# Patient Record
Sex: Female | Born: 1975 | Race: Asian | Hispanic: No | Marital: Married | Smoking: Never smoker
Health system: Southern US, Community
[De-identification: ages and names within clinical notes are randomized; demographics above are authoritative.]

## PROBLEM LIST (undated history)

## (undated) DIAGNOSIS — I10 Essential (primary) hypertension: Secondary | ICD-10-CM

## (undated) HISTORY — DX: Essential (primary) hypertension: I10

---

## 2016-06-12 ENCOUNTER — Inpatient Hospital Stay (HOSPITAL_COMMUNITY): Payer: Self-pay

## 2016-06-12 ENCOUNTER — Encounter (HOSPITAL_COMMUNITY): Payer: Self-pay | Admitting: Emergency Medicine

## 2016-06-12 ENCOUNTER — Emergency Department (HOSPITAL_COMMUNITY): Payer: Self-pay

## 2016-06-12 ENCOUNTER — Inpatient Hospital Stay (HOSPITAL_COMMUNITY)
Admission: EM | Admit: 2016-06-12 | Discharge: 2016-06-17 | DRG: 291 | Disposition: A | Discharge status: Home / Self Care | Payer: Self-pay | Attending: Internal Medicine | Admitting: Internal Medicine

## 2016-06-12 DIAGNOSIS — R32 Unspecified urinary incontinence: Secondary | ICD-10-CM | POA: Diagnosis present

## 2016-06-12 DIAGNOSIS — Z9114 Patient's other noncompliance with medication regimen: Secondary | ICD-10-CM

## 2016-06-12 DIAGNOSIS — I509 Heart failure, unspecified: Secondary | ICD-10-CM

## 2016-06-12 DIAGNOSIS — I429 Cardiomyopathy, unspecified: Secondary | ICD-10-CM | POA: Diagnosis present

## 2016-06-12 DIAGNOSIS — I161 Hypertensive emergency: Secondary | ICD-10-CM | POA: Diagnosis present

## 2016-06-12 DIAGNOSIS — I10 Essential (primary) hypertension: Secondary | ICD-10-CM | POA: Diagnosis present

## 2016-06-12 DIAGNOSIS — I493 Ventricular premature depolarization: Secondary | ICD-10-CM

## 2016-06-12 DIAGNOSIS — I130 Hypertensive heart and chronic kidney disease with heart failure and stage 1 through stage 4 chronic kidney disease, or unspecified chronic kidney disease: Principal | ICD-10-CM | POA: Diagnosis present

## 2016-06-12 DIAGNOSIS — J810 Acute pulmonary edema: Secondary | ICD-10-CM | POA: Diagnosis present

## 2016-06-12 DIAGNOSIS — I5021 Acute systolic (congestive) heart failure: Secondary | ICD-10-CM | POA: Diagnosis present

## 2016-06-12 DIAGNOSIS — T502X5A Adverse effect of carbonic-anhydrase inhibitors, benzothiadiazides and other diuretics, initial encounter: Secondary | ICD-10-CM | POA: Diagnosis not present

## 2016-06-12 DIAGNOSIS — R778 Other specified abnormalities of plasma proteins: Secondary | ICD-10-CM | POA: Diagnosis present

## 2016-06-12 DIAGNOSIS — N179 Acute kidney failure, unspecified: Secondary | ICD-10-CM | POA: Diagnosis present

## 2016-06-12 DIAGNOSIS — I427 Cardiomyopathy due to drug and external agent: Secondary | ICD-10-CM

## 2016-06-12 DIAGNOSIS — N189 Chronic kidney disease, unspecified: Secondary | ICD-10-CM | POA: Diagnosis present

## 2016-06-12 DIAGNOSIS — Z6841 Body Mass Index (BMI) 40.0 and over, adult: Secondary | ICD-10-CM

## 2016-06-12 DIAGNOSIS — N19 Unspecified kidney failure: Secondary | ICD-10-CM

## 2016-06-12 DIAGNOSIS — J9601 Acute respiratory failure with hypoxia: Secondary | ICD-10-CM | POA: Diagnosis present

## 2016-06-12 LAB — CBC WITH DIFFERENTIAL/PLATELET
BASOS PCT: 0 %
BASOS PCT: 0 %
Basophils Absolute: 0 10*3/uL (ref 0.0–0.1)
Basophils Absolute: 0 10*3/uL (ref 0.0–0.1)
EOS ABS: 0.5 10*3/uL (ref 0.0–0.7)
EOS ABS: 0 10*3/uL (ref 0.0–0.7)
EOS PCT: 0 %
Eosinophils Relative: 2 %
HCT: 52.7 % — ABNORMAL HIGH (ref 36.0–46.0)
HCT: 45.3 % (ref 36.0–46.0)
HEMOGLOBIN: 17.2 g/dL — AB (ref 12.0–15.0)
HEMOGLOBIN: 15.1 g/dL — AB (ref 12.0–15.0)
LYMPHS ABS: 10.7 10*3/uL — AB (ref 0.7–4.0)
LYMPHS ABS: 1.6 10*3/uL (ref 0.7–4.0)
LYMPHS PCT: 48 %
Lymphocytes Relative: 10 %
MCH: 30.3 pg (ref 26.0–34.0)
MCH: 30.3 pg (ref 26.0–34.0)
MCHC: 32.6 g/dL (ref 30.0–36.0)
MCHC: 33.3 g/dL (ref 30.0–36.0)
MCV: 92.9 fL (ref 78.0–100.0)
MCV: 91 fL (ref 78.0–100.0)
Monocytes Absolute: 1.4 10*3/uL — ABNORMAL HIGH (ref 0.1–1.0)
Monocytes Absolute: 0.8 10*3/uL (ref 0.1–1.0)
Monocytes Relative: 6 %
Monocytes Relative: 5 %
NEUTROS ABS: 9.9 10*3/uL — AB (ref 1.7–7.7)
NEUTROS PCT: 84 %
Neutro Abs: 13.1 10*3/uL — ABNORMAL HIGH (ref 1.7–7.7)
Neutrophils Relative %: 44 %
PLATELETS: 296 10*3/uL (ref 150–400)
Platelets: 405 10*3/uL — ABNORMAL HIGH (ref 150–400)
RBC: 5.67 MIL/uL — ABNORMAL HIGH (ref 3.87–5.11)
RBC: 4.98 MIL/uL (ref 3.87–5.11)
RDW: 14.1 % (ref 11.5–15.5)
RDW: 14.1 % (ref 11.5–15.5)
WBC: 22.5 10*3/uL — ABNORMAL HIGH (ref 4.0–10.5)
WBC: 15.6 10*3/uL — AB (ref 4.0–10.5)

## 2016-06-12 LAB — COMPREHENSIVE METABOLIC PANEL
ALBUMIN: 3.5 g/dL (ref 3.5–5.0)
ALK PHOS: 89 U/L (ref 38–126)
ALK PHOS: 68 U/L (ref 38–126)
ALT: 18 U/L (ref 14–54)
ALT: 15 U/L (ref 14–54)
AST: 25 U/L (ref 15–41)
AST: 20 U/L (ref 15–41)
Albumin: 4.3 g/dL (ref 3.5–5.0)
Anion gap: 11 (ref 5–15)
Anion gap: 9 (ref 5–15)
BUN: 18 mg/dL (ref 6–20)
BUN: 19 mg/dL (ref 6–20)
CALCIUM: 8.8 mg/dL — AB (ref 8.9–10.3)
CALCIUM: 8.5 mg/dL — AB (ref 8.9–10.3)
CHLORIDE: 104 mmol/L (ref 101–111)
CO2: 24 mmol/L (ref 22–32)
CO2: 25 mmol/L (ref 22–32)
CREATININE: 1.4 mg/dL — AB (ref 0.44–1.00)
CREATININE: 1.31 mg/dL — AB (ref 0.44–1.00)
Chloride: 102 mmol/L (ref 101–111)
GFR calc non Af Amer: 49 mL/min — ABNORMAL LOW (ref >60)
GFR, EST AFRICAN AMERICAN: 53 mL/min — AB (ref >60)
GFR, EST AFRICAN AMERICAN: 57 mL/min — AB (ref >60)
GFR, EST NON AFRICAN AMERICAN: 46 mL/min — AB (ref >60)
GLUCOSE: 123 mg/dL — AB (ref 65–99)
Glucose, Bld: 215 mg/dL — ABNORMAL HIGH (ref 65–99)
Potassium: 3.5 mmol/L (ref 3.5–5.1)
Potassium: 3.6 mmol/L (ref 3.5–5.1)
SODIUM: 138 mmol/L (ref 135–145)
Sodium: 137 mmol/L (ref 135–145)
Total Bilirubin: 0.6 mg/dL (ref 0.3–1.2)
Total Bilirubin: 0.6 mg/dL (ref 0.3–1.2)
Total Protein: 9.1 g/dL — ABNORMAL HIGH (ref 6.5–8.1)
Total Protein: 7.2 g/dL (ref 6.5–8.1)

## 2016-06-12 LAB — ECHOCARDIOGRAM COMPLETE
HEIGHTINCHES: 67 in
WEIGHTICAEL: 3576.74 [oz_av]

## 2016-06-12 LAB — BASIC METABOLIC PANEL
Anion gap: 10 (ref 5–15)
BUN: 19 mg/dL (ref 6–20)
CHLORIDE: 101 mmol/L (ref 101–111)
CO2: 27 mmol/L (ref 22–32)
CREATININE: 1.19 mg/dL — AB (ref 0.44–1.00)
Calcium: 8.3 mg/dL — ABNORMAL LOW (ref 8.9–10.3)
GFR calc Af Amer: >60 mL/min (ref >60)
GFR calc non Af Amer: 55 mL/min — ABNORMAL LOW (ref >60)
GLUCOSE: 118 mg/dL — AB (ref 65–99)
POTASSIUM: 3.1 mmol/L — AB (ref 3.5–5.1)
SODIUM: 138 mmol/L (ref 135–145)

## 2016-06-12 LAB — I-STAT CHEM 8, ED
BUN: 20 mg/dL (ref 6–20)
CALCIUM ION: 1.1 mmol/L — AB (ref 1.13–1.30)
CREATININE: 1.3 mg/dL — AB (ref 0.44–1.00)
Chloride: 100 mmol/L — ABNORMAL LOW (ref 101–111)
Glucose, Bld: 219 mg/dL — ABNORMAL HIGH (ref 65–99)
HCT: 58 % — ABNORMAL HIGH (ref 36.0–46.0)
HEMOGLOBIN: 19.7 g/dL — AB (ref 12.0–15.0)
Potassium: 3.6 mmol/L (ref 3.5–5.1)
SODIUM: 143 mmol/L (ref 135–145)
TCO2: 25 mmol/L (ref 0–100)

## 2016-06-12 LAB — BLOOD GAS, ARTERIAL
Acid-base deficit: 2 mmol/L (ref 0.0–2.0)
Bicarbonate: 22.8 mEq/L (ref 20.0–24.0)
Delivery systems: POSITIVE
Drawn by: 11249
FIO2: 40
LHR: 14 {breaths}/min
MODE: POSITIVE
O2 SAT: 93.6 %
PATIENT TEMPERATURE: 37.8
PCO2 ART: 43 mmHg (ref 35.0–45.0)
PEEP/CPAP: 5 cmH2O
PH ART: 7.349 — AB (ref 7.350–7.450)
Pressure control: 20 cmH2O
TCO2: 19.9 mmol/L (ref 0–100)
pO2, Arterial: 80.4 mmHg (ref 80.0–100.0)

## 2016-06-12 LAB — LACTIC ACID, PLASMA: LACTIC ACID, VENOUS: 1.9 mmol/L (ref 0.5–1.9)

## 2016-06-12 LAB — I-STAT TROPONIN, ED: TROPONIN I, POC: 0.07 ng/mL (ref 0.00–0.08)

## 2016-06-12 LAB — TROPONIN I
TROPONIN I: 0.28 ng/mL — AB (ref <0.03)
Troponin I: 0.37 ng/mL (ref <0.03)
Troponin I: 0.33 ng/mL (ref <0.03)

## 2016-06-12 LAB — BRAIN NATRIURETIC PEPTIDE
B NATRIURETIC PEPTIDE 5: 1340 pg/mL — AB (ref 0.0–100.0)
B Natriuretic Peptide: 1413.4 pg/mL — ABNORMAL HIGH (ref 0.0–100.0)

## 2016-06-12 LAB — MAGNESIUM: Magnesium: 1.5 mg/dL — ABNORMAL LOW (ref 1.7–2.4)

## 2016-06-12 LAB — I-STAT BETA HCG BLOOD, ED (MC, WL, AP ONLY): I-stat hCG, quantitative: <5 m[IU]/mL (ref <5)

## 2016-06-12 LAB — PROCALCITONIN: PROCALCITONIN: 0.52 ng/mL

## 2016-06-12 LAB — MRSA PCR SCREENING: MRSA BY PCR: NEGATIVE

## 2016-06-12 LAB — PATHOLOGIST SMEAR REVIEW

## 2016-06-12 LAB — PHOSPHORUS: PHOSPHORUS: 3.1 mg/dL (ref 2.5–4.6)

## 2016-06-12 MED ORDER — AMLODIPINE BESYLATE 5 MG PO TABS
5.0000 mg | ORAL_TABLET | Freq: Every day | ORAL | Status: DC
  Administered 2016-06-12: 5 mg via ORAL
  Filled 2016-06-12: qty 1

## 2016-06-12 MED ORDER — FUROSEMIDE 10 MG/ML IJ SOLN: 40.0000 mg | Freq: Once | INTRAMUSCULAR | Status: DC

## 2016-06-12 MED ORDER — NICARDIPINE HCL IN NACL 20-0.86 MG/200ML-% IV SOLN
3.0000 mg/h | INTRAVENOUS | Status: DC
  Administered 2016-06-12: 1 mg/h via INTRAVENOUS
  Filled 2016-06-12 (×2): qty 200

## 2016-06-12 MED ORDER — HYDRALAZINE HCL 25 MG PO TABS
25.0000 mg | ORAL_TABLET | Freq: Four times a day (QID) | ORAL | Status: DC
  Administered 2016-06-12 – 2016-06-13 (×3): 25 mg via ORAL
  Filled 2016-06-12 (×3): qty 1

## 2016-06-12 MED ORDER — NITROGLYCERIN IN D5W 200-5 MCG/ML-% IV SOLN
0.0000 ug/min | INTRAVENOUS | Status: DC
  Administered 2016-06-12: 10 ug/min via INTRAVENOUS

## 2016-06-12 MED ORDER — FUROSEMIDE 10 MG/ML IJ SOLN
INTRAMUSCULAR | Status: AC
  Filled 2016-06-12: qty 4

## 2016-06-12 MED ORDER — FUROSEMIDE 10 MG/ML IJ SOLN
80.0000 mg | Freq: Four times a day (QID) | INTRAMUSCULAR | Status: DC
  Administered 2016-06-12: 80 mg via INTRAVENOUS
  Filled 2016-06-12: qty 8

## 2016-06-12 MED ORDER — SODIUM CHLORIDE 0.9 % IV SOLN
250.0000 mL | INTRAVENOUS | Status: DC | PRN
  Administered 2016-06-12: 250 mL via INTRAVENOUS

## 2016-06-12 MED ORDER — NICARDIPINE HCL IN NACL 20-0.86 MG/200ML-% IV SOLN
3.0000 mg/h | INTRAVENOUS | Status: AC
  Administered 2016-06-12: 3 mg/h via INTRAVENOUS
  Filled 2016-06-12: qty 200

## 2016-06-12 MED ORDER — AMLODIPINE BESYLATE 5 MG PO TABS: 5.0000 mg | ORAL_TABLET | Freq: Every day | ORAL | Status: DC

## 2016-06-12 MED ORDER — FUROSEMIDE 10 MG/ML IJ SOLN
80.0000 mg | Freq: Once | INTRAMUSCULAR | Status: AC
  Administered 2016-06-12: 80 mg via INTRAVENOUS

## 2016-06-12 MED ORDER — AMLODIPINE BESYLATE 10 MG PO TABS
10.0000 mg | ORAL_TABLET | Freq: Every day | ORAL | Status: DC
  Administered 2016-06-13 – 2016-06-14 (×2): 10 mg via ORAL
  Filled 2016-06-12 (×2): qty 1

## 2016-06-12 MED ORDER — FUROSEMIDE 10 MG/ML IJ SOLN
80.0000 mg | Freq: Two times a day (BID) | INTRAMUSCULAR | Status: DC
  Administered 2016-06-13: 80 mg via INTRAVENOUS
  Filled 2016-06-12: qty 8

## 2016-06-12 MED ORDER — NITROGLYCERIN 2 % TD OINT
1.0000 [in_us] | TOPICAL_OINTMENT | Freq: Once | TRANSDERMAL | Status: AC
  Administered 2016-06-12: 1 [in_us] via TOPICAL
  Filled 2016-06-12: qty 1

## 2016-06-12 MED ORDER — HYDRALAZINE HCL 25 MG PO TABS: 25.0000 mg | ORAL_TABLET | Freq: Four times a day (QID) | ORAL | Status: DC

## 2016-06-12 MED ORDER — NITROGLYCERIN 0.4 MG SL SUBL
0.4000 mg | SUBLINGUAL_TABLET | SUBLINGUAL | Status: DC | PRN
  Administered 2016-06-12 (×2): 0.4 mg via SUBLINGUAL
  Filled 2016-06-12 (×2): qty 1

## 2016-06-12 MED ORDER — ENOXAPARIN SODIUM 40 MG/0.4ML ~~LOC~~ SOLN
40.0000 mg | SUBCUTANEOUS | Status: DC
  Administered 2016-06-12 – 2016-06-13 (×2): 40 mg via SUBCUTANEOUS
  Filled 2016-06-12 (×2): qty 0.4

## 2016-06-12 MED ORDER — AMLODIPINE BESYLATE 5 MG PO TABS
5.0000 mg | ORAL_TABLET | Freq: Once | ORAL | Status: AC
  Administered 2016-06-12: 5 mg via ORAL
  Filled 2016-06-12: qty 1

## 2016-06-12 MED ORDER — HYDRALAZINE HCL 10 MG PO TABS
10.0000 mg | ORAL_TABLET | Freq: Four times a day (QID) | ORAL | Status: DC
  Administered 2016-06-12: 10 mg via ORAL
  Filled 2016-06-12: qty 1

## 2016-06-12 MED ORDER — HYDRALAZINE HCL 20 MG/ML IJ SOLN
5.0000 mg | Freq: Four times a day (QID) | INTRAMUSCULAR | Status: DC | PRN
  Administered 2016-06-12: 5 mg via INTRAVENOUS
  Filled 2016-06-12: qty 1

## 2016-06-12 MED ORDER — FUROSEMIDE 10 MG/ML IJ SOLN
40.0000 mg | Freq: Once | INTRAMUSCULAR | Status: AC
  Administered 2016-06-12: 40 mg via INTRAVENOUS

## 2016-06-12 MED ORDER — FUROSEMIDE 10 MG/ML IJ SOLN
INTRAMUSCULAR | Status: AC
  Administered 2016-06-12: 80 mg via INTRAVENOUS
  Filled 2016-06-12: qty 8

## 2016-06-12 MED ORDER — NITROGLYCERIN IN D5W 200-5 MCG/ML-% IV SOLN
INTRAVENOUS | Status: AC
  Filled 2016-06-12: qty 250

## 2016-06-12 NOTE — Progress Notes (Signed)
  Echocardiogram 2D Echocardiogram has been performed.  Mandy Serrano 06/12/2016, 4:18 PM

## 2016-06-12 NOTE — Progress Notes (Signed)
S:  Net negative 1.2L since admit.  BP control improved on cardene gtt.  Pt reports feeling like she needs to cough up secretions (has not yet).  Denies fevers, chills, n/v, chest pain, sinus drainage   O:  Blood pressure (!) 154/81, pulse 88, temperature 98.1 F (36.7 C), temperature source Oral, resp. rate 18, height 5\' 7"  (1.702 m), weight 223 lb 8.7 oz (101.4 kg), SpO2 98 %.   General: well developed female in NAD Neuro: AAOx4, speech clear, MAE  CV: s1s2 rrr, no m/r/g  PULM: non-labored, lungs bilaterally clear  GI: obese / soft, bsx4 active, tolerating PO's  Extremities:  Warm/dry, trace LE edema     Recent Labs Lab 06/12/16 0100 06/12/16 0110 06/12/16 0711  HGB 17.2* 19.7* 15.1*  HCT 52.7* 58.0* 45.3  WBC 22.5*  --  15.6*  PLT 405*  --  296    Recent Labs Lab 06/12/16 0100 06/12/16 0110 06/12/16 0711  NA 137 143 138  K 3.5 3.6 3.6  CL 102 100* 104  CO2 24  --  25  GLUCOSE 215* 219* 123*  BUN 18 20 19   CREATININE 1.40* 1.30* 1.31*  CALCIUM 8.8*  --  8.5*  MG  --   --  1.5*  PHOS  --   --  3.1   BNP 1413  PCT 0.37  Lactic Acid 1.9     Recent Labs Lab 06/12/16 0711  TROPONINI 0.37*    A:  Acute Hypoxic Respiratory Failure  Pulmonary Edema  R/O Pulmonary Infection  Hypertensive Emergency  Non-Compliance with HTN Regimen  Cardiomyopathy  AKI - in setting of hypertensive emergency   P:  Improving BP control on cardene gtt, 1.5 mg /hr currently  SBP goal 120-150 Increase amlodipine to 10mg  QD  Increase hydralazine to 25 mg Q6 Will need medications on 4$ list if possible as she reports she is uninsured  Lasix 80 Q12 x2 doses Repeat BMP at 1400 > eval K and sr cr.  May need to cancel 2300 dose lasix pending review  Trend troponin  Await UDS  Pulmonary hygiene - IS, mobilize Follow up CXR in am to r/o developing infiltrate  Transfer to [facility] as of 8/22.  PCCM will sign off. Please call back if new needs arise.   Canary Brim, NP-C [place]  [facility] Pgr: [phone] or if no answer [phone] 06/12/2016, 11:51 AM

## 2016-06-12 NOTE — ED Notes (Signed)
Started 20 min timer 5:30 am

## 2016-06-12 NOTE — H&P (Addendum)
PULMONARY / CRITICAL CARE MEDICINE   Name: Mandy Serrano MRN: [id number] DOB: 1976-06-13    ADMISSION DATE:  06/12/2016 CONSULTATION DATE:  06/12/16  REFERRING MD:  Francis Gaines MD, EDP  CHIEF COMPLAINT:  Respiratory failure, hypertensive emergency.  HISTORY OF PRESENT ILLNESS:   [name] is a 40 year old with past medical history of hypertension. She is admitted today to the ED with acute onset of dyspnea for 1 day. In the ED she had bilateral crackles with pulmonary edema on chest x-ray. Systolic blood pressures in the 250s. She was given Lasix 80 mg +40 mg but made only 400cc urine but also had an episode of incontinence. She was given nitro paste, SL nitro and then started on nitroglycerin and nicardipine drip and placed on BiPAP. PCCM called for admission.  She has history of hypertension and is on Amlodipine, captopril, Lasix and hydralazine at home. She is not sure of the doses and has not seen a doctor since her immigration from [place] one year ago. She ran out of some of her blood pressure medications (amlodipine, captopril) one month ago.  PAST MEDICAL HISTORY :  She  has no past medical history on file.  PAST SURGICAL HISTORY: She  has no past surgical history on file.  Not on File  No current facility-administered medications on file prior to encounter.    No current outpatient prescriptions on file prior to encounter.    FAMILY HISTORY:  Her has no family status information on file.    SOCIAL HISTORY: She    REVIEW OF SYSTEMS:   Dyspnea. Denies any cough, sputum production, wheezing, hemoptysis. Denies any fevers, chills, malaise, loss of fatigue, loss of appetite. Denies any nausea, vomiting, diarrhea, constipation. Denies any chest pain, palpitation. All other review of systems are negative  SUBJECTIVE:   VITAL SIGNS: BP 129/92   Pulse 103   Temp 99.5 F (37.5 C)   Resp 19   Ht 5\' 2"  (1.575 m)   Wt 230 lb (104.3 kg)   LMP  (LMP Unknown)   SpO2  94%   BMI 42.07 kg/m   HEMODYNAMICS:    VENTILATOR SETTINGS: Vent Mode: BIPAP;PCV FiO2 (%):  [40 %] 40 % Set Rate:  [14 bmp-15 bmp] 14 bmp PEEP:  [5 cmH20] 5 cmH20  INTAKE / OUTPUT: No intake/output data recorded.  PHYSICAL EXAMINATION: General:  No distress Neuro:  No focal deficits HEENT:  PERRLA, EOMI, no thyromegaly, JVD Cardiovascular:  RRR, No MRG Lungs:  B/L wet rales, No wheezing Abdomen:  Soft, + BS, non tender, non distended Musculoskeletal:  Normal tone and bulk, no edema Skin:  Intact  LABS:  BMET  Recent Labs Lab 06/12/16 0100 06/12/16 0110  NA 137 143  K 3.5 3.6  CL 102 100*  CO2 24  --   BUN 18 20  CREATININE 1.40* 1.30*  GLUCOSE 215* 219*    Electrolytes  Recent Labs Lab 06/12/16 0100  CALCIUM 8.8*    CBC  Recent Labs Lab 06/12/16 0100 06/12/16 0110  WBC 22.5*  --   HGB 17.2* 19.7*  HCT 52.7* 58.0*  PLT 405*  --     Coag's No results for input(s): APTT, INR in the last 168 hours.  Sepsis Markers No results for input(s): LATICACIDVEN, PROCALCITON, O2SATVEN in the last 168 hours.  ABG  Recent Labs Lab 06/12/16 0234  PHART 7.349*  PCO2ART 43.0  PO2ART 80.4    Liver Enzymes  Recent Labs Lab 06/12/16 0100  AST 25  ALT 18  ALKPHOS 89  BILITOT 0.6  ALBUMIN 4.3    Cardiac Enzymes No results for input(s): TROPONINI, PROBNP in the last 168 hours.  Glucose No results for input(s): GLUCAP in the last 168 hours.  Imaging Dg Chest Portable 1 View  Result Date: 06/12/2016 CLINICAL DATA:  Acute shortness of breath.  Hypoxia and tachycardia. EXAM: PORTABLE CHEST 1 VIEW COMPARISON:  None. FINDINGS: Mild enlargement of cardiac silhouette. Mediastinal contours are normal. There diffuse bilateral pulmonary opacities in the a slightly perihilar predominant distribution. No definite pleural effusion. Probable left basilar atelectasis. No pneumothorax. No acute osseous abnormality is seen. IMPRESSION: Diffuse bilateral  pulmonary opacities, concerning for pulmonary edema versus multifocal pneumonia. Given associated enlargement of cardiac silhouette, pulmonary edema is favored. Electronically Signed   By: Rubye OaksMelanie  Ehinger M.D.   On: 06/12/2016 01:28    STUDIES:   CULTURES:  ANTIBIOTICS:  SIGNIFICANT EVENTS:  LINES/TUBES:  DISCUSSION: 40 year old with history of hypertension admitted with hypertensive emergency, AKI, pulmonary edema, high BNP, respiratory failure due to noncompliance with medications at home.  She has elevated WBC count but no symptoms of an infection, pneumonia. We will continue to treat her blood pressure and start her outpatient medications with aggressive diuresis. Observe off antibiotics unless the pro calcitonin is elevated.  ASSESSMENT / PLAN:  PULMONARY A: Acute hypoxic resp failure Pulmonary edema R/O Pneumonia P:   Take off Bipap and observe Use NIV as needed Follow CXR  CARDIOVASCULAR A:  Hypertensive emergency Non compliance with meds Cardiomyopathy on CXR P:  Check echo Trend trop,  BNP, Lactic acid. Continue nitro and cardene drip. Wean off the nitro first. Start hydralazine and amlodipine PO Aggressive lasix for diuresis.  Check urine drug screen  RENAL A:   AKI P:   Monitor urine output and Cr  GASTROINTESTINAL A:   Stable P:   Keep NPO while she is on Bipap  HEMATOLOGIC A:   Leukocytosis, likely stress P:  Monitor  INFECTIOUS A:   R/O infection P:   Observe off abx Check procalcitonin, lactic acid, cultures  ENDOCRINE A:   No issues   P:    NEUROLOGIC A:   Stable P:    FAMILY  - Updates: Pt updated at bedside - Inter-disciplinary family meet or Palliative Care meeting due by:  06/12/16  Critical care time- 35 mins.  Chilton GreathousePraveen Zeppelin Commisso MD [place] [facility] Pager [phone] If no answer or after 3pm call: [phone] 06/12/2016, 5:14 AM

## 2016-06-12 NOTE — ED Provider Notes (Signed)
[facility]-EMERGENCY DEPT Provider Note   CSN: [id number] Arrival date & time: 06/12/16  0033  By signing my name below, I, Jasmyn B. Alexander, attest that this documentation has been prepared under the direction and in the presence of Jakerria Kingbird, MD. Electronically Signed: Gillis Ends. Lyn Hollingshead, ED Scribe. 06/12/16. 1:37 AM.  History   Chief Complaint Chief Complaint  Patient presents with  . Respiratory Distress   LEVEL V CAVEAT - ACUITY OF CONDITION  HPI HPI Comments: Marilyne Tienda is a 40 y.o. female who presents to the Emergency Department complaining of sudden onset, severe respiratory distress. Per pt's family member, current episode has been happening for "some time now." Pt is unable to give history due to acuity of condition and limited [place]. She is from [place]. Hx of cardiac issues. LNMP was last week.   The history is provided by a relative. The history is limited by the condition of the patient. No language interpreter was used.  Shortness of Breath  The problem occurs continuously.The current episode started less than 1 hour ago. The problem has been rapidly worsening. Pertinent negatives include no fever and no syncope. She has tried nothing for the symptoms. The treatment provided no relief. Associated medical issues do not include recent surgery.    History reviewed. No pertinent past medical history.  There are no active problems to display for this patient.  History reviewed. No pertinent surgical history.  OB History    No data available      Home Medications    Prior to Admission medications   Not on File    Family History No family history on file.  Social History Social History  Substance Use Topics  . Smoking status: Not on file  . Smokeless tobacco: Not on file  . Alcohol use Not on file    Allergies   Review of patient's allergies indicates not on file.   Review of Systems Review of Systems  Unable to perform ROS: Acuity of  condition  Constitutional: Negative for fever.  Respiratory: Positive for shortness of breath.   Cardiovascular: Negative for syncope.   Physical Exam Updated Vital Signs BP 128/97   Pulse (!) 55   Temp 100 F (37.8 C)   Resp (!) 28   Ht 5\' 2"  (1.575 m)   Wt 230 lb (104.3 kg)   LMP  (LMP Unknown)   SpO2 94%   BMI 42.07 kg/m   Physical Exam  Constitutional: She is oriented to person, place, and time. She appears well-developed and well-nourished. She appears distressed.  HENT:  Head: Normocephalic and atraumatic.  Nose: Nose normal.  Mouth/Throat: Oropharynx is clear and moist. No oropharyngeal exudate.  Moist mucous membranes   Eyes: Conjunctivae are normal. Pupils are equal, round, and reactive to light.  Neck: Normal range of motion. Neck supple.  Trachea midline No bruit  Cardiovascular: Regular rhythm, normal heart sounds and intact distal pulses.  Tachycardia present.   Pulmonary/Chest: Accessory muscle usage present. No stridor. Tachypnea noted. She is in respiratory distress. She has rales. She exhibits no tenderness.  Rales and rhonchi noted in all fields  Abdominal: Soft. Bowel sounds are normal. She exhibits no distension.  Musculoskeletal: Normal range of motion. She exhibits no edema.  Neurological: She is alert and oriented to person, place, and time. She has normal reflexes.  Skin: Skin is warm and dry. Capillary refill takes less than 2 seconds.  Psychiatric: Her behavior is normal.  Nursing note and vitals reviewed.  ED Treatments / Results  Labs (all labs ordered are listed, but only abnormal results are displayed) Labs Reviewed  CBC WITH DIFFERENTIAL/PLATELET - Abnormal; Notable for the following:       Result Value   WBC 22.5 (*)    RBC 5.67 (*)    Hemoglobin 17.2 (*)    HCT 52.7 (*)    Platelets 405 (*)    Neutro Abs 9.9 (*)    Lymphs Abs 10.7 (*)    Monocytes Absolute 1.4 (*)    All other components within normal limits  COMPREHENSIVE  METABOLIC PANEL - Abnormal; Notable for the following:    Glucose, Bld 215 (*)    Creatinine, Ser 1.40 (*)    Calcium 8.8 (*)    Total Protein 9.1 (*)    GFR calc non Af Amer 46 (*)    GFR calc Af Amer 53 (*)    All other components within normal limits  BRAIN NATRIURETIC PEPTIDE - Abnormal; Notable for the following:    B Natriuretic Peptide 1,340.0 (*)    All other components within normal limits  BLOOD GAS, ARTERIAL - Abnormal; Notable for the following:    pH, Arterial 7.349 (*)    All other components within normal limits  I-STAT CHEM 8, ED - Abnormal; Notable for the following:    Chloride 100 (*)    Creatinine, Ser 1.30 (*)    Glucose, Bld 219 (*)    Calcium, Ion 1.10 (*)    Hemoglobin 19.7 (*)    HCT 58.0 (*)    All other components within normal limits  I-STAT BETA HCG BLOOD, ED ([facility], [facility], [facility] ONLY)  I-STAT TROPOININ, ED    EKG  EKG Interpretation  Date/Time:  Monday June 12 2016 01:00:41 EDT Ventricular Rate:  150 PR Interval:    QRS Duration: 100 QT Interval:  275 QTC Calculation: 435 R Axis:   -56 Text Interpretation:  Sinus tachycardia LAE, consider biatrial enlargement Left ventricular hypertrophy Confirmed by [facility]  MD, Jamell Laymon ([id number]) on 06/12/2016 1:21:16 AM       Radiology Dg Chest Portable 1 View  Result Date: 06/12/2016 CLINICAL DATA:  Acute shortness of breath.  Hypoxia and tachycardia. EXAM: PORTABLE CHEST 1 VIEW COMPARISON:  None. FINDINGS: Mild enlargement of cardiac silhouette. Mediastinal contours are normal. There diffuse bilateral pulmonary opacities in the a slightly perihilar predominant distribution. No definite pleural effusion. Probable left basilar atelectasis. No pneumothorax. No acute osseous abnormality is seen. IMPRESSION: Diffuse bilateral pulmonary opacities, concerning for pulmonary edema versus multifocal pneumonia. Given associated enlargement of cardiac silhouette, pulmonary edema is favored. Electronically Signed   By:  Rubye Oaks M.D.   On: 06/12/2016 01:28    Procedures Procedures (including critical care time)  Medications Ordered in ED Medications  nitroGLYCERIN (NITROSTAT) SL tablet 0.4 mg (not administered)  nitroGLYCERIN 50 mg in dextrose 5 % 250 mL (0.2 mg/mL) infusion (10 mcg/min Intravenous Rate/Dose Change 06/12/16 0231)  nitroGLYCERIN (NITROGLYN) 2 % ointment 1 inch (1 inch Topical Given 06/12/16 0058)  furosemide (LASIX) injection 80 mg (80 mg Intravenous Given 06/12/16 0054)  furosemide (LASIX) injection 40 mg (40 mg Intravenous Given 06/12/16 0119)  nicardipine (CARDENE) 20mg  in 0.86% saline IV infusion (0.1 mg/ml) (3 mg/hr Intravenous New Bag/Given 06/12/16 0157)   Initial Impression / Assessment and Plan / ED Course  I have reviewed the triage vital signs and the nursing notes.  Pertinent labs & imaging results that were available during my care of the patient  were reviewed by me and considered in my medical decision making (see chart for details).  Clinical Course    Vitals:   06/12/16 0402 06/12/16 0415  BP: 118/96 129/92  Pulse: 99 99  Resp:    Temp: 99.7 F (37.6 C) 99.7 F (37.6 C)    EKG Interpretation  Date/Time:  Monday June 12 2016 01:00:41 EDT Ventricular Rate:  150 PR Interval:    QRS Duration: 100 QT Interval:  275 QTC Calculation: 435 R Axis:   -56 Text Interpretation:  Sinus tachycardia LAE, consider biatrial enlargement Left ventricular hypertrophy Confirmed by [facility]  MD, Morene Antu ([id number]) on 06/12/2016 1:21:16 AM      Results for orders placed or performed during the hospital encounter of 06/12/16  CBC with Differential/Platelet  Result Value Ref Range   WBC 22.5 (H) 4.0 - 10.5 K/uL   RBC 5.67 (H) 3.87 - 5.11 MIL/uL   Hemoglobin 17.2 (H) 12.0 - 15.0 g/dL   HCT 60.4 (H) 54.0 - 98.1 %   MCV 92.9 78.0 - 100.0 fL   MCH 30.3 26.0 - 34.0 pg   MCHC 32.6 30.0 - 36.0 g/dL   RDW 19.1 47.8 - 29.5 %   Platelets 405 (H) 150 - 400 K/uL    Neutrophils Relative % 44 %   Lymphocytes Relative 48 %   Monocytes Relative 6 %   Eosinophils Relative 2 %   Basophils Relative 0 %   Neutro Abs 9.9 (H) 1.7 - 7.7 K/uL   Lymphs Abs 10.7 (H) 0.7 - 4.0 K/uL   Monocytes Absolute 1.4 (H) 0.1 - 1.0 K/uL   Eosinophils Absolute 0.5 0.0 - 0.7 K/uL   Basophils Absolute 0.0 0.0 - 0.1 K/uL   WBC Morphology ABSOLUTE LYMPHOCYTOSIS   Comprehensive metabolic panel  Result Value Ref Range   Sodium 137 135 - 145 mmol/L   Potassium 3.5 3.5 - 5.1 mmol/L   Chloride 102 101 - 111 mmol/L   CO2 24 22 - 32 mmol/L   Glucose, Bld 215 (H) 65 - 99 mg/dL   BUN 18 6 - 20 mg/dL   Creatinine, Ser 6.21 (H) 0.44 - 1.00 mg/dL   Calcium 8.8 (L) 8.9 - 10.3 mg/dL   Total Protein 9.1 (H) 6.5 - 8.1 g/dL   Albumin 4.3 3.5 - 5.0 g/dL   AST 25 15 - 41 U/L   ALT 18 14 - 54 U/L   Alkaline Phosphatase 89 38 - 126 U/L   Total Bilirubin 0.6 0.3 - 1.2 mg/dL   GFR calc non Af Amer 46 (L) >60 mL/min   GFR calc Af Amer 53 (L) >60 mL/min   Anion gap 11 5 - 15  Brain natriuretic peptide  Result Value Ref Range   B Natriuretic Peptide 1,340.0 (H) 0.0 - 100.0 pg/mL  Blood gas, arterial  Result Value Ref Range   FIO2 40.00    Delivery systems BILEVEL POSITIVE AIRWAY PRESSURE    Mode BILEVEL POSITIVE AIRWAY PRESSURE    LHR 14 resp/min   Peep/cpap 5.0 cm H20   Pressure control 20 cm H20   pH, Arterial 7.349 (L) 7.350 - 7.450   pCO2 arterial 43.0 35.0 - 45.0 mmHg   pO2, Arterial 80.4 80.0 - 100.0 mmHg   Bicarbonate 22.8 20.0 - 24.0 mEq/L   TCO2 19.9 0 - 100 mmol/L   Acid-base deficit 2.0 0.0 - 2.0 mmol/L   O2 Saturation 93.6 %   Patient temperature 37.8    Collection site RIGHT RADIAL  Drawn by [phone]    Sample type ARTERIAL DRAW    Allens test (pass/fail) PASS PASS  I-stat chem 8, ed  Result Value Ref Range   Sodium 143 135 - 145 mmol/L   Potassium 3.6 3.5 - 5.1 mmol/L   Chloride 100 (L) 101 - 111 mmol/L   BUN 20 6 - 20 mg/dL   Creatinine, Ser 1.911.30 (H) 0.44 - 1.00  mg/dL   Glucose, Bld 478219 (H) 65 - 99 mg/dL   Calcium, Ion 2.951.10 (L) 1.13 - 1.30 mmol/L   TCO2 25 0 - 100 mmol/L   Hemoglobin 19.7 (H) 12.0 - 15.0 g/dL   HCT 62.158.0 (H) 30.836.0 - 65.746.0 %  I-Stat Beta hCG blood, ED ([facility], [facility], [facility] only)  Result Value Ref Range   I-stat hCG, quantitative <5.0 <5 mIU/mL   Comment 3          I-stat troponin, ED  Result Value Ref Range   Troponin i, poc 0.07 0.00 - 0.08 ng/mL   Comment 3           Dg Chest Portable 1 View  Result Date: 06/12/2016 CLINICAL DATA:  Acute shortness of breath.  Hypoxia and tachycardia. EXAM: PORTABLE CHEST 1 VIEW COMPARISON:  None. FINDINGS: Mild enlargement of cardiac silhouette. Mediastinal contours are normal. There diffuse bilateral pulmonary opacities in the a slightly perihilar predominant distribution. No definite pleural effusion. Probable left basilar atelectasis. No pneumothorax. No acute osseous abnormality is seen. IMPRESSION: Diffuse bilateral pulmonary opacities, concerning for pulmonary edema versus multifocal pneumonia. Given associated enlargement of cardiac silhouette, pulmonary edema is favored. Electronically Signed   By: Rubye OaksMelanie  Ehinger M.D.   On: 06/12/2016 01:28   MDM Reviewed: nursing note and vitals Interpretation: labs, ECG and x-ray (elevated BNP elevated white count.  Pulmonary edema by me on CXR) Total time providing critical care: > 105 minutes. This excludes time spent performing separately reportable procedures and services. Consults: critical care   Medications  nitroGLYCERIN (NITROSTAT) SL tablet 0.4 mg (not administered)  nitroGLYCERIN 50 mg in dextrose 5 % 250 mL (0.2 mg/mL) infusion (10 mcg/min Intravenous Rate/Dose Change 06/12/16 0231)  nitroGLYCERIN (NITROGLYN) 2 % ointment 1 inch (1 inch Topical Given 06/12/16 0058)  furosemide (LASIX) injection 80 mg (80 mg Intravenous Given 06/12/16 0054)  furosemide (LASIX) injection 40 mg (40 mg Intravenous Given 06/12/16 0119)  nicardipine (CARDENE) 20mg  in 0.86%  saline 200ml IV infusion (0.1 mg/ml) (1.5 mg/hr Intravenous Rate/Dose Change 06/12/16 0252)   BIPAP initiated by me CRITICAL CARE Performed by: Jasmine AwePALUMBO-RASCH,Marnesha Gagen K Total critical care time:120 minutes Critical care time was exclusive of separately billable procedures and treating other patients. Critical care was necessary to treat or prevent imminent or life-threatening deterioration. Critical care was time spent personally by me on the following activities: development of treatment plan with patient and/or surrogate as well as nursing, discussions with consultants, evaluation of patient's response to treatment, examination of patient, obtaining history from patient or surrogate, ordering and performing treatments and interventions, ordering and review of laboratory studies, ordering and review of radiographic studies, pulse oximetry and re-evaluation of patient's condition. Final Clinical Impressions(s) / ED Diagnoses   Final diagnoses:  None    New Prescriptions New Prescriptions   No medications on file   I personally performed the services described in this documentation, which was scribed in my presence. The recorded information has been reviewed and is accurate.       Cy BlamerApril Taris Galindo, MD 06/12/16 0425

## 2016-06-13 ENCOUNTER — Inpatient Hospital Stay (HOSPITAL_COMMUNITY): Payer: Self-pay

## 2016-06-13 ENCOUNTER — Encounter (HOSPITAL_COMMUNITY): Payer: Self-pay

## 2016-06-13 DIAGNOSIS — I420 Dilated cardiomyopathy: Secondary | ICD-10-CM

## 2016-06-13 DIAGNOSIS — I10 Essential (primary) hypertension: Secondary | ICD-10-CM

## 2016-06-13 DIAGNOSIS — N179 Acute kidney failure, unspecified: Secondary | ICD-10-CM

## 2016-06-13 DIAGNOSIS — I5020 Unspecified systolic (congestive) heart failure: Secondary | ICD-10-CM

## 2016-06-13 DIAGNOSIS — E876 Hypokalemia: Secondary | ICD-10-CM

## 2016-06-13 DIAGNOSIS — R7989 Other specified abnormal findings of blood chemistry: Secondary | ICD-10-CM

## 2016-06-13 DIAGNOSIS — N183 Chronic kidney disease, stage 3 (moderate): Secondary | ICD-10-CM

## 2016-06-13 DIAGNOSIS — I110 Hypertensive heart disease with heart failure: Secondary | ICD-10-CM

## 2016-06-13 DIAGNOSIS — I5021 Acute systolic (congestive) heart failure: Secondary | ICD-10-CM

## 2016-06-13 LAB — PROTIME-INR
INR: 1.1
Prothrombin Time: 14.3 seconds (ref 11.4–15.2)

## 2016-06-13 LAB — CBC
HCT: 43.4 % (ref 36.0–46.0)
Hemoglobin: 14.1 g/dL (ref 12.0–15.0)
MCH: 29.7 pg (ref 26.0–34.0)
MCHC: 32.5 g/dL (ref 30.0–36.0)
MCV: 91.4 fL (ref 78.0–100.0)
PLATELETS: 292 10*3/uL (ref 150–400)
RBC: 4.75 MIL/uL (ref 3.87–5.11)
RDW: 14.4 % (ref 11.5–15.5)
WBC: 11.2 10*3/uL — ABNORMAL HIGH (ref 4.0–10.5)

## 2016-06-13 LAB — BASIC METABOLIC PANEL
Anion gap: 8 (ref 5–15)
BUN: 21 mg/dL — AB (ref 6–20)
CALCIUM: 8.3 mg/dL — AB (ref 8.9–10.3)
CO2: 27 mmol/L (ref 22–32)
CREATININE: 1.16 mg/dL — AB (ref 0.44–1.00)
Chloride: 101 mmol/L (ref 101–111)
GFR calc Af Amer: >60 mL/min (ref >60)
GFR, EST NON AFRICAN AMERICAN: 57 mL/min — AB (ref >60)
GLUCOSE: 116 mg/dL — AB (ref 65–99)
Potassium: 3 mmol/L — ABNORMAL LOW (ref 3.5–5.1)
SODIUM: 136 mmol/L (ref 135–145)

## 2016-06-13 LAB — MAGNESIUM: MAGNESIUM: 1.6 mg/dL — AB (ref 1.7–2.4)

## 2016-06-13 LAB — PHOSPHORUS: Phosphorus: 3.7 mg/dL (ref 2.5–4.6)

## 2016-06-13 MED ORDER — ACETAMINOPHEN 325 MG PO TABS: 325.0000 mg | ORAL_TABLET | Freq: Four times a day (QID) | ORAL | Status: DC | PRN

## 2016-06-13 MED ORDER — CARVEDILOL 3.125 MG PO TABS: 3.1250 mg | ORAL_TABLET | Freq: Two times a day (BID) | ORAL | Status: DC

## 2016-06-13 MED ORDER — HYDRALAZINE HCL 20 MG/ML IJ SOLN
10.0000 mg | INTRAMUSCULAR | Status: DC | PRN
  Administered 2016-06-13: 10 mg via INTRAVENOUS
  Filled 2016-06-13: qty 1

## 2016-06-13 MED ORDER — ONDANSETRON HCL 4 MG/2ML IJ SOLN
4.0000 mg | Freq: Four times a day (QID) | INTRAMUSCULAR | Status: DC | PRN
  Administered 2016-06-14: 4 mg via INTRAVENOUS
  Filled 2016-06-13: qty 2

## 2016-06-13 MED ORDER — HEPARIN BOLUS VIA INFUSION
2400.0000 [IU] | Freq: Once | INTRAVENOUS | Status: AC
  Administered 2016-06-13: 2400 [IU] via INTRAVENOUS
  Filled 2016-06-13: qty 2400

## 2016-06-13 MED ORDER — SODIUM CHLORIDE 0.9 % IV SOLN: 250.0000 mL | INTRAVENOUS | Status: DC | PRN

## 2016-06-13 MED ORDER — CARVEDILOL 25 MG PO TABS
25.0000 mg | ORAL_TABLET | Freq: Two times a day (BID) | ORAL | Status: DC
  Administered 2016-06-13 – 2016-06-14 (×3): 25 mg via ORAL
  Filled 2016-06-13: qty 1
  Filled 2016-06-13: qty 2
  Filled 2016-06-13: qty 1

## 2016-06-13 MED ORDER — SODIUM CHLORIDE 0.9% FLUSH: 3.0000 mL | INTRAVENOUS | Status: DC | PRN

## 2016-06-13 MED ORDER — HEPARIN (PORCINE) IN NACL 100-0.45 UNIT/ML-% IJ SOLN
1700.0000 [IU]/h | INTRAMUSCULAR | Status: DC
  Administered 2016-06-14 (×2): 1700 [IU]/h via INTRAVENOUS
  Filled 2016-06-13 (×3): qty 250

## 2016-06-13 MED ORDER — HYDRALAZINE HCL 25 MG PO TABS: 25.0000 mg | ORAL_TABLET | Freq: Three times a day (TID) | ORAL | Status: DC

## 2016-06-13 MED ORDER — HYDRALAZINE HCL 50 MG PO TABS: 50.0000 mg | ORAL_TABLET | Freq: Two times a day (BID) | ORAL | Status: DC

## 2016-06-13 MED ORDER — HYDRALAZINE HCL 50 MG PO TABS
50.0000 mg | ORAL_TABLET | Freq: Three times a day (TID) | ORAL | Status: DC
  Administered 2016-06-13: 50 mg via ORAL
  Filled 2016-06-13: qty 1

## 2016-06-13 MED ORDER — SODIUM CHLORIDE 0.9% FLUSH
3.0000 mL | Freq: Two times a day (BID) | INTRAVENOUS | Status: DC
  Administered 2016-06-13 – 2016-06-17 (×7): 3 mL via INTRAVENOUS

## 2016-06-13 MED ORDER — POTASSIUM CHLORIDE CRYS ER 20 MEQ PO TBCR
40.0000 meq | EXTENDED_RELEASE_TABLET | Freq: Once | ORAL | Status: AC
  Administered 2016-06-13: 40 meq via ORAL
  Filled 2016-06-13: qty 2

## 2016-06-13 MED ORDER — CARVEDILOL 12.5 MG PO TABS: 12.5000 mg | ORAL_TABLET | Freq: Two times a day (BID) | ORAL | Status: DC

## 2016-06-13 MED ORDER — SACUBITRIL-VALSARTAN 49-51 MG PO TABS
1.0000 | ORAL_TABLET | Freq: Two times a day (BID) | ORAL | Status: DC
  Administered 2016-06-13 – 2016-06-14 (×3): 1 via ORAL
  Filled 2016-06-13 (×4): qty 1

## 2016-06-13 MED ORDER — ACETAMINOPHEN 325 MG PO TABS
650.0000 mg | ORAL_TABLET | ORAL | Status: DC | PRN
  Administered 2016-06-13 – 2016-06-14 (×4): 650 mg via ORAL
  Filled 2016-06-13 (×4): qty 2

## 2016-06-13 MED ORDER — HYDROCHLOROTHIAZIDE 25 MG PO TABS
25.0000 mg | ORAL_TABLET | Freq: Every day | ORAL | Status: DC
  Administered 2016-06-13: 25 mg via ORAL
  Filled 2016-06-13: qty 1

## 2016-06-13 MED ORDER — ASPIRIN EC 81 MG PO TBEC
81.0000 mg | DELAYED_RELEASE_TABLET | Freq: Every day | ORAL | Status: DC
  Administered 2016-06-14 – 2016-06-17 (×4): 81 mg via ORAL
  Filled 2016-06-13 (×4): qty 1

## 2016-06-13 NOTE — Progress Notes (Signed)
Preliminary results by tech- Renal Duplex Completed. No evidence of renal artery stenosis noted bilaterally.  Nickia Boesen, BS, RDMS, RVT  

## 2016-06-13 NOTE — Progress Notes (Addendum)
ANTICOAGULATION CONSULT NOTE - Initial Consult  Pharmacy Consult for heparin Indication: left ventricular thrombus  No Known Allergies  Patient Measurements: Height: 5\' 7"  (170.2 cm) Weight: 228 lb 13.4 oz (103.8 kg) IBW/kg (Calculated) : 61.6 Heparin Dosing Weight: 85kg  Vital Signs: Temp: 97.9 F (36.6 C) (08/22 1600) Temp Source: Oral (08/22 1600) BP: 154/126 (08/22 1803) Pulse Rate: 94 (08/22 1803)  Labs:  Recent Labs  06/12/16 0100 06/12/16 0110 06/12/16 0711 06/12/16 1123 06/12/16 1355 06/12/16 1738 06/13/16 0324  HGB 17.2* 19.7* 15.1*  --   --   --  14.1  HCT 52.7* 58.0* 45.3  --   --   --  43.4  PLT 405*  --  296  --   --   --  292  CREATININE 1.40* 1.30* 1.31*  --  1.19*  --  1.16*  TROPONINI  --   --  0.37* 0.33*  --  0.28*  --     Estimated Creatinine Clearance: 78.3 mL/min (by C-G formula based on SCr of 1.16 mg/dL).     Assessment: 40 year old female with past medical history of hypertension and no prior medical history of heart disease who presented to the long TR on 06/12/2016 with acute onset of dyspnea that started a day prior. She was found to have significantly elevated blood pressure and acute renal failure.  Pharmacy consulted to dose heparin for left ventricular thrombus.  06/13/2016 Plts: WNL Scr 1.16, CrCl ~ 46mls/min H/H WNL Received enoxaparin 40mg  today at 0900  Goal of Therapy:  Heparin level 0.3-0.7 units/ml Monitor platelets by anticoagulation protocol  Plan:  Heparin bolus 2400 units x 1 then Heparin drip 1500 units/ hour Check heparin level in 6 hours Daily CBC/heparin level  Arley Phenix RPh 06/13/2016, 6:25 PM Pager [phone]

## 2016-06-13 NOTE — Progress Notes (Addendum)
PROGRESS NOTE    Mandy Serrano  ZOX:[id number] DOB: May 28, 1976 DOA: 06/12/2016 PCP: No PCP Per Patient   Brief Narrative: Hypertensive emergency complicated by cardiogenic pulmonary edema and hypoxic respiratory failure. 40 yo female with long standing hypertension, has been off blood pressure medications for 1 month. Admitted to step down unit, started on nitroglycerin and nicardipine infusion. Aggressive diuresis.    Assessment & Plan:   Active Problems:   Hypertensive emergency   1. Hypertensive emergency. Patient off nitroglycerin and nicardipine drip. Blood pressure still uncontrolled, will continue amlodipine 10 mg, will increase hydralazine to 50 mg tid, and will add diuretic therapy with hctz. Will hold on furosemide for now. Once renal function stable will consider using ace inh.   2. Cardiogenic pulmonary edema. Clinically patient more euvolemic, will continue blood pressure control and target negative fluid balance. Echocardiogram with significant reduction on LV systolic function down to 25 to 30%. With global hypokinesis, with possible left ventricle thrombus. Will consult cardiology, patient may need anticoagulation or TEE for confirmation.  Will start patient on b bloc  3. Hypoxemic respiratory failure. Follow up chest film improved, will continue to monitor oxymetry, will continue supplemental 02 per [place], will hold on furosemide for now and will continue blood pressure control. Keep negative fluid balance, patient has 4050 cc urine output over last 24 hours.   4. AKI. Renal function with stable cr at 1.16 with K at 3.0 and Na at 136. Will hold on furosemide for now and will replete K with kcl. Follow renal panel in am, noted 4050 cc urine out put over last 24 hours. Renal US no signs of renal stenosis.   5. Reactive leukytosis. WBC at 11 from admission at 22. No signs of infection, will continue to hold on antibiotic therapy.   Patient continue to be at high risk for worsening  htn and heart failure.   DVT prophylaxis:  Lovenox Code Status: full Family Communication: no family at the bedside Disposition Plan: home  Consultants:     Procedures:   Antimicrobials:   Subjective: Patient feeling better, dyspnea has improved, no chest pain. No nausea or vomiting. No fever or chills.   Objective: Vitals:   06/13/16 0333 06/13/16 0400 06/13/16 0500 06/13/16 0600  BP:  132/87 (!) 144/88 127/68  Pulse:  75 81 73  Resp:  (!) 21 18 20   Temp:  97.8 F (36.6 C)    TempSrc:  Oral    SpO2:  93% 97% 95%  Weight: 103.8 kg (228 lb 13.4 oz)     Height:        Intake/Output Summary (Last 24 hours) at 06/13/16 0844 Last data filed at 06/13/16 0400  Gross per 24 hour  Intake           983.91 ml  Output             4050 ml  Net         -3066.09 ml   Filed Weights   06/12/16 0120 06/12/16 0612 06/13/16 0333  Weight: 104.3 kg (230 lb) 101.4 kg (223 lb 8.7 oz) 103.8 kg (228 lb 13.4 oz)    Examination:  General exam: deconditioned, not in pain or dyspnea E ENT: oral mucosa moist, no conjunctival pallor or icterus. Respiratory system: Respiratory effort normal. Vesicular breath sounds bilaterally, no wheezing, rales or rhonchi.  Cardiovascular system: S1 & S2 heard, RRR. No JVD, murmurs, rubs, gallops or clicks. No pedal edema. Gastrointestinal system: Abdomen is nondistended, soft and  nontender. No organomegaly or masses felt. Normal bowel sounds heard. Central nervous system: Alert and oriented. No focal neurological deficits. Extremities: Symmetric 5 x 5 power. Skin: No rashes, lesions or ulcers Psychiatry: Judgement and insight appear normal. Mood & affect appropriate.     Data Reviewed: I have personally reviewed following labs and imaging studies  CBC:  Recent Labs Lab 06/12/16 0100 06/12/16 0110 06/12/16 0711 06/13/16 0324  WBC 22.5*  --  15.6* 11.2*  NEUTROABS 9.9*  --  13.1*  --   HGB 17.2* 19.7* 15.1* 14.1  HCT 52.7* 58.0* 45.3 43.4  MCV  92.9  --  91.0 91.4  PLT 405*  --  296 292   Basic Metabolic Panel:  Recent Labs Lab 06/12/16 0100 06/12/16 0110 06/12/16 0711 06/12/16 1355 06/13/16 0324  NA 137 143 138 138 136  K 3.5 3.6 3.6 3.1* 3.0*  CL 102 100* 104 101 101  CO2 24  --  25 27 27   GLUCOSE 215* 219* 123* 118* 116*  BUN 18 20 19 19  21*  CREATININE 1.40* 1.30* 1.31* 1.19* 1.16*  CALCIUM 8.8*  --  8.5* 8.3* 8.3*  MG  --   --  1.5*  --  1.6*  PHOS  --   --  3.1  --  3.7   GFR: Estimated Creatinine Clearance: 78.3 mL/min (by C-G formula based on SCr of 1.16 mg/dL). Liver Function Tests:  Recent Labs Lab 06/12/16 0100 06/12/16 0711  AST 25 20  ALT 18 15  ALKPHOS 89 68  BILITOT 0.6 0.6  PROT 9.1* 7.2  ALBUMIN 4.3 3.5   No results for input(s): LIPASE, AMYLASE in the last 168 hours. No results for input(s): AMMONIA in the last 168 hours. Coagulation Profile: No results for input(s): INR, PROTIME in the last 168 hours. Cardiac Enzymes:  Recent Labs Lab 06/12/16 0711 06/12/16 1123 06/12/16 1738  TROPONINI 0.37* 0.33* 0.28*   BNP (last 3 results) No results for input(s): PROBNP in the last 8760 hours. HbA1C: No results for input(s): HGBA1C in the last 72 hours. CBG: No results for input(s): GLUCAP in the last 168 hours. Lipid Profile: No results for input(s): CHOL, HDL, LDLCALC, TRIG, CHOLHDL, LDLDIRECT in the last 72 hours. Thyroid Function Tests: No results for input(s): TSH, T4TOTAL, FREET4, T3FREE, THYROIDAB in the last 72 hours. Anemia Panel: No results for input(s): VITAMINB12, FOLATE, FERRITIN, TIBC, IRON, RETICCTPCT in the last 72 hours. Sepsis Labs:  Recent Labs Lab 06/12/16 0711 06/12/16 0720  PROCALCITON 0.52  --   LATICACIDVEN  --  1.9    Recent Results (from the past 240 hour(s))  MRSA PCR Screening     Status: None   Collection Time: 06/12/16  7:43 AM  Result Value Ref Range Status   MRSA by PCR NEGATIVE NEGATIVE Final    Comment:        The GeneXpert MRSA Assay  (FDA approved for NASAL specimens only), is one component of a comprehensive MRSA colonization surveillance program. It is not intended to diagnose MRSA infection nor to guide or monitor treatment for MRSA infections.          Radiology Studies: Dg Chest Port 1 View  Result Date: 06/13/2016 CLINICAL DATA:  Shortness of breath . EXAM: PORTABLE CHEST 1 VIEW COMPARISON:  06/12/2016. FINDINGS: Mediastinum hilar structures normal. Cardiomegaly with mild pulmonary vascular prominence and bilateral interstitial prominence. Findings have improved prior exam. Small left pleural effusion cannot be excluded. IMPRESSION: Cardiomegaly with bilateral from interstitial prominence and  small left pleural effusion. Findings have improved from prior exam. Findings are consistent with clearing congestive heart failure. Electronically Signed   By: Maisie Fushomas  Register   On: 06/13/2016 07:14   Dg Chest Portable 1 View  Result Date: 06/12/2016 CLINICAL DATA:  Acute shortness of breath.  Hypoxia and tachycardia. EXAM: PORTABLE CHEST 1 VIEW COMPARISON:  None. FINDINGS: Mild enlargement of cardiac silhouette. Mediastinal contours are normal. There diffuse bilateral pulmonary opacities in the a slightly perihilar predominant distribution. No definite pleural effusion. Probable left basilar atelectasis. No pneumothorax. No acute osseous abnormality is seen. IMPRESSION: Diffuse bilateral pulmonary opacities, concerning for pulmonary edema versus multifocal pneumonia. Given associated enlargement of cardiac silhouette, pulmonary edema is favored. Electronically Signed   By: Rubye OaksMelanie  Ehinger M.D.   On: 06/12/2016 01:28        Scheduled Meds: . amLODipine  10 mg Oral Daily  . enoxaparin (LOVENOX) injection  40 mg Subcutaneous Q24H  . furosemide  80 mg Intravenous Q12H  . hydrALAZINE  25 mg Oral Q6H   Continuous Infusions: . niCARDipine Stopped (06/12/16 2233)     LOS: 1 day      Mandy Serrano Mandy Gulaaniel Agapito Hanway,  MD [facility] Pager [phone]  If 7PM-7AM, please contact night-coverage [email or web address] Password [email or web address] 06/13/2016, 8:44 AM

## 2016-06-13 NOTE — Progress Notes (Signed)
Report called and given to [place], rn at [facility] and pt transferred to [facility]. Left unit on stretcher pushed by [facility] personnel. Left in stable condition. Patient's niece, Genella Rife, made aware of patient's transfer prior to pt's leaving unit. Room number at [facility] given to niece. VWilliams,rn.

## 2016-06-13 NOTE — Consult Note (Addendum)
CARDIOLOGY CONSULT NOTE   Patient ID: Mandy Serrano MRN: [id number], DOB/AGE: 1976-08-19   Admit date: 06/12/2016 Date of Consult: 06/13/2016  Primary Physician: No PCP Per Patient Primary Cardiologist: New patient  Reason for consult:  Acute respiratory failure with hypoxia, acute systolic CHF  Problem List  History reviewed. No pertinent past medical history.  History reviewed. No pertinent surgical history.   Allergies  No Known Allergies  HPI   This is a 40 year old female with past medical history of hypertension and no prior medical history of heart disease who presented to the long TR on 06/12/2016 with acute onset of dyspnea that started a day prior. He was found to have significantly elevated blood pressure up to 188/101, his chest x-ray revealed significant pulmonary edema and he was placed on BiPAP, admitted to ICU, started on nitroglycerin in nicardipine drip and diuresis with IV Lasix was initiated. He was also found to be in acute renal failure with creatinine of 1.3, his baseline is not known. The patient states that she has had hypertension since age of 23, she was told by her mother that it is sec to kidney problem, but she doesn't know any details. At some point in the past he was prescribed amlodipine, captopril, Lasix, hydralazine at home in [place] but he hasn't been able to take it in the last couple of months as she ran out, the last seen the doctor visit in her [place] in [place]. She states that she developed progressively worsening DOE for the last month, and orthopnea, PND in the last week. Occassional palpitations not associated with dizziness, chest pain only in the last 2 days. She states that she had a similar episode of acute CHF and respiratory failure requiring hospitalization back in [place] approximately 5 years ago, but she doesn't recall being told about low LVEF at that time.   She has 1 healthy daughter that is 76 years old.  Her parents are alive,  father has hypertension. No h/o SCD or CAD.    Inpatient Medications  . amLODipine  10 mg Oral Daily  . carvedilol  25 mg Oral BID WC  . heparin  2,400 Units Intravenous Once  . hydrochlorothiazide  25 mg Oral Daily  . sacubitril-valsartan  1 tablet Oral BID   . heparin    . niCARDipine Stopped (06/12/16 2233)   Family History No family history on file.   Social History Social History   Social History  . Marital status: Married    Spouse name: N/A  . Number of children: N/A  . Years of education: N/A   Occupational History  . Not on file.   Social History Main Topics  . Smoking status: Never Smoker  . Smokeless tobacco: Never Used  . Alcohol use Not on file  . Drug use: Unknown  . Sexual activity: Not on file   Other Topics Concern  . Not on file   Social History Narrative  . No narrative on file    Review of Systems  General:  No chills, fever, night sweats or weight changes.  Cardiovascular:  No chest pain, dyspnea on exertion, edema, orthopnea, palpitations, paroxysmal nocturnal dyspnea. Dermatological: No rash, lesions/masses Respiratory: No cough, dyspnea Urologic: No hematuria, dysuria Abdominal:   No nausea, vomiting, diarrhea, bright red blood per rectum, melena, or hematemesis Neurologic:  No visual changes, wkns, changes in mental status. All other systems reviewed and are otherwise negative except as noted above.  Physical Exam  Blood pressure (!) 154/126,  pulse 94, temperature 97.9 F (36.6 C), temperature source Oral, resp. rate 20, height 5\' 7"  (1.702 m), weight 228 lb 13.4 oz (103.8 kg), SpO2 98 %.  General: Pleasant, NAD Psych: Normal affect. Neuro: Alert and oriented X 3. Moves all extremities spontaneously. HEENT: Normal  Neck: Supple without bruits or JVD. Lungs:  Resp regular and unlabored, mild crackles at bases Heart: RRR + s3, s4, or 2/6 systolic murmur. Abdomen: Soft, non-tender, non-distended, BS + x 4.  Extremities: No  clubbing, cyanosis or edema. DP/PT/Radials 2+ and equal bilaterally.  Labs  Recent Labs  06/12/16 0711 06/12/16 1123 06/12/16 1738  TROPONINI 0.37* 0.33* 0.28*   Lab Results  Component Value Date   WBC 11.2 (H) 06/13/2016   HGB 14.1 06/13/2016   HCT 43.4 06/13/2016   MCV 91.4 06/13/2016   PLT 292 06/13/2016    Recent Labs Lab 06/12/16 0711  06/13/16 0324  NA 138  < > 136  K 3.6  < > 3.0*  CL 104  < > 101  CO2 25  < > 27  BUN 19  < > 21*  CREATININE 1.31*  < > 1.16*  CALCIUM 8.5*  < > 8.3*  PROT 7.2  --   --   BILITOT 0.6  --   --   ALKPHOS 68  --   --   ALT 15  --   --   AST 20  --   --   GLUCOSE 123*  < > 116*  < > = values in this interval not displayed. No results found for: CHOL, HDL, LDLCALC, TRIG No results found for: DDIMER Invalid input(s): POCBNP  Radiology/Studies  Dg Chest Port 1 View  Result Date: 06/13/2016 CLINICAL DATA:  Shortness of breath . IMPRESSION: Cardiomegaly with bilateral from interstitial prominence and small left pleural effusion. Findings have improved from prior exam. Findings are consistent with clearing congestive heart failure. Electronically Signed   By: Maisie Fushomas  Register   On: 06/13/2016 07:14   Dg Chest Portable 1 View  Result Date: 06/12/2016 CLINICAL DATA:  Acute shortness of breath.  Hypoxia and tachycardia. Marland Kitchen. IMPRESSION: Diffuse bilateral pulmonary opacities, concerning for pulmonary edema versus multifocal pneumonia. Given associated enlargement of cardiac silhouette, pulmonary edema is favored. Electronically Signed   By: Rubye OaksMelanie  Ehinger M.D.   On: 06/12/2016 01:28   Echocardiogram 06/12/2016 - Left ventricle: The cavity size was moderately dilated. Wall   thickness was increased in a pattern of moderate LVH. Calcified   apical false tendon - there is attached filamentous mobile   material which may represent thrombus. Systolic function was   severely reduced. The estimated ejection fraction was in the   range of 25% to  30%. Global hypokinesis with regional variation.   The study is not technically sufficient to allow evaluation of LV   diastolic function. - Aortic valve: Sclerosis without stenosis. There was moderate   regurgitation. - Mitral valve: Mildly thickened leaflets . There was mild   regurgitation. - Left atrium: The atrium was mildly dilated. - Right ventricle: The cavity size was normal. Systolic function is   low normal. - Inferior vena cava: The vessel was normal in size. The   respirophasic diameter changes were in the normal range (= 50%),   consistent with normal central venous pressure.  Impressions:  LVEF 25-30%, moderately dilated LV with severe global hypokinesis   and regional variation, there is a calcified apical false tendon   with mobile, filamentous material attached suggestive of  thrombus, there is moderate AI, mild MR and mild LAE.   Renal arterial US: 06/13/2016  - No evidence of renal artery stenosis noted bilaterally. - Bilateral normal intrarenal resistive indices.  TELE: SR, PVCs, couplets   ASSESSMENT AND PLAN  40 year old female   1. New diagnosis of cardiomyopathy - most probably hypertensive in etiology, there is moderate concentric LVH with diffuse hypokinesis and elevated filling pressures. She also has moderate AI. Elevated troponin is most probably sec to hypertensive emergency and acute on chronic kidney failure, troponin 0.37-->0.33-->0.27.  - I will d/c NTG drip, d/c hydralazine - start Entresto 49/51 mg po BID staring tonight, uptitrate per CHF team, continue carvedilol, potentially adding spironolactone  - [facility] to [facility], to be seen by CHF service in the am for further management and social work help as she doesn't have insurance and will require close follow up and HF medications - I will order a cardiac MRI to evaluate for etiology and to evaluate for possible LV apical thrombus seen on echocardiogram, for now started on Heparin  drip  2. Acute systolic CHF - improved, diuresed 1.9 L since yesterday, I would still give lasix 20 mg iv Q12H and reevaluate in the am.   3. Hypertensive emergency - as above  4. Acute on chronic kidney failure - CKD stage III with GFR 53, h/o unknown kidney disease since the childhood, ? Nephrotic syndrome?, no renal artery stenosis on [place], I will order a kidney US to evaluate for size, Crea has improved 1.3 --> 1.1.  5. Social issues - no insurance, we are starting Entresto, also will require close outpatient follow up, CHF social worker will see her, I really appreciate CHF team help!!!  Signed, Tobias Alexander, MD, [facility] 06/13/2016, 7:16 PM

## 2016-06-14 ENCOUNTER — Inpatient Hospital Stay (HOSPITAL_COMMUNITY): Payer: Self-pay

## 2016-06-14 ENCOUNTER — Encounter (HOSPITAL_COMMUNITY): Payer: Self-pay | Admitting: Radiology

## 2016-06-14 DIAGNOSIS — J810 Acute pulmonary edema: Secondary | ICD-10-CM | POA: Diagnosis present

## 2016-06-14 DIAGNOSIS — I509 Heart failure, unspecified: Secondary | ICD-10-CM

## 2016-06-14 DIAGNOSIS — I5021 Acute systolic (congestive) heart failure: Secondary | ICD-10-CM | POA: Diagnosis present

## 2016-06-14 LAB — URINE MICROSCOPIC-ADD ON

## 2016-06-14 LAB — URINALYSIS, ROUTINE W REFLEX MICROSCOPIC
Bilirubin Urine: NEGATIVE
GLUCOSE, UA: NEGATIVE mg/dL
HGB URINE DIPSTICK: NEGATIVE
Ketones, ur: NEGATIVE mg/dL
Leukocytes, UA: NEGATIVE
Nitrite: NEGATIVE
PH: 6.5 (ref 5.0–8.0)
Protein, ur: 100 mg/dL — AB
SPECIFIC GRAVITY, URINE: 1.017 (ref 1.005–1.030)

## 2016-06-14 LAB — CBC WITH DIFFERENTIAL/PLATELET
Basophils Absolute: 0 10*3/uL (ref 0.0–0.1)
Basophils Relative: 0 %
EOS PCT: 2 %
Eosinophils Absolute: 0.2 10*3/uL (ref 0.0–0.7)
HCT: 46.7 % — ABNORMAL HIGH (ref 36.0–46.0)
HEMOGLOBIN: 14.9 g/dL (ref 12.0–15.0)
LYMPHS ABS: 3.5 10*3/uL (ref 0.7–4.0)
LYMPHS PCT: 38 %
MCH: 29.9 pg (ref 26.0–34.0)
MCHC: 31.9 g/dL (ref 30.0–36.0)
MCV: 93.6 fL (ref 78.0–100.0)
Monocytes Absolute: 0.7 10*3/uL (ref 0.1–1.0)
Monocytes Relative: 7 %
Neutro Abs: 5 10*3/uL (ref 1.7–7.7)
Neutrophils Relative %: 53 %
PLATELETS: 308 10*3/uL (ref 150–400)
RBC: 4.99 MIL/uL (ref 3.87–5.11)
RDW: 14.4 % (ref 11.5–15.5)
WBC: 9.3 10*3/uL (ref 4.0–10.5)

## 2016-06-14 LAB — HEPARIN LEVEL (UNFRACTIONATED)
HEPARIN UNFRACTIONATED: 0.38 [IU]/mL (ref 0.30–0.70)
Heparin Unfractionated: 0.25 IU/mL — ABNORMAL LOW (ref 0.30–0.70)
Heparin Unfractionated: 0.4 IU/mL (ref 0.30–0.70)

## 2016-06-14 LAB — BASIC METABOLIC PANEL
ANION GAP: 8 (ref 5–15)
Anion gap: 10 (ref 5–15)
BUN: 21 mg/dL — ABNORMAL HIGH (ref 6–20)
BUN: 20 mg/dL (ref 6–20)
CALCIUM: 8.7 mg/dL — AB (ref 8.9–10.3)
CO2: 27 mmol/L (ref 22–32)
CO2: 26 mmol/L (ref 22–32)
Calcium: 8.8 mg/dL — ABNORMAL LOW (ref 8.9–10.3)
Chloride: 100 mmol/L — ABNORMAL LOW (ref 101–111)
Chloride: 99 mmol/L — ABNORMAL LOW (ref 101–111)
Creatinine, Ser: 1.24 mg/dL — ABNORMAL HIGH (ref 0.44–1.00)
Creatinine, Ser: 1.18 mg/dL — ABNORMAL HIGH (ref 0.44–1.00)
GFR calc Af Amer: >60 mL/min (ref >60)
GFR calc non Af Amer: 56 mL/min — ABNORMAL LOW (ref >60)
GFR, EST NON AFRICAN AMERICAN: 53 mL/min — AB (ref >60)
Glucose, Bld: 126 mg/dL — ABNORMAL HIGH (ref 65–99)
Glucose, Bld: 115 mg/dL — ABNORMAL HIGH (ref 65–99)
Potassium: 3.4 mmol/L — ABNORMAL LOW (ref 3.5–5.1)
Potassium: 3.4 mmol/L — ABNORMAL LOW (ref 3.5–5.1)
SODIUM: 135 mmol/L (ref 135–145)
Sodium: 135 mmol/L (ref 135–145)

## 2016-06-14 LAB — RAPID URINE DRUG SCREEN, HOSP PERFORMED
Amphetamines: NOT DETECTED
BENZODIAZEPINES: NOT DETECTED
Barbiturates: NOT DETECTED
COCAINE: NOT DETECTED
OPIATES: NOT DETECTED
Tetrahydrocannabinol: NOT DETECTED

## 2016-06-14 MED ORDER — GADOBENATE DIMEGLUMINE 529 MG/ML IV SOLN
40.0000 mL | Freq: Once | INTRAVENOUS | Status: AC
  Administered 2016-06-14: 40 mL via INTRAVENOUS

## 2016-06-14 MED ORDER — POTASSIUM CHLORIDE CRYS ER 20 MEQ PO TBCR
40.0000 meq | EXTENDED_RELEASE_TABLET | Freq: Every day | ORAL | Status: DC
  Administered 2016-06-14 – 2016-06-15 (×2): 40 meq via ORAL
  Filled 2016-06-14 (×2): qty 2

## 2016-06-14 MED ORDER — SODIUM CHLORIDE 0.9 % IV BOLUS (SEPSIS)
500.0000 mL | Freq: Once | INTRAVENOUS | Status: AC
  Administered 2016-06-14: 500 mL via INTRAVENOUS

## 2016-06-14 NOTE — Progress Notes (Signed)
ANTICOAGULATION CONSULT NOTE Pharmacy Consult for heparin Indication: left ventricular thrombus  No Known Allergies  Patient Measurements: Height: 5\' 2"  (157.5 cm) Weight: 223 lb 5.2 oz (101.3 kg) IBW/kg (Calculated) : 50.1 Heparin Dosing Weight: 85kg  Vital Signs: Temp: 98.4 F (36.9 C) (08/22 2325) Temp Source: Oral (08/22 2325) BP: 144/91 (08/23 0200) Pulse Rate: 74 (08/23 0219)  Labs:  Recent Labs  06/12/16 0100 06/12/16 0110 06/12/16 0711 06/12/16 1123 06/12/16 1355 06/12/16 1738 06/13/16 0324 06/13/16 1954 06/14/16 0124  HGB 17.2* 19.7* 15.1*  --   --   --  14.1  --   --   HCT 52.7* 58.0* 45.3  --   --   --  43.4  --   --   PLT 405*  --  296  --   --   --  292  --   --   LABPROT  --   --   --   --   --   --   --  14.3  --   INR  --   --   --   --   --   --   --  1.10  --   HEPARINUNFRC  --   --   --   --   --   --   --   --  0.38  CREATININE 1.40* 1.30* 1.31*  --  1.19*  --  1.16*  --  1.24*  TROPONINI  --   --  0.37* 0.33*  --  0.28*  --   --   --     Estimated Creatinine Clearance: 65.9 mL/min (by C-G formula based on SCr of 1.24 mg/dL).  Assessment: 40 y.o. female with new CHF found to have LV thrombus for heparin  Goal of Therapy:  Heparin level 0.3-0.7 units/ml Monitor platelets by anticoagulation protocol  Plan:  Continue Heparin at current rate  Recheck level later this morning to verify  Geannie Risen, PharmD, BCPS

## 2016-06-14 NOTE — Progress Notes (Signed)
PROGRESS NOTE    Mandy Serrano  ZOX:[id number] DOB: 1976/03/08 DOA: 06/12/2016 PCP: No PCP Per Patient   Brief Narrative: Hypertensive emergency complicated by cardiogenic pulmonary edema and hypoxic respiratory failure. 40 yo female with long standing hypertension, has been off blood pressure medications for 1 month. Admitted to step down unit, started on nitroglycerin and nicardipine infusion. Aggressive diuresis.    Assessment & Plan:   1. Hypertensive emergency. - Patient off nitroglycerin and nicardipine drip.  -Blood pressure improved -continue amlodipine 10 mg, hydralazine to 50 mg tid, started on entresto per Cards  2. Acute Pulm edema.  -improved with diuresis -Echocardiogram with significant reduction on LV systolic function down to 25 to 30%. With global hypokinesis, with possible left ventricle thrombus.  -Cards following, started on Entresto, off diuretics -continue coreg -has no PCP/insurance, CM consult  3. Possible LV thrombus -now on Heparin, will likely need TEE -cards following  4. AKI.  -improved, off lasix, now on entresto -Follow renal panel in am  5. Reactive leukytosis.  -resolved  DVT prophylaxis:  Lovenox Code Status: full Family Communication: no family at the bedside Disposition Plan: home in 1-2days  Consultants:     Procedures:   Antimicrobials:   Subjective: Patient feeling better, dyspnea has improved, wondering abt going home Objective: Vitals:   06/14/16 0200 06/14/16 0219 06/14/16 0300 06/14/16 0350  BP: (!) 144/91  136/81 138/88  Pulse: 78 74 64 73  Resp: (!) 22 (!) 29 (!) 21 (!) 21  Temp:    98.2 F (36.8 C)  TempSrc:    Oral  SpO2: 96% 98% 96% 99%  Weight:      Height:        Intake/Output Summary (Last 24 hours) at 06/14/16 0728 Last data filed at 06/14/16 0550  Gross per 24 hour  Intake              270 ml  Output             1000 ml  Net             -730 ml   Filed Weights   06/13/16 0333 06/13/16 2325  06/14/16 0127  Weight: 103.8 kg (228 lb 13.4 oz) 101.3 kg (223 lb 5.2 oz) 101.3 kg (223 lb 5.2 oz)    Examination:  General exam: AAOx3 E ENT: oral mucosa moist, no conjunctival pallor or icterus. Respiratory system: Respiratory effort normal. Vesicular breath sounds bilaterally, no wheezing, rales or rhonchi.  Cardiovascular system: S1 & S2 heard, RRR. No JVD, murmurs, rubs, gallops or clicks. No pedal edema. Gastrointestinal system: Abdomen is nondistended, soft and nontender. No organomegaly or masses felt. Normal bowel sounds heard. Central nervous system: Alert and oriented. No focal neurological deficits. Extremities: Symmetric 5 x 5 power. Skin: No rashes, lesions or ulcers Psychiatry: Judgement and insight appear normal. Mood & affect appropriate.     Data Reviewed: I have personally reviewed following labs and imaging studies  CBC:  Recent Labs Lab 06/12/16 0100 06/12/16 0110 06/12/16 0711 06/13/16 0324 06/14/16 0512  WBC 22.5*  --  15.6* 11.2* 9.3  NEUTROABS 9.9*  --  13.1*  --  5.0  HGB 17.2* 19.7* 15.1* 14.1 14.9  HCT 52.7* 58.0* 45.3 43.4 46.7*  MCV 92.9  --  91.0 91.4 93.6  PLT 405*  --  296 292 308   Basic Metabolic Panel:  Recent Labs Lab 06/12/16 0711 06/12/16 1355 06/13/16 0324 06/14/16 0124 06/14/16 0512  NA 138 138 136 135  135  K 3.6 3.1* 3.0* 3.4* 3.4*  CL 104 101 101 100* 99*  CO2 25 27 27 27 26   GLUCOSE 123* 118* 116* 126* 115*  BUN 19 19 21* 21* 20  CREATININE 1.31* 1.19* 1.16* 1.24* 1.18*  CALCIUM 8.5* 8.3* 8.3* 8.7* 8.8*  MG 1.5*  --  1.6*  --   --   PHOS 3.1  --  3.7  --   --    GFR: Estimated Creatinine Clearance: 69.2 mL/min (by C-G formula based on SCr of 1.18 mg/dL). Liver Function Tests:  Recent Labs Lab 06/12/16 0100 06/12/16 0711  AST 25 20  ALT 18 15  ALKPHOS 89 68  BILITOT 0.6 0.6  PROT 9.1* 7.2  ALBUMIN 4.3 3.5   No results for input(s): LIPASE, AMYLASE in the last 168 hours. No results for input(s): AMMONIA  in the last 168 hours. Coagulation Profile:  Recent Labs Lab 06/13/16 1954  INR 1.10   Cardiac Enzymes:  Recent Labs Lab 06/12/16 0711 06/12/16 1123 06/12/16 1738  TROPONINI 0.37* 0.33* 0.28*   BNP (last 3 results) No results for input(s): PROBNP in the last 8760 hours. HbA1C: No results for input(s): HGBA1C in the last 72 hours. CBG: No results for input(s): GLUCAP in the last 168 hours. Lipid Profile: No results for input(s): CHOL, HDL, LDLCALC, TRIG, CHOLHDL, LDLDIRECT in the last 72 hours. Thyroid Function Tests: No results for input(s): TSH, T4TOTAL, FREET4, T3FREE, THYROIDAB in the last 72 hours. Anemia Panel: No results for input(s): VITAMINB12, FOLATE, FERRITIN, TIBC, IRON, RETICCTPCT in the last 72 hours. Sepsis Labs:  Recent Labs Lab 06/12/16 0711 06/12/16 0720  PROCALCITON 0.52  --   LATICACIDVEN  --  1.9    Recent Results (from the past 240 hour(s))  Culture, blood (routine x 2)     Status: None (Preliminary result)   Collection Time: 06/12/16  7:11 AM  Result Value Ref Range Status   Specimen Description BLOOD RIGHT ARM  Final   Special Requests IN PEDIATRIC BOTTLE 3CC  Final   Culture   Final    NO GROWTH 1 DAY Performed at [facility]    Report Status PENDING  Incomplete  Culture, blood (routine x 2)     Status: None (Preliminary result)   Collection Time: 06/12/16  7:11 AM  Result Value Ref Range Status   Specimen Description BLOOD RIGHT HAND  Final   Special Requests IN PEDIATRIC BOTTLE 3CC  Final   Culture   Final    NO GROWTH 1 DAY Performed at [facility]    Report Status PENDING  Incomplete  MRSA PCR Screening     Status: None   Collection Time: 06/12/16  7:43 AM  Result Value Ref Range Status   MRSA by PCR NEGATIVE NEGATIVE Final    Comment:        The GeneXpert MRSA Assay (FDA approved for NASAL specimens only), is one component of a comprehensive MRSA colonization surveillance program. It is not intended to  diagnose MRSA infection nor to guide or monitor treatment for MRSA infections.          Radiology Studies: Dg Chest Port 1 View  Result Date: 06/13/2016 CLINICAL DATA:  Shortness of breath . EXAM: PORTABLE CHEST 1 VIEW COMPARISON:  06/12/2016. FINDINGS: Mediastinum hilar structures normal. Cardiomegaly with mild pulmonary vascular prominence and bilateral interstitial prominence. Findings have improved prior exam. Small left pleural effusion cannot be excluded. IMPRESSION: Cardiomegaly with bilateral from interstitial prominence and  small left pleural effusion. Findings have improved from prior exam. Findings are consistent with clearing congestive heart failure. Electronically Signed   By: Maisie Fushomas  Register   On: 06/13/2016 07:14        Scheduled Meds: . amLODipine  10 mg Oral Daily  . aspirin EC  81 mg Oral Daily  . carvedilol  25 mg Oral BID WC  . potassium chloride SA  40 mEq Oral Daily  . sacubitril-valsartan  1 tablet Oral BID  . sodium chloride flush  3 mL Intravenous Q12H   Continuous Infusions: . heparin 1,500 Units/hr (06/13/16 2300)  . niCARDipine Stopped (06/12/16 2233)     LOS: 2 days    Time spent: 35min  Cele Mote, MD [facility] Pager [phone]  If 7PM-7AM, please contact night-coverage [email or web address] Password [email or web address] 06/14/2016, 7:28 AM

## 2016-06-14 NOTE — Progress Notes (Signed)
Patient resting comfortably on room air with no respiratory distress noted. BIPAP is not needed at this time. RT will continue to monitor as needed.

## 2016-06-14 NOTE — Progress Notes (Signed)
ANTICOAGULATION CONSULT NOTE  Pharmacy Consult for heparin Indication: left ventricular thrombus  No Known Allergies  Patient Measurements: Height: 5\' 2"  (157.5 cm) Weight: 223 lb 5.2 oz (101.3 kg) IBW/kg (Calculated) : 50.1 Heparin Dosing Weight: 85kg  Vital Signs: Temp: 98.2 F (36.8 C) (08/23 0827) Temp Source: Oral (08/23 0827) BP: 144/95 (08/23 0827) Pulse Rate: 77 (08/23 0827)  Labs:  Recent Labs  06/12/16 0711 06/12/16 1123  06/12/16 1738 06/13/16 0324 06/13/16 1954 06/14/16 0124 06/14/16 0512 06/14/16 0944  HGB 15.1*  --   --   --  14.1  --   --  14.9  --   HCT 45.3  --   --   --  43.4  --   --  46.7*  --   PLT 296  --   --   --  292  --   --  308  --   LABPROT  --   --   --   --   --  14.3  --   --   --   INR  --   --   --   --   --  1.10  --   --   --   HEPARINUNFRC  --   --   --   --   --   --  0.38  --  0.25*  CREATININE 1.31*  --   < >  --  1.16*  --  1.24* 1.18*  --   TROPONINI 0.37* 0.33*  --  0.28*  --   --   --   --   --   < > = values in this interval not displayed.  Estimated Creatinine Clearance: 69.2 mL/min (by C-G formula based on SCr of 1.18 mg/dL).     Assessment: 40 year old female with past medical history of hypertension and no prior medical history of heart disease who presented to the long TR on 06/12/2016 with acute onset of dyspnea that started a day prior. She was found to have significantly elevated blood pressure, acute renal failure and a LV apical thrombus. Heparin level was low this morning, CBC stable.    Goal of Therapy:  Heparin level 0.3-0.7 units/ml Monitor platelets by anticoagulation protocol   Plan:  -Increase heparin to 1700 units/hr -Daily HL, CBC -Check level this evening -F/u MRI to evaluate thrombus    Agapito Games, PharmD, BCPS Clinical Pharmacist 06/14/2016 10:45 AM

## 2016-06-14 NOTE — Care Management Note (Signed)
Case Management Note ill Patient Details  Name: Mandy Serrano MRN: [id number] Date of Birth: 1975/12/13  Subjective/Objective:   Pt lives with spouse, niece is supportive and lives near pt - works @ [employer].  Pt has no PCP, no insurance.  She has been started on Entresto.  Provided card for free 30-day supply and discussed with pt. Pt will not qualify for pharmaceutical patient assistance program because citizenship is required, but Sherryll Burger rep will work with cardiologist's office.  [facility] is not admitting new pts but will schedule a hospital f/u visit on Thursday or Friday if pt calls to make an appointment on Monday. CM provided pt with list of free and low-cost clinics and discussed her options for establishing primary care. Pt will qualify for [facility] Program when discharged.                        Expected Discharge Plan:  Home/Self Care  Discharge planning Services  CM Consult, Medication Assistance, MATCH Program  Status of Service:  In process, will continue to follow  Magdalene River, RN 06/14/2016, 3:57 PM

## 2016-06-14 NOTE — Progress Notes (Signed)
ANTICOAGULATION CONSULT NOTE - Follow Up Consult  Pharmacy Consult for Heparin Indication: possible LV thrombus  No Known Allergies  Patient Measurements: Height: 5\' 2"  (157.5 cm) Weight: 223 lb 5.2 oz (101.3 kg) IBW/kg (Calculated) : 50.1 Heparin Dosing Weight: 85 kg  Vital Signs: Temp: 99 F (37.2 C) (08/23 1611) Temp Source: Oral (08/23 1611) BP: 101/63 (08/23 1818) Pulse Rate: 75 (08/23 1611)  Labs:  Recent Labs  06/12/16 0711 06/12/16 1123  06/12/16 1738 06/13/16 0324 06/13/16 1954 06/14/16 0124 06/14/16 0512 06/14/16 0944 06/14/16 1732  HGB 15.1*  --   --   --  14.1  --   --  14.9  --   --   HCT 45.3  --   --   --  43.4  --   --  46.7*  --   --   PLT 296  --   --   --  292  --   --  308  --   --   LABPROT  --   --   --   --   --  14.3  --   --   --   --   INR  --   --   --   --   --  1.10  --   --   --   --   HEPARINUNFRC  --   --   --   --   --   --  0.38  --  0.25* 0.40  CREATININE 1.31*  --   < >  --  1.16*  --  1.24* 1.18*  --   --   TROPONINI 0.37* 0.33*  --  0.28*  --   --   --   --   --   --   < > = values in this interval not displayed.  Estimated Creatinine Clearance: 69.2 mL/min (by C-G formula based on SCr of 1.18 mg/dL).  Assessment:   Heparin level is therapeutic (0.40) tonight on 1700 units/hr.   Cardiac MRI today.   Goal of Therapy:  Heparin level 0.3-0.7 units/ml Monitor platelets by anticoagulation protocol: Yes   Plan:   Continue heparin drip at 1700 units/hr  Daily heparin level and CBC while on heparin.  Will follow up plans.  Dennie Fetters, [place] Pager: [phone] 06/14/2016,7:17 PM

## 2016-06-15 DIAGNOSIS — I493 Ventricular premature depolarization: Secondary | ICD-10-CM

## 2016-06-15 DIAGNOSIS — I10 Essential (primary) hypertension: Secondary | ICD-10-CM

## 2016-06-15 DIAGNOSIS — J810 Acute pulmonary edema: Secondary | ICD-10-CM

## 2016-06-15 LAB — URINE CULTURE

## 2016-06-15 LAB — BASIC METABOLIC PANEL
Anion gap: 6 (ref 5–15)
BUN: 27 mg/dL — ABNORMAL HIGH (ref 6–20)
CO2: 25 mmol/L (ref 22–32)
Calcium: 8.7 mg/dL — ABNORMAL LOW (ref 8.9–10.3)
Chloride: 103 mmol/L (ref 101–111)
Creatinine, Ser: 1.3 mg/dL — ABNORMAL HIGH (ref 0.44–1.00)
GFR calc Af Amer: 58 mL/min — ABNORMAL LOW (ref >60)
GFR calc non Af Amer: 50 mL/min — ABNORMAL LOW (ref >60)
Glucose, Bld: 96 mg/dL (ref 65–99)
Potassium: 3.7 mmol/L (ref 3.5–5.1)
Sodium: 134 mmol/L — ABNORMAL LOW (ref 135–145)

## 2016-06-15 LAB — CBC
HEMATOCRIT: 44.1 % (ref 36.0–46.0)
Hemoglobin: 14 g/dL (ref 12.0–15.0)
MCH: 29.9 pg (ref 26.0–34.0)
MCHC: 31.7 g/dL (ref 30.0–36.0)
MCV: 94 fL (ref 78.0–100.0)
PLATELETS: 283 10*3/uL (ref 150–400)
RBC: 4.69 MIL/uL (ref 3.87–5.11)
RDW: 14.3 % (ref 11.5–15.5)
WBC: 9.4 10*3/uL (ref 4.0–10.5)

## 2016-06-15 LAB — HEPARIN LEVEL (UNFRACTIONATED): HEPARIN UNFRACTIONATED: 0.5 [IU]/mL (ref 0.30–0.70)

## 2016-06-15 MED ORDER — CARVEDILOL 3.125 MG PO TABS
3.1250 mg | ORAL_TABLET | Freq: Two times a day (BID) | ORAL | Status: DC
  Administered 2016-06-15 – 2016-06-17 (×5): 3.125 mg via ORAL
  Filled 2016-06-15 (×5): qty 1

## 2016-06-15 MED ORDER — ENOXAPARIN SODIUM 40 MG/0.4ML ~~LOC~~ SOLN
40.0000 mg | SUBCUTANEOUS | Status: DC
  Administered 2016-06-15 – 2016-06-16 (×2): 40 mg via SUBCUTANEOUS
  Filled 2016-06-15 (×2): qty 0.4

## 2016-06-15 MED ORDER — CARVEDILOL 6.25 MG PO TABS
6.2500 mg | ORAL_TABLET | Freq: Two times a day (BID) | ORAL | Status: DC
  Filled 2016-06-15: qty 1

## 2016-06-15 MED ORDER — AMIODARONE HCL 200 MG PO TABS
200.0000 mg | ORAL_TABLET | Freq: Two times a day (BID) | ORAL | Status: DC
  Administered 2016-06-15 – 2016-06-17 (×5): 200 mg via ORAL
  Filled 2016-06-15 (×5): qty 1

## 2016-06-15 MED ORDER — MAGNESIUM SULFATE 4 GM/100ML IV SOLN
4.0000 g | Freq: Once | INTRAVENOUS | Status: AC
  Administered 2016-06-15: 4 g via INTRAVENOUS
  Filled 2016-06-15: qty 100

## 2016-06-15 MED ORDER — SPIRONOLACTONE 25 MG PO TABS
12.5000 mg | ORAL_TABLET | Freq: Every day | ORAL | Status: DC
  Administered 2016-06-15: 12.5 mg via ORAL
  Filled 2016-06-15: qty 1

## 2016-06-15 NOTE — Progress Notes (Signed)
CARDIAC REHAB PHASE I   PRE:  Rate/Rhythm: 74 SR PVCs  BP:  Supine:   Sitting: 108/74  Standing:    SaO2: 96%RA  MODE:  Ambulation: 360 ft   POST:  Rate/Rhythm: 87 SR some paired PVCs after walk. PVCs  BP:  Supine:   Sitting: 98/59  Standing:    SaO2: 95%RA 1425-1505 Pt walked 360 ft on RA with steady gait. No SOB noted. Denied dizziness with BP drop. More PVCs after walk. Tolerated well though. To sitting on side of bed after walk. Family member in to help re enforce ed. Gave pt CHF booklet and discussed zones especially when to call MD. Pt does not have scales but stated she could get some. Gave low sodium diets and discussed 2000 mg restriction. Pt very receptive to ed.    Luetta Nutting, RN BSN  06/15/2016 3:00 PM

## 2016-06-15 NOTE — Progress Notes (Signed)
ANTICOAGULATION CONSULT NOTE - Follow Up Consult  Pharmacy Consult for heparin Indication: R/O LV thrombus  No Known Allergies  Patient Measurements: Height: 5\' 2"  (157.5 cm) Weight: 223 lb 5.2 oz (101.3 kg) IBW/kg (Calculated) : 50.1 Heparin Dosing Weight: 85  Vital Signs: Temp: 97.7 F (36.5 C) (08/24 0534) Temp Source: Oral (08/24 0534) BP: 107/85 (08/24 0534)  Labs:  Recent Labs  06/12/16 1123  06/12/16 1738  06/13/16 0324 06/13/16 1954  06/14/16 0124 06/14/16 0512 06/14/16 0944 06/14/16 1732 06/15/16 0201  HGB  --   --   --   < > 14.1  --   --   --  14.9  --   --  14.0  HCT  --   --   --   --  43.4  --   --   --  46.7*  --   --  44.1  PLT  --   --   --   --  292  --   --   --  308  --   --  283  LABPROT  --   --   --   --   --  14.3  --   --   --   --   --   --   INR  --   --   --   --   --  1.10  --   --   --   --   --   --   HEPARINUNFRC  --   --   --   --   --   --   < > 0.38  --  0.25* 0.40 0.50  CREATININE  --   < >  --   --  1.16*  --   --  1.24* 1.18*  --   --  1.30*  TROPONINI 0.33*  --  0.28*  --   --   --   --   --   --   --   --   --   < > = values in this interval not displayed.  Estimated Creatinine Clearance: 62.8 mL/min (by C-G formula based on SCr of 1.3 mg/dL).   Medications:  Scheduled:  . aspirin EC  81 mg Oral Daily  . carvedilol  25 mg Oral BID WC  . potassium chloride SA  40 mEq Oral Daily  . sacubitril-valsartan  1 tablet Oral BID  . sodium chloride flush  3 mL Intravenous Q12H    Assessment: 40yo with new CHF.  MRI (-) thrombus, (+) hypertensive CM.  Heparin level is therapeutic and CBC is wnl.  No bleeding noted.  Goal of Therapy:  Heparin level 0.3-0.7 units/ml Monitor platelets by anticoagulation protocol: Yes   Plan:  Continue heparin at current rate Watch for s/s of bleeding F/U plans to continue heparin, given no thrombus on MRI   Marisue Humble, PharmD Clinical Pharmacist [facility]

## 2016-06-15 NOTE — Progress Notes (Signed)
PROGRESS NOTE    Mandy Serrano  OHY:[id number] DOB: [birth year] DOA: 06/12/2016 PCP: No PCP Per Patient   Brief Narrative: Hypertensive emergency complicated by cardiogenic pulmonary edema and hypoxic respiratory failure. 40 yo female with long standing hypertension, has been off blood pressure medications for 1 month. Admitted to step down unit, started on nitroglycerin and nicardipine infusion. Aggressive diuresis.    Assessment & Plan:   1. Hypertensive emergency. -required nitroglycerin and nicardipine drip, now off -Blood pressure dropped last pm, hence stopped amlodipine 10 mg, hydralazine to 50 mg tid and entresto -cut down coreg dose  2. Acute Pulm edema/Acute systolic CHF.  -improved with diuresis, now euvolemic -Echocardiogram with significant reduction on LV systolic function down to 25 to 30%. With global hypokinesis, with possible left ventricle thrombus. MRI negative for LV thrombus -Cards following, cut down coreg and added low dose aldactone -LV thrombus ruled out with MRI and hence heparin stopped  4. AKI.  -mild due to diuresis, ARB, monitor -renal artery duplex  5. Reactive leukytosis.  -resolved  DVT prophylaxis:  Lovenox Code Status: full Family Communication: no family at the bedside Disposition Plan: Tx to tele, home in 1-2days  Consultants:   CHF team  Procedures:   Antimicrobials:   Subjective: Felt very dizzy last pm, now better  Objective: Vitals:   06/15/16 0600 06/15/16 0700 06/15/16 0800 06/15/16 0807  BP: 115/64 101/77 97/77 98/86   Pulse: 63 72 78 70  Resp: 17 18 (!) 21 20  Temp:      TempSrc:    Oral  SpO2: 91% 93% 97% 95%  Weight:      Height:        Intake/Output Summary (Last 24 hours) at 06/15/16 1113 Last data filed at 06/15/16 0535  Gross per 24 hour  Intake           931.57 ml  Output              701 ml  Net           230.57 ml   Filed Weights   06/13/16 0333 06/13/16 2325 06/14/16 0127  Weight: 103.8 kg (228 lb  13.4 oz) 101.3 kg (223 lb 5.2 oz) 101.3 kg (223 lb 5.2 oz)    Examination:  General exam: AAOx3 E ENT: oral mucosa moist, no conjunctival pallor or icterus. Respiratory system: Respiratory effort normal. Vesicular breath sounds bilaterally, no wheezing, rales or rhonchi.  Cardiovascular system: S1 & S2 heard, RRR. No JVD, murmurs, rubs, gallops or clicks. No pedal edema. Gastrointestinal system: Abdomen is nondistended, soft and nontender. No organomegaly or masses felt. Normal bowel sounds heard. Central nervous system: Alert and oriented. No focal neurological deficits. Extremities: Symmetric 5 x 5 power. Skin: No rashes, lesions or ulcers Psychiatry: Judgement and insight appear normal. Mood & affect appropriate.     Data Reviewed: I have personally reviewed following labs and imaging studies  CBC:  Recent Labs Lab 06/12/16 0100 06/12/16 0110 06/12/16 0711 06/13/16 0324 06/14/16 0512 06/15/16 0201  WBC 22.5*  --  15.6* 11.2* 9.3 9.4  NEUTROABS 9.9*  --  13.1*  --  5.0  --   HGB 17.2* 19.7* 15.1* 14.1 14.9 14.0  HCT 52.7* 58.0* 45.3 43.4 46.7* 44.1  MCV 92.9  --  91.0 91.4 93.6 94.0  PLT 405*  --  296 292 308 283   Basic Metabolic Panel:  Recent Labs Lab 06/12/16 0711 06/12/16 1355 06/13/16 0324 06/14/16 0124 06/14/16 0512 06/15/16 0201  NA  138 138 136 135 135 134*  K 3.6 3.1* 3.0* 3.4* 3.4* 3.7  CL 104 101 101 100* 99* 103  CO2 25 27 27 27 26 25   GLUCOSE 123* 118* 116* 126* 115* 96  BUN 19 19 21* 21* 20 27*  CREATININE 1.31* 1.19* 1.16* 1.24* 1.18* 1.30*  CALCIUM 8.5* 8.3* 8.3* 8.7* 8.8* 8.7*  MG 1.5*  --  1.6*  --   --   --   PHOS 3.1  --  3.7  --   --   --    GFR: Estimated Creatinine Clearance: 62.8 mL/min (by C-G formula based on SCr of 1.3 mg/dL). Liver Function Tests:  Recent Labs Lab 06/12/16 0100 06/12/16 0711  AST 25 20  ALT 18 15  ALKPHOS 89 68  BILITOT 0.6 0.6  PROT 9.1* 7.2  ALBUMIN 4.3 3.5   No results for input(s): LIPASE,  AMYLASE in the last 168 hours. No results for input(s): AMMONIA in the last 168 hours. Coagulation Profile:  Recent Labs Lab 06/13/16 1954  INR 1.10   Cardiac Enzymes:  Recent Labs Lab 06/12/16 0711 06/12/16 1123 06/12/16 1738  TROPONINI 0.37* 0.33* 0.28*   BNP (last 3 results) No results for input(s): PROBNP in the last 8760 hours. HbA1C: No results for input(s): HGBA1C in the last 72 hours. CBG: No results for input(s): GLUCAP in the last 168 hours. Lipid Profile: No results for input(s): CHOL, HDL, LDLCALC, TRIG, CHOLHDL, LDLDIRECT in the last 72 hours. Thyroid Function Tests: No results for input(s): TSH, T4TOTAL, FREET4, T3FREE, THYROIDAB in the last 72 hours. Anemia Panel: No results for input(s): VITAMINB12, FOLATE, FERRITIN, TIBC, IRON, RETICCTPCT in the last 72 hours. Sepsis Labs:  Recent Labs Lab 06/12/16 0711 06/12/16 0720  PROCALCITON 0.52  --   LATICACIDVEN  --  1.9    Recent Results (from the past 240 hour(s))  Culture, blood (routine x 2)     Status: None (Preliminary result)   Collection Time: 06/12/16  7:11 AM  Result Value Ref Range Status   Specimen Description BLOOD RIGHT ARM  Final   Special Requests IN PEDIATRIC BOTTLE 3CC  Final   Culture   Final    NO GROWTH 2 DAYS Performed at [facility]    Report Status PENDING  Incomplete  Culture, blood (routine x 2)     Status: None (Preliminary result)   Collection Time: 06/12/16  7:11 AM  Result Value Ref Range Status   Specimen Description BLOOD RIGHT HAND  Final   Special Requests IN PEDIATRIC BOTTLE 3CC  Final   Culture   Final    NO GROWTH 2 DAYS Performed at [facility]    Report Status PENDING  Incomplete  MRSA PCR Screening     Status: None   Collection Time: 06/12/16  7:43 AM  Result Value Ref Range Status   MRSA by PCR NEGATIVE NEGATIVE Final    Comment:        The GeneXpert MRSA Assay (FDA approved for NASAL specimens only), is one component of  a comprehensive MRSA colonization surveillance program. It is not intended to diagnose MRSA infection nor to guide or monitor treatment for MRSA infections.   Urine culture     Status: Abnormal   Collection Time: 06/14/16 12:01 AM  Result Value Ref Range Status   Specimen Description URINE, RANDOM  Final   Special Requests NONE  Final   Culture MULTIPLE SPECIES PRESENT, SUGGEST RECOLLECTION (A)  Final   Report  Status 06/15/2016 FINAL  Final         Radiology Studies: Mr Card Morphology Wo/w Cm  Addendum Date: 06/14/2016   ADDENDUM REPORT: 06/14/2016 18:50 ADDENDUM: There is no evidence of the left ventricular thrombus. Tobias AlexanderKatarina Nelson 06/14/2016 Electronically Signed   By: Tobias AlexanderKatarina  Nelson   On: 06/14/2016 18:50   Result Date: 06/14/2016 CLINICAL DATA:  40 year old female with hypertensive emergency and new diagnosis of cardiomyopathy. EXAM: CARDIAC MRI TECHNIQUE: The patient was scanned on a 1.5 Tesla GE magnet. A dedicated cardiac coil was used. Functional imaging was done using Fiesta sequences. 2,3, and 4 chamber views were done to assess for RWMA's. Modified Simpson's rule using a short axis stack was used to calculate an ejection fraction on a dedicated work Research officer, trade unionstation using Circle software. The patient received 24 cc of Multihance. After 10 minutes inversion recovery sequences were used to assess for infiltration and scar tissue. CONTRAST:  24 cc  of Multihance FINDINGS: 1. Moderate left ventricular dilatation with moderate to severe concentric hypertrophy and severely decreased systolic function (LVEF = 20%). There is diffuse hypokinesis. No late gadolinium enhancement was seen. LVEDD:  68 mm LVESD:  64 mm Basal anteroseptal wall:  16 mm Basal inferolateral wall:  13 mm LVEDV:  261 ml LVESV:  208 ml SV:  53 ml CO:  3.6 L/min Myocardial mass:  301 g 2. Normal right ventricular size, thickness and borderline systolic function (RVEF =44%, normal > 45%) with no regional wall motion  abnormalities. 3. Mildly to moderately dilated left atrium. Normal size of the right atrium. 4. Mild to moderate mitral regurgitation, mild tricuspid regurgitation. Mild aortic regurgitation. 5. Normal size of the aortic root, ascending aorta and pulmonary artery. 6.  No pericardial effusion. IMPRESSION: 1. Moderate left ventricular dilatation with moderate concentric hypertrophy and severely decreased systolic function (LVEF = 20%). There is diffuse hypokinesis. No late gadolinium enhancement was seen. 2. Normal right ventricular size, thickness and borderline systolic function (RVEF =44%, normal > 45%) with no regional wall motion abnormalities. 3. Mildly to moderately dilated left atrium. Normal size of the right atrium. 4. Mild to moderate mitral regurgitation, mild tricuspid regurgitation. Mild aortic regurgitation. Collectively, these findings are consistent with advanced stage of hypertensive cardiomyopathy. There is no evidence of ischemic, infiltrative or inflammatory cardiomyopathy. Tobias AlexanderKatarina Nelson Electronically Signed: By: Tobias AlexanderKatarina  Nelson On: 06/14/2016 18:10        Scheduled Meds: . amiodarone  200 mg Oral BID  . aspirin EC  81 mg Oral Daily  . carvedilol  3.125 mg Oral BID WC  . enoxaparin (LOVENOX) injection  40 mg Subcutaneous Q24H  . magnesium sulfate 1 - 4 g bolus IVPB  4 g Intravenous Once  . potassium chloride SA  40 mEq Oral Daily  . sodium chloride flush  3 mL Intravenous Q12H  . spironolactone  12.5 mg Oral Daily   Continuous Infusions:     LOS: 3 days    Time spent: 35min  Genaro Bekker, MD [facility] Pager [phone]  If 7PM-7AM, please contact night-coverage [email or web address] Password [email or web address] 06/15/2016, 11:13 AM

## 2016-06-15 NOTE — Progress Notes (Addendum)
Advanced Heart Failure Rounding Note   Subjective:    Admitted with increased dyspnea and HTN crisis. New acute systolic heart identified on ECHO. LVEF ~25. CMRI no infiltrative/inflamatory process and EF ~25%. Started on carvedilol and entresto. Entresto stopped this morning. Diuresed with IV lasix initially but later transitioned to HCTZ. SBP dropping so amlodipine, hctz and entresto stopped.    Denies SOB   Objective:   Weight Range:  Vital Signs:   Temp:  [97.5 F (36.4 C)-99 F (37.2 C)] 97.7 F (36.5 C) (08/24 0534) Pulse Rate:  [63-78] 70 (08/24 0807) Resp:  [16-21] 20 (08/24 0807) BP: (89-124)/(31-86) 98/86 (08/24 0807) SpO2:  [91 %-98 %] 95 % (08/24 0807) Last BM Date: 06/12/16  Weight change: Filed Weights   06/13/16 0333 06/13/16 2325 06/14/16 0127  Weight: 228 lb 13.4 oz (103.8 kg) 223 lb 5.2 oz (101.3 kg) 223 lb 5.2 oz (101.3 kg)    Intake/Output:   Intake/Output Summary (Last 24 hours) at 06/15/16 0927 Last data filed at 06/15/16 0535  Gross per 24 hour  Intake           961.57 ml  Output              701 ml  Net           260.57 ml     Physical Exam: General:  Well appearing. No resp difficulty. In bed.  HEENT: normal Neck: supple. JVP difficult to assess due body habitus. Does not appear elevated.  . Carotids 2+ bilat; no bruits. No lymphadenopathy or thryomegaly appreciated. Cor: PMI nondisplaced. Regular rate & rhythm. No rubs, gallops. 2/6 TR. Lungs: clear Abdomen: soft, nontender, nondistended. No hepatosplenomegaly. No bruits or masses. Good bowel sounds. Extremities: no cyanosis, clubbing, rash, edema Neuro: alert & orientedx3, cranial nerves grossly intact. moves all 4 extremities w/o difficulty. Affect pleasant  Telemetry: NSR  PVCs   Labs: Basic Metabolic Panel:  Recent Labs Lab 06/12/16 0711 06/12/16 1355 06/13/16 0324 06/14/16 0124 06/14/16 0512 06/15/16 0201  NA 138 138 136 135 135 134*  K 3.6 3.1* 3.0* 3.4* 3.4* 3.7    CL 104 101 101 100* 99* 103  CO2 25 27 27 27 26 25   GLUCOSE 123* 118* 116* 126* 115* 96  BUN 19 19 21* 21* 20 27*  CREATININE 1.31* 1.19* 1.16* 1.24* 1.18* 1.30*  CALCIUM 8.5* 8.3* 8.3* 8.7* 8.8* 8.7*  MG 1.5*  --  1.6*  --   --   --   PHOS 3.1  --  3.7  --   --   --     Liver Function Tests:  Recent Labs Lab 06/12/16 0100 06/12/16 0711  AST 25 20  ALT 18 15  ALKPHOS 89 68  BILITOT 0.6 0.6  PROT 9.1* 7.2  ALBUMIN 4.3 3.5   No results for input(s): LIPASE, AMYLASE in the last 168 hours. No results for input(s): AMMONIA in the last 168 hours.  CBC:  Recent Labs Lab 06/12/16 0100 06/12/16 0110 06/12/16 0711 06/13/16 0324 06/14/16 0512 06/15/16 0201  WBC 22.5*  --  15.6* 11.2* 9.3 9.4  NEUTROABS 9.9*  --  13.1*  --  5.0  --   HGB 17.2* 19.7* 15.1* 14.1 14.9 14.0  HCT 52.7* 58.0* 45.3 43.4 46.7* 44.1  MCV 92.9  --  91.0 91.4 93.6 94.0  PLT 405*  --  296 292 308 283    Cardiac Enzymes:  Recent Labs Lab 06/12/16 0711 06/12/16 1123 06/12/16 1738  TROPONINI 0.37* 0.33* 0.28*    BNP: BNP (last 3 results)  Recent Labs  06/12/16 0041 06/12/16 0711  BNP 1,340.0* 1,413.4*    ProBNP (last 3 results) No results for input(s): PROBNP in the last 8760 hours.    Other results:  Imaging: Mr Card Morphology Wo/w Cm  Addendum Date: 06/14/2016   ADDENDUM REPORT: 06/14/2016 18:50 ADDENDUM: There is no evidence of the left ventricular thrombus. Tobias AlexanderKatarina Nelson 06/14/2016 Electronically Signed   By: Tobias AlexanderKatarina  Nelson   On: 06/14/2016 18:50   Result Date: 06/14/2016 CLINICAL DATA:  40 year old female with hypertensive emergency and new diagnosis of cardiomyopathy. EXAM: CARDIAC MRI TECHNIQUE: The patient was scanned on a 1.5 Tesla GE magnet. A dedicated cardiac coil was used. Functional imaging was done using Fiesta sequences. 2,3, and 4 chamber views were done to assess for RWMA's. Modified Simpson's rule using a short axis stack was used to calculate an ejection  fraction on a dedicated work Research officer, trade unionstation using Circle software. The patient received 24 cc of Multihance. After 10 minutes inversion recovery sequences were used to assess for infiltration and scar tissue. CONTRAST:  24 cc  of Multihance FINDINGS: 1. Moderate left ventricular dilatation with moderate to severe concentric hypertrophy and severely decreased systolic function (LVEF = 20%). There is diffuse hypokinesis. No late gadolinium enhancement was seen. LVEDD:  68 mm LVESD:  64 mm Basal anteroseptal wall:  16 mm Basal inferolateral wall:  13 mm LVEDV:  261 ml LVESV:  208 ml SV:  53 ml CO:  3.6 L/min Myocardial mass:  301 g 2. Normal right ventricular size, thickness and borderline systolic function (RVEF =44%, normal > 45%) with no regional wall motion abnormalities. 3. Mildly to moderately dilated left atrium. Normal size of the right atrium. 4. Mild to moderate mitral regurgitation, mild tricuspid regurgitation. Mild aortic regurgitation. 5. Normal size of the aortic root, ascending aorta and pulmonary artery. 6.  No pericardial effusion. IMPRESSION: 1. Moderate left ventricular dilatation with moderate concentric hypertrophy and severely decreased systolic function (LVEF = 20%). There is diffuse hypokinesis. No late gadolinium enhancement was seen. 2. Normal right ventricular size, thickness and borderline systolic function (RVEF =44%, normal > 45%) with no regional wall motion abnormalities. 3. Mildly to moderately dilated left atrium. Normal size of the right atrium. 4. Mild to moderate mitral regurgitation, mild tricuspid regurgitation. Mild aortic regurgitation. Collectively, these findings are consistent with advanced stage of hypertensive cardiomyopathy. There is no evidence of ischemic, infiltrative or inflammatory cardiomyopathy. Tobias AlexanderKatarina Nelson Electronically Signed: By: Tobias AlexanderKatarina  Nelson On: 06/14/2016 18:10      Medications:     Scheduled Medications: . aspirin EC  81 mg Oral Daily  .  carvedilol  6.25 mg Oral BID WC  . potassium chloride SA  40 mEq Oral Daily  . sodium chloride flush  3 mL Intravenous Q12H     Infusions: . heparin 1,700 Units/hr (06/14/16 2229)     PRN Medications:  sodium chloride, sodium chloride, acetaminophen, hydrALAZINE, nitroGLYCERIN, ondansetron (ZOFRAN) IV, sodium chloride flush   Assessment:  1.Acute Systolic Heart Failure  2. HTN Crisis 3. Frequent PVCs ~16% 4. Hypomangesium 5. Low Reading Level    Plan/Discussion:    Newly diagnosed acute systolic heart failure with EF ~25. No infiltrative/inflammatory/ischemic process on CMRI. Suspect long standing hypertension but will need to check thyroid as well as hepatitis panel.   Volume status stable. Hold off on diuretics. Add 12.5 spiro daily. Cut carvedilol back to 3.125 mg twice a day. SBP  to soft for ARB.   Frequent PVCs noted on monitor. Reviewed by Dr Gala Romney ~16% PVC burden. Start amio 200 mg twice daily. Check TSH. Mag low give 4 grams Mag.   Check renal artery [place].   Refer to cardiac rehab and will need HF Paramedicine for discharged. Limited reading ability. Plan to follow closely in the HF clinic.    Length of Stay: 3   Amy Clegg NP-C  06/15/2016, 9:27 AM  Advanced Heart Failure Team Pager [phone] (M-F; 7a - 4p)  Please contact [facility] for night-coverage after hours (4p -7a ) and weekends on [email or web address]  Patient seen and examined with Tonye Becket, NP. We discussed all aspects of the encounter. I agree with the assessment and plan as stated above.   Echo and cMRI images reviewed personally. She appears to have severe HTN cardiomyopathy but also has frequent PVCs which may be contributing. BP now very low so meds and diuretics stopped. Will start amio for suppression of PVCs. Reintroduce HF meds and diuretics slowly over next day or two with goal SBP 120-140 for now. Renal artery u/s negative on 8/22. Will need regular renal u/s to evaluate for cortical thinning.  No LV thrombus on MRI. Can stop heparin.  We will follow. D/w Dr. Jomarie Longs.   Bensimhon, Daniel,MD 10:34 PM

## 2016-06-16 ENCOUNTER — Inpatient Hospital Stay (HOSPITAL_COMMUNITY): Payer: Self-pay

## 2016-06-16 LAB — CBC
HEMATOCRIT: 42.7 % (ref 36.0–46.0)
HEMOGLOBIN: 13.4 g/dL (ref 12.0–15.0)
MCH: 29.6 pg (ref 26.0–34.0)
MCHC: 31.4 g/dL (ref 30.0–36.0)
MCV: 94.3 fL (ref 78.0–100.0)
Platelets: 282 10*3/uL (ref 150–400)
RBC: 4.53 MIL/uL (ref 3.87–5.11)
RDW: 14.1 % (ref 11.5–15.5)
WBC: 7.8 10*3/uL (ref 4.0–10.5)

## 2016-06-16 LAB — BASIC METABOLIC PANEL
ANION GAP: 7 (ref 5–15)
BUN: 18 mg/dL (ref 6–20)
CO2: 25 mmol/L (ref 22–32)
Calcium: 8.6 mg/dL — ABNORMAL LOW (ref 8.9–10.3)
Chloride: 103 mmol/L (ref 101–111)
Creatinine, Ser: 1.16 mg/dL — ABNORMAL HIGH (ref 0.44–1.00)
GFR calc Af Amer: >60 mL/min (ref >60)
GFR calc non Af Amer: 57 mL/min — ABNORMAL LOW (ref >60)
GLUCOSE: 81 mg/dL (ref 65–99)
POTASSIUM: 3.7 mmol/L (ref 3.5–5.1)
Sodium: 135 mmol/L (ref 135–145)

## 2016-06-16 LAB — TSH: TSH: 3.01 u[IU]/mL (ref 0.350–4.500)

## 2016-06-16 LAB — MAGNESIUM: Magnesium: 2.1 mg/dL (ref 1.7–2.4)

## 2016-06-16 MED ORDER — LOSARTAN POTASSIUM 25 MG PO TABS
25.0000 mg | ORAL_TABLET | Freq: Every day | ORAL | Status: DC
  Administered 2016-06-16 – 2016-06-17 (×2): 25 mg via ORAL
  Filled 2016-06-16 (×2): qty 1

## 2016-06-16 MED ORDER — SACUBITRIL-VALSARTAN 24-26 MG PO TABS
1.0000 | ORAL_TABLET | Freq: Two times a day (BID) | ORAL | Status: DC
  Filled 2016-06-16: qty 1

## 2016-06-16 MED ORDER — SPIRONOLACTONE 25 MG PO TABS
25.0000 mg | ORAL_TABLET | Freq: Every day | ORAL | Status: DC
  Administered 2016-06-16 – 2016-06-17 (×2): 25 mg via ORAL
  Filled 2016-06-16 (×2): qty 1

## 2016-06-16 NOTE — Progress Notes (Signed)
Pt slept well at night, no any complain of SOB and distress noted, no any specific complain of pain as well, will continue to monitor.

## 2016-06-16 NOTE — Progress Notes (Signed)
PROGRESS NOTE    Mandy Serrano  WUJ:[id number] DOB: Jan 13, 1976 DOA: 06/12/2016 PCP: No PCP Per Patient   Brief Narrative: Hypertensive emergency complicated by cardiogenic pulmonary edema and hypoxic respiratory failure. 40 yo female with long standing hypertension, has been off blood pressure medications for 1 month. Admitted to step down unit, started on nitroglycerin and nicardipine infusion. Aggressive diuresis.    Assessment & Plan:   1. Hypertensive emergency. -required nitroglycerin and nicardipine drip, now off -Blood pressure dropped 8/23 pm, hence stopped amlodipine 10 mg, hydralazine to 50 mg tid and entresto and cut down coreg dose -BP stable now and restarted Coreg, Cozaar and aldactone  2. Acute Pulm edema/Acute systolic CHF.  -improved with diuresis, now euvolemic -Echocardiogram with significant reduction on LV systolic function down to 25 to 30%. With global hypokinesis, with possible left ventricle thrombus. MRI negative for LV thrombus -Cards following, continue coreg and low dose aldactone -LV thrombus ruled out with MRI and hence heparin stopped  4. AKI.  -mild due to diuresis, ARB, monitor -renal artery duplex negative for RAS, renal US; evidence of medical renal disease  5. Reactive leukytosis.  -resolved  DVT prophylaxis:  Lovenox Code Status: full Family Communication: no family at the bedside Disposition Plan: Home tomorrow if stable  Consultants:   CHF team  Procedures:   Antimicrobials:   Subjective: Feels better, no complaints  Objective: Vitals:   06/15/16 1524 06/15/16 2054 06/16/16 0007 06/16/16 0524  BP: 123/72 102/82 121/82 (!) 143/88  Pulse: 68 72 75 79  Resp: 18 18 18 18   Temp: 97.7 F (36.5 C) 98 F (36.7 C) 97.6 F (36.4 C) 98 F (36.7 C)  TempSrc: Oral Oral Oral Oral  SpO2: 94% 97% 100% 100%  Weight: 102.4 kg (225 lb 11.2 oz)   101.8 kg (224 lb 8 oz)  Height: 5\' 2"  (1.575 m)       Intake/Output Summary (Last 24  hours) at 06/16/16 1156 Last data filed at 06/16/16 0446  Gross per 24 hour  Intake              540 ml  Output              700 ml  Net             -160 ml   Filed Weights   06/14/16 0127 06/15/16 1524 06/16/16 0524  Weight: 101.3 kg (223 lb 5.2 oz) 102.4 kg (225 lb 11.2 oz) 101.8 kg (224 lb 8 oz)    Examination:  General exam: AAOx3 E ENT: oral mucosa moist, no conjunctival pallor or icterus. Respiratory system: Respiratory effort normal. Vesicular breath sounds bilaterally, no wheezing, rales or rhonchi.  Cardiovascular system: S1 & S2 heard, RRR. No JVD, murmurs, rubs, gallops or clicks. No pedal edema. Gastrointestinal system: Abdomen is nondistended, soft and nontender. No organomegaly or masses felt. Normal bowel sounds heard. Central nervous system: Alert and oriented. No focal neurological deficits. Extremities: Symmetric 5 x 5 power. Skin: No rashes, lesions or ulcers Psychiatry: Judgement and insight appear normal. Mood & affect appropriate.     Data Reviewed: I have personally reviewed following labs and imaging studies  CBC:  Recent Labs Lab 06/12/16 0100  06/12/16 0711 06/13/16 0324 06/14/16 0512 06/15/16 0201 06/16/16 0304  WBC 22.5*  --  15.6* 11.2* 9.3 9.4 7.8  NEUTROABS 9.9*  --  13.1*  --  5.0  --   --   HGB 17.2*  < > 15.1* 14.1 14.9 14.0 13.4  HCT 52.7*  < > 45.3 43.4 46.7* 44.1 42.7  MCV 92.9  --  91.0 91.4 93.6 94.0 94.3  PLT 405*  --  296 292 308 283 282  < > = values in this interval not displayed. Basic Metabolic Panel:  Recent Labs Lab 06/12/16 0711  06/13/16 0324 06/14/16 0124 06/14/16 0512 06/15/16 0201 06/16/16 0304  NA 138  < > 136 135 135 134* 135  K 3.6  < > 3.0* 3.4* 3.4* 3.7 3.7  CL 104  < > 101 100* 99* 103 103  CO2 25  < > 27 27 26 25 25   GLUCOSE 123*  < > 116* 126* 115* 96 81  BUN 19  < > 21* 21* 20 27* 18  CREATININE 1.31*  < > 1.16* 1.24* 1.18* 1.30* 1.16*  CALCIUM 8.5*  < > 8.3* 8.7* 8.8* 8.7* 8.6*  MG 1.5*  --   1.6*  --   --   --  2.1  PHOS 3.1  --  3.7  --   --   --   --   < > = values in this interval not displayed. GFR: Estimated Creatinine Clearance: 70.6 mL/min (by C-G formula based on SCr of 1.16 mg/dL). Liver Function Tests:  Recent Labs Lab 06/12/16 0100 06/12/16 0711  AST 25 20  ALT 18 15  ALKPHOS 89 68  BILITOT 0.6 0.6  PROT 9.1* 7.2  ALBUMIN 4.3 3.5   No results for input(s): LIPASE, AMYLASE in the last 168 hours. No results for input(s): AMMONIA in the last 168 hours. Coagulation Profile:  Recent Labs Lab 06/13/16 1954  INR 1.10   Cardiac Enzymes:  Recent Labs Lab 06/12/16 0711 06/12/16 1123 06/12/16 1738  TROPONINI 0.37* 0.33* 0.28*   BNP (last 3 results) No results for input(s): PROBNP in the last 8760 hours. HbA1C: No results for input(s): HGBA1C in the last 72 hours. CBG: No results for input(s): GLUCAP in the last 168 hours. Lipid Profile: No results for input(s): CHOL, HDL, LDLCALC, TRIG, CHOLHDL, LDLDIRECT in the last 72 hours. Thyroid Function Tests:  Recent Labs  06/16/16 0304  TSH 3.010   Anemia Panel: No results for input(s): VITAMINB12, FOLATE, FERRITIN, TIBC, IRON, RETICCTPCT in the last 72 hours. Sepsis Labs:  Recent Labs Lab 06/12/16 0711 06/12/16 0720  PROCALCITON 0.52  --   LATICACIDVEN  --  1.9    Recent Results (from the past 240 hour(s))  Culture, blood (routine x 2)     Status: None (Preliminary result)   Collection Time: 06/12/16  7:11 AM  Result Value Ref Range Status   Specimen Description BLOOD RIGHT ARM  Final   Special Requests IN PEDIATRIC BOTTLE 3CC  Final   Culture   Final    NO GROWTH 3 DAYS Performed at [facility]    Report Status PENDING  Incomplete  Culture, blood (routine x 2)     Status: None (Preliminary result)   Collection Time: 06/12/16  7:11 AM  Result Value Ref Range Status   Specimen Description BLOOD RIGHT HAND  Final   Special Requests IN PEDIATRIC BOTTLE 3CC  Final   Culture    Final    NO GROWTH 3 DAYS Performed at [facility]    Report Status PENDING  Incomplete  MRSA PCR Screening     Status: None   Collection Time: 06/12/16  7:43 AM  Result Value Ref Range Status   MRSA by PCR NEGATIVE NEGATIVE Final  Comment:        The GeneXpert MRSA Assay (FDA approved for NASAL specimens only), is one component of a comprehensive MRSA colonization surveillance program. It is not intended to diagnose MRSA infection nor to guide or monitor treatment for MRSA infections.   Urine culture     Status: Abnormal   Collection Time: 06/14/16 12:01 AM  Result Value Ref Range Status   Specimen Description URINE, RANDOM  Final   Special Requests NONE  Final   Culture MULTIPLE SPECIES PRESENT, SUGGEST RECOLLECTION (A)  Final   Report Status 06/15/2016 FINAL  Final         Radiology Studies: [place] Renal  Result Date: 06/16/2016 CLINICAL DATA:  Renal failure, hypertensive crisis on June 12, 2016, systolic heart failure. EXAM: RENAL / URINARY TRACT ULTRASOUND COMPLETE COMPARISON:  None in PACs FINDINGS: Right Kidney: Length: 10.7 cm. The renal cortical echotexture is increased and is equal to or slightly greater than that of the liver. There is no focal mass nor hydronephrosis. Left Kidney: Length: 11.0 cm. The renal cortical echotexture is mildly increased similar to that on the right. There is no focal mass nor hydronephrosis. Bladder: The partially distended urinary bladder is normal. Ureteral jets are observed bilaterally. IMPRESSION: Increased renal cortical echotexture consistent with medical renal disease. There is no hydronephrosis. The urinary bladder is unremarkable. Electronically Signed   By: David  [place] M.D.   On: 06/16/2016 09:33   Mr Card Morphology Wo/w Cm  Addendum Date: 06/14/2016   ADDENDUM REPORT: 06/14/2016 18:50 ADDENDUM: There is no evidence of the left ventricular thrombus. Tobias AlexanderKatarina Nelson 06/14/2016 Electronically Signed   By: Tobias AlexanderKatarina   Nelson   On: 06/14/2016 18:50   Result Date: 06/14/2016 CLINICAL DATA:  40 year old female with hypertensive emergency and new diagnosis of cardiomyopathy. EXAM: CARDIAC MRI TECHNIQUE: The patient was scanned on a 1.5 Tesla GE magnet. A dedicated cardiac coil was used. Functional imaging was done using Fiesta sequences. 2,3, and 4 chamber views were done to assess for RWMA's. Modified Simpson's rule using a short axis stack was used to calculate an ejection fraction on a dedicated work Research officer, trade unionstation using Circle software. The patient received 24 cc of Multihance. After 10 minutes inversion recovery sequences were used to assess for infiltration and scar tissue. CONTRAST:  24 cc  of Multihance FINDINGS: 1. Moderate left ventricular dilatation with moderate to severe concentric hypertrophy and severely decreased systolic function (LVEF = 20%). There is diffuse hypokinesis. No late gadolinium enhancement was seen. LVEDD:  68 mm LVESD:  64 mm Basal anteroseptal wall:  16 mm Basal inferolateral wall:  13 mm LVEDV:  261 ml LVESV:  208 ml SV:  53 ml CO:  3.6 L/min Myocardial mass:  301 g 2. Normal right ventricular size, thickness and borderline systolic function (RVEF =44%, normal > 45%) with no regional wall motion abnormalities. 3. Mildly to moderately dilated left atrium. Normal size of the right atrium. 4. Mild to moderate mitral regurgitation, mild tricuspid regurgitation. Mild aortic regurgitation. 5. Normal size of the aortic root, ascending aorta and pulmonary artery. 6.  No pericardial effusion. IMPRESSION: 1. Moderate left ventricular dilatation with moderate concentric hypertrophy and severely decreased systolic function (LVEF = 20%). There is diffuse hypokinesis. No late gadolinium enhancement was seen. 2. Normal right ventricular size, thickness and borderline systolic function (RVEF =44%, normal > 45%) with no regional wall motion abnormalities. 3. Mildly to moderately dilated left atrium. Normal size of the  right atrium. 4. Mild to moderate mitral  regurgitation, mild tricuspid regurgitation. Mild aortic regurgitation. Collectively, these findings are consistent with advanced stage of hypertensive cardiomyopathy. There is no evidence of ischemic, infiltrative or inflammatory cardiomyopathy. Tobias Alexander Electronically Signed: By: Tobias Alexander On: 06/14/2016 18:10        Scheduled Meds: . amiodarone  200 mg Oral BID  . aspirin EC  81 mg Oral Daily  . carvedilol  3.125 mg Oral BID WC  . enoxaparin (LOVENOX) injection  40 mg Subcutaneous Q24H  . losartan  25 mg Oral Daily  . sodium chloride flush  3 mL Intravenous Q12H  . spironolactone  25 mg Oral Daily   Continuous Infusions:     LOS: 4 days    Time spent:  Chaunte Hornbeck, MD [facility] Pager [phone]  If 7PM-7AM, please contact night-coverage [email or web address] Password [facility] 06/16/2016, 11:56 AM

## 2016-06-16 NOTE — Progress Notes (Signed)
Advanced Heart Failure Rounding Note   Subjective:    Admitted with increased dyspnea and HTN crisis. New acute systolic heart identified on ECHO. LVEF ~25. CMRI no infiltrative/inflamatory process and EF ~25%.   HF meds and diuretics on hold due to dropping BP.  Started on amio 8/24 for frequent PVCs (~16%)  Feel better. No SOB. Anxious to go home. Renal artery u/s no RAS. SBP 140 this am.    Objective:   Weight Range:  Vital Signs:   Temp:  [97.6 F (36.4 C)-98.2 F (36.8 C)] 98 F (36.7 C) (08/25 0524) Pulse Rate:  [64-82] 79 (08/25 0524) Resp:  [16-21] 18 (08/25 0524) BP: (97-143)/(72-88) 143/88 (08/25 0524) SpO2:  [93 %-100 %] 100 % (08/25 0524) Weight:  [101.8 kg (224 lb 8 oz)-102.4 kg (225 lb 11.2 oz)] 101.8 kg (224 lb 8 oz) (08/25 0524) Last BM Date: 06/15/16  Weight change: Filed Weights   06/14/16 0127 06/15/16 1524 06/16/16 0524  Weight: 101.3 kg (223 lb 5.2 oz) 102.4 kg (225 lb 11.2 oz) 101.8 kg (224 lb 8 oz)    Intake/Output:   Intake/Output Summary (Last 24 hours) at 06/16/16 0755 Last data filed at 06/16/16 0446  Gross per 24 hour  Intake              540 ml  Output              700 ml  Net             -160 ml     Physical Exam: General:  Well appearing. No resp difficulty. In bed.  HEENT: normal Neck: supple. JVP difficult to assess due body habitus. Does not appear elevated.  . Carotids 2+ bilat; no bruits. No lymphadenopathy or thryomegaly appreciated. Cor: PMI nondisplaced. Regular rate & rhythm. No rubs, gallops. 2/6 TR. Lungs: clear Abdomen: soft, nontender, nondistended. No hepatosplenomegaly. No bruits or masses. Good bowel sounds. Extremities: no cyanosis, clubbing, rash, edema Neuro: alert & orientedx3, cranial nerves grossly intact. moves all 4 extremities w/o difficulty. Affect pleasant  Telemetry: NSR  PVCs   Labs: Basic Metabolic Panel:  Recent Labs Lab 06/12/16 0711  06/13/16 0324 06/14/16 0124 06/14/16 0512  06/15/16 0201 06/16/16 0304  NA 138  < > 136 135 135 134* 135  K 3.6  < > 3.0* 3.4* 3.4* 3.7 3.7  CL 104  < > 101 100* 99* 103 103  CO2 25  < > 27 27 26 25 25   GLUCOSE 123*  < > 116* 126* 115* 96 81  BUN 19  < > 21* 21* 20 27* 18  CREATININE 1.31*  < > 1.16* 1.24* 1.18* 1.30* 1.16*  CALCIUM 8.5*  < > 8.3* 8.7* 8.8* 8.7* 8.6*  MG 1.5*  --  1.6*  --   --   --  2.1  PHOS 3.1  --  3.7  --   --   --   --   < > = values in this interval not displayed.  Liver Function Tests:  Recent Labs Lab 06/12/16 0100 06/12/16 0711  AST 25 20  ALT 18 15  ALKPHOS 89 68  BILITOT 0.6 0.6  PROT 9.1* 7.2  ALBUMIN 4.3 3.5   No results for input(s): LIPASE, AMYLASE in the last 168 hours. No results for input(s): AMMONIA in the last 168 hours.  CBC:  Recent Labs Lab 06/12/16 0100  06/12/16 0711 06/13/16 0324 06/14/16 0512 06/15/16 0201 06/16/16 0304  WBC 22.5*  --  15.6* 11.2* 9.3 9.4 7.8  NEUTROABS 9.9*  --  13.1*  --  5.0  --   --   HGB 17.2*  < > 15.1* 14.1 14.9 14.0 13.4  HCT 52.7*  < > 45.3 43.4 46.7* 44.1 42.7  MCV 92.9  --  91.0 91.4 93.6 94.0 94.3  PLT 405*  --  296 292 308 283 282  < > = values in this interval not displayed.  Cardiac Enzymes:  Recent Labs Lab 06/12/16 0711 06/12/16 1123 06/12/16 1738  TROPONINI 0.37* 0.33* 0.28*    BNP: BNP (last 3 results)  Recent Labs  06/12/16 0041 06/12/16 0711  BNP 1,340.0* 1,413.4*    ProBNP (last 3 results) No results for input(s): PROBNP in the last 8760 hours.    Other results:  Imaging: Mr Card Morphology Wo/w Cm  Addendum Date: 06/14/2016   ADDENDUM REPORT: 06/14/2016 18:50 ADDENDUM: There is no evidence of the left ventricular thrombus. Tobias Alexander 06/14/2016 Electronically Signed   By: Tobias Alexander   On: 06/14/2016 18:50   Result Date: 06/14/2016 CLINICAL DATA:  40 year old female with hypertensive emergency and new diagnosis of cardiomyopathy. EXAM: CARDIAC MRI TECHNIQUE: The patient was scanned on a  1.5 Tesla GE magnet. A dedicated cardiac coil was used. Functional imaging was done using Fiesta sequences. 2,3, and 4 chamber views were done to assess for RWMA's. Modified Simpson's rule using a short axis stack was used to calculate an ejection fraction on a dedicated work [employer] officer, [employer]. The patient received 24 cc of Multihance. After 10 minutes inversion recovery sequences were used to assess for infiltration and scar tissue. CONTRAST:  24 cc  of Multihance FINDINGS: 1. Moderate left ventricular dilatation with moderate to severe concentric hypertrophy and severely decreased systolic function (LVEF = 20%). There is diffuse hypokinesis. No late gadolinium enhancement was seen. LVEDD:  68 mm LVESD:  64 mm Basal anteroseptal wall:  16 mm Basal inferolateral wall:  13 mm LVEDV:  261 ml LVESV:  208 ml SV:  53 ml CO:  3.6 L/min Myocardial mass:  301 g 2. Normal right ventricular size, thickness and borderline systolic function (RVEF =44%, normal > 45%) with no regional wall motion abnormalities. 3. Mildly to moderately dilated left atrium. Normal size of the right atrium. 4. Mild to moderate mitral regurgitation, mild tricuspid regurgitation. Mild aortic regurgitation. 5. Normal size of the aortic root, ascending aorta and pulmonary artery. 6.  No pericardial effusion. IMPRESSION: 1. Moderate left ventricular dilatation with moderate concentric hypertrophy and severely decreased systolic function (LVEF = 20%). There is diffuse hypokinesis. No late gadolinium enhancement was seen. 2. Normal right ventricular size, thickness and borderline systolic function (RVEF =44%, normal > 45%) with no regional wall motion abnormalities. 3. Mildly to moderately dilated left atrium. Normal size of the right atrium. 4. Mild to moderate mitral regurgitation, mild tricuspid regurgitation. Mild aortic regurgitation. Collectively, these findings are consistent with advanced stage of hypertensive cardiomyopathy. There is  no evidence of ischemic, infiltrative or inflammatory cardiomyopathy. Tobias Alexander Electronically Signed: By: Tobias Alexander On: 06/14/2016 18:10     Medications:     Scheduled Medications: . amiodarone  200 mg Oral BID  . aspirin EC  81 mg Oral Daily  . carvedilol  3.125 mg Oral BID WC  . enoxaparin (LOVENOX) injection  40 mg Subcutaneous Q24H  . potassium chloride SA  40 mEq Oral Daily  . sodium chloride flush  3 mL Intravenous Q12H  . spironolactone  12.5  mg Oral Daily    Infusions:    PRN Medications: sodium chloride, sodium chloride, acetaminophen, hydrALAZINE, nitroGLYCERIN, ondansetron (ZOFRAN) IV, sodium chloride flush   Assessment:  1.Acute Systolic Heart Failure  2. HTN Crisis 3. Frequent PVCs ~16% 4. Hypomangesium 5. Low Reading Level    Plan/Discussion:     Newly diagnosed acute systolic heart failure with EF ~25. No infiltrative/inflammatory/ischemic process on CMRI. Suspect long standing hypertension +/- PVC induced.   Volume status stable. Hold off on diuretics. Con spiro 12.5 daily and carvedilol 3.125 bid. Add Entresto 24/26 bid. Will need medication assistance.  12.5 spiro daily. Cut carvedilol back to 3.125 mg twice a day. SBP to soft for ARB.   Continue amio 200 mg twice daily to suppress PVCs. TSH ok. Mag improved.   Check renal u/s  Possibly home tomorrow.   Refer to cardiac rehab and will need HF Paramedicine for discharged. Limited reading ability. Plan to follow closely in the HF clinic.    Length of Stay: 4   Dimetri Armitage MD 06/16/2016, 7:55 AM  Advanced Heart Failure Team Pager [phone] (M-F; 7a - 4p)  Please contact [facility] for night-coverage after hours (4p -7a ) and weekends on [email or web address]

## 2016-06-16 NOTE — Progress Notes (Addendum)
Pt not eligible for [facility] or any other medication assistance program.    Stop Entresto. Will use 25 mg losartan daily for now instead.   Follow up in HF clinic made for 06/22/16 at 1100 am.   Casimiro Needle "Otilio Saber, PA-C 06/16/2016 10:51 AM

## 2016-06-16 NOTE — Progress Notes (Signed)
CARDIAC REHAB PHASE I   PRE:  Rate/Rhythm: 82 SR    BP: sitting  148/92    SaO2: 100 RA  MODE:  Ambulation: 760 ft   POST:  Rate/Rhythm: 95 SR with occ PVC    BP: sitting 133/91     SaO2: 100 RA  Pt tolerated well, no c/o SOB. Sts she feels well. PVCs not noted until end of walk. Pt able to perform teach back on low sodium and daily wts. Encouraged to see her cardiologist when she returns to [place]. [phone]   Mandy Serrano CES, ACSM 06/16/2016 9:51 AM

## 2016-06-17 ENCOUNTER — Encounter (HOSPITAL_COMMUNITY): Payer: Self-pay

## 2016-06-17 LAB — CULTURE, BLOOD (ROUTINE X 2)
CULTURE: NO GROWTH
CULTURE: NO GROWTH

## 2016-06-17 LAB — BASIC METABOLIC PANEL
ANION GAP: 7 (ref 5–15)
BUN: 16 mg/dL (ref 6–20)
CALCIUM: 9.1 mg/dL (ref 8.9–10.3)
CHLORIDE: 104 mmol/L (ref 101–111)
CO2: 24 mmol/L (ref 22–32)
Creatinine, Ser: 1.18 mg/dL — ABNORMAL HIGH (ref 0.44–1.00)
GFR calc Af Amer: >60 mL/min (ref >60)
GFR calc non Af Amer: 56 mL/min — ABNORMAL LOW (ref >60)
GLUCOSE: 107 mg/dL — AB (ref 65–99)
Potassium: 4.2 mmol/L (ref 3.5–5.1)
Sodium: 135 mmol/L (ref 135–145)

## 2016-06-17 MED ORDER — LOSARTAN POTASSIUM 50 MG PO TABS: 50.0000 mg | ORAL_TABLET | Freq: Every day | ORAL | 0 refills | Status: DC

## 2016-06-17 MED ORDER — SPIRONOLACTONE 25 MG PO TABS: 25.0000 mg | ORAL_TABLET | Freq: Every day | ORAL | 0 refills | Status: DC

## 2016-06-17 MED ORDER — CARVEDILOL 3.125 MG PO TABS: 3.1250 mg | ORAL_TABLET | Freq: Two times a day (BID) | ORAL | 0 refills | Status: DC

## 2016-06-17 MED ORDER — LOSARTAN POTASSIUM 100 MG PO TABS: 100.0000 mg | ORAL_TABLET | Freq: Every day | ORAL | 0 refills | Status: DC

## 2016-06-17 MED ORDER — AMIODARONE HCL 200 MG PO TABS: 200.0000 mg | ORAL_TABLET | Freq: Two times a day (BID) | ORAL | 0 refills | Status: DC

## 2016-06-17 MED ORDER — LOSARTAN POTASSIUM 50 MG PO TABS
50.0000 mg | ORAL_TABLET | Freq: Every day | ORAL | Status: DC
  Administered 2016-06-17: 50 mg via ORAL
  Filled 2016-06-17: qty 1

## 2016-06-17 MED ORDER — LOSARTAN POTASSIUM 25 MG PO TABS: 25.0000 mg | ORAL_TABLET | Freq: Every day | ORAL | Status: DC

## 2016-06-17 MED ORDER — ASPIRIN 81 MG PO TBEC: 81.0000 mg | DELAYED_RELEASE_TABLET | Freq: Every day | ORAL | Status: DC

## 2016-06-17 MED ORDER — LOSARTAN POTASSIUM 25 MG PO TABS: 25.0000 mg | ORAL_TABLET | Freq: Every day | ORAL | 0 refills | Status: DC

## 2016-06-17 MED ORDER — CARVEDILOL 6.25 MG PO TABS: 3.1250 mg | ORAL_TABLET | Freq: Two times a day (BID) | ORAL | 0 refills | Status: DC

## 2016-06-17 NOTE — Discharge Summary (Signed)
Physician Discharge Summary  Mandy Serrano AVW:[id number] DOB: 11/10/1975 DOA: 06/12/2016  PCP: No PCP Per Patient  Admit date: 06/12/2016 Discharge date: 06/17/2016  Time spent: 45 minutes  Recommendations for Outpatient Follow-up:  1. CHF clinic on 8/31 at 11am, monitor BP, volume status and Bmet for potassium 2. Monitor LFTS/TSH/pulm status on amiodarone   Discharge Diagnoses:  Active Problems:   Malignant hypertension   Acute pulmonary edema (HCC)   Acute systolic CHF (congestive heart failure) (HCC)   Frequent PVCs   Discharge Condition: stable  Diet recommendation: low sodium  Filed Weights   06/15/16 1524 06/16/16 0524 06/17/16 0515  Weight: 102.4 kg (225 lb 11.2 oz) 101.8 kg (224 lb 8 oz) 102.1 kg (225 lb)    History of present illness:  Mandy Serrano is a 40 year old with past medical history of hypertension. She presented to the ED with acute onset of dyspnea for 1 day. In the ED she had bilateral crackles with pulmonary edema on chest x-ray. Systolic blood pressures in the [facility] Course:  . Hypertensive emergency. -required nitroglycerin and nicardipine drip, now off -Blood pressure dropped 8/23 pm, hence stopped amlodipine 10 mg, hydralazine and entresto and cut down coreg dose -BP improved and trended up again and restarted Coreg, Cozaar and aldactone, dose titrated up again prior to discharge  2. Acute Pulm edema/Acute systolic CHF-New diagnosis -improved with diuresis, now euvolemic -Echocardiogram with significant reduction on LV systolic function down to 25 to 30%.  Cardiac MRI showed No infiltrative/inflammatory/ischemic process, we suspect long standing hypertension +/- PVC induced -MRI was negative for LV thrombus, as suspected on ECHO -Cardiology consulted, volume status improved after 1 day of diuresis, BP improving but still some fluctuation, now on Coreg, cozaar increased to 100mg  and aldactone 25mg . -FU made with CHF team on 8/31 at  11am -she was also noted to have a large amount of PVCs on tele and hence was started on amiodarone by Dr.Bensomhon  4. AKI.  -mild due to diuresis, ARB, monitor -renal artery duplex negative for RAS, renal US; evidence of medical renal disease  5. Reactive leukytosis.  -resolved  Consultations:  CHF Team  Discharge Exam: Vitals:   06/17/16 0738 06/17/16 1225  BP: (!) 172/108 (!) 163/84  Pulse: 82 85  Resp: 18 20  Temp: 98.2 F (36.8 C) 97.8 F (36.6 C)    General: AAOx3 Cardiovascular: S1S2/RRR Respiratory: CTAB  Discharge Instructions   Discharge Instructions    Diet - low sodium heart healthy    Complete by:  As directed   Increase activity slowly    Complete by:  As directed     Current Discharge Medication List    START taking these medications   Details  amiodarone (PACERONE) 200 MG tablet Take 1 tablet (200 mg total) by mouth 2 (two) times daily. Qty: 60 tablet, Refills: 0    aspirin EC 81 MG EC tablet Take 1 tablet (81 mg total) by mouth daily.    carvedilol (COREG) 6.25 MG tablet Take 0.5 tablets (3.125 mg total) by mouth 2 (two) times daily with a meal. Qty: 60 tablet, Refills: 0    losartan (COZAAR) 100 MG tablet Take 1 tablet (100 mg total) by mouth daily. Qty: 30 tablet, Refills: 0    spironolactone (ALDACTONE) 25 MG tablet Take 1 tablet (25 mg total) by mouth daily. Qty: 30 tablet, Refills: 0      CONTINUE these medications which have NOT CHANGED   Details  acetaminophen (TYLENOL) 325 MG  tablet Take 325 mg by mouth every 6 (six) hours as needed for headache.      STOP taking these medications     ibuprofen (ADVIL,MOTRIN) 200 MG tablet        No Known Allergies Follow-up Information    [facility] Follow up on 06/22/2016.   Specialty:  Cardiology Why:  at 1100 am for post hospital follow up. Please bring all of your medications to your visit. The code for parking is 0003. Contact  information: [address] [id number] mc [place] [id number] [phone]           The results of significant diagnostics from this hospitalization (including imaging, microbiology, ancillary and laboratory) are listed below for reference.    Significant Diagnostic Studies: [place] Renal  Result Date: 06/16/2016 CLINICAL DATA:  Renal failure, hypertensive crisis on June 12, 2016, systolic heart failure. EXAM: RENAL / URINARY TRACT ULTRASOUND COMPLETE COMPARISON:  None in PACs FINDINGS: Right Kidney: Length: 10.7 cm. The renal cortical echotexture is increased and is equal to or slightly greater than that of the liver. There is no focal mass nor hydronephrosis. Left Kidney: Length: 11.0 cm. The renal cortical echotexture is mildly increased similar to that on the right. There is no focal mass nor hydronephrosis. Bladder: The partially distended urinary bladder is normal. Ureteral jets are observed bilaterally. IMPRESSION: Increased renal cortical echotexture consistent with medical renal disease. There is no hydronephrosis. The urinary bladder is unremarkable. Electronically Signed   By: David  SwazilandJordan M.D.   On: 06/16/2016 09:33   Dg Chest Port 1 View  Result Date: 06/13/2016 CLINICAL DATA:  Shortness of breath . EXAM: PORTABLE CHEST 1 VIEW COMPARISON:  06/12/2016. FINDINGS: Mediastinum hilar structures normal. Cardiomegaly with mild pulmonary vascular prominence and bilateral interstitial prominence. Findings have improved prior exam. Small left pleural effusion cannot be excluded. IMPRESSION: Cardiomegaly with bilateral from interstitial prominence and small left pleural effusion. Findings have improved from prior exam. Findings are consistent with clearing congestive heart failure. Electronically Signed   By: Maisie Fushomas  Register   On: 06/13/2016 07:14   Dg Chest Portable 1 View  Result Date: 06/12/2016 CLINICAL DATA:  Acute shortness of breath.  Hypoxia and tachycardia.  EXAM: PORTABLE CHEST 1 VIEW COMPARISON:  None. FINDINGS: Mild enlargement of cardiac silhouette. Mediastinal contours are normal. There diffuse bilateral pulmonary opacities in the a slightly perihilar predominant distribution. No definite pleural effusion. Probable left basilar atelectasis. No pneumothorax. No acute osseous abnormality is seen. IMPRESSION: Diffuse bilateral pulmonary opacities, concerning for pulmonary edema versus multifocal pneumonia. Given associated enlargement of cardiac silhouette, pulmonary edema is favored. Electronically Signed   By: Rubye OaksMelanie  Ehinger M.D.   On: 06/12/2016 01:28   Mr Card Morphology Wo/w Cm  Addendum Date: 06/14/2016   ADDENDUM REPORT: 06/14/2016 18:50 ADDENDUM: There is no evidence of the left ventricular thrombus. Tobias AlexanderKatarina Nelson 06/14/2016 Electronically Signed   By: Tobias AlexanderKatarina  Nelson   On: 06/14/2016 18:50   Result Date: 06/14/2016 CLINICAL DATA:  40 year old female with hypertensive emergency and new diagnosis of cardiomyopathy. EXAM: CARDIAC MRI TECHNIQUE: The patient was scanned on a 1.5 Tesla GE magnet. A dedicated cardiac coil was used. Functional imaging was done using Fiesta sequences. 2,3, and 4 chamber views were done to assess for RWMA's. Modified Simpson's rule using a short axis stack was used to calculate an ejection fraction on a dedicated work Research officer, trade unionstation using Circle software. The patient received 24 cc of Multihance. After 10 minutes  inversion recovery sequences were used to assess for infiltration and scar tissue. CONTRAST:  24 cc  of Multihance FINDINGS: 1. Moderate left ventricular dilatation with moderate to severe concentric hypertrophy and severely decreased systolic function (LVEF = 20%). There is diffuse hypokinesis. No late gadolinium enhancement was seen. LVEDD:  68 mm LVESD:  64 mm Basal anteroseptal wall:  16 mm Basal inferolateral wall:  13 mm LVEDV:  261 ml LVESV:  208 ml SV:  53 ml CO:  3.6 L/min Myocardial mass:  301 g 2. Normal right  ventricular size, thickness and borderline systolic function (RVEF =44%, normal > 45%) with no regional wall motion abnormalities. 3. Mildly to moderately dilated left atrium. Normal size of the right atrium. 4. Mild to moderate mitral regurgitation, mild tricuspid regurgitation. Mild aortic regurgitation. 5. Normal size of the aortic root, ascending aorta and pulmonary artery. 6.  No pericardial effusion. IMPRESSION: 1. Moderate left ventricular dilatation with moderate concentric hypertrophy and severely decreased systolic function (LVEF = 20%). There is diffuse hypokinesis. No late gadolinium enhancement was seen. 2. Normal right ventricular size, thickness and borderline systolic function (RVEF =44%, normal > 45%) with no regional wall motion abnormalities. 3. Mildly to moderately dilated left atrium. Normal size of the right atrium. 4. Mild to moderate mitral regurgitation, mild tricuspid regurgitation. Mild aortic regurgitation. Collectively, these findings are consistent with advanced stage of hypertensive cardiomyopathy. There is no evidence of ischemic, infiltrative or inflammatory cardiomyopathy. Tobias Alexander Electronically Signed: By: Tobias Alexander On: 06/14/2016 18:10    Microbiology: Recent Results (from the past 240 hour(s))  Culture, blood (routine x 2)     Status: None (Preliminary result)   Collection Time: 06/12/16  7:11 AM  Result Value Ref Range Status   Specimen Description BLOOD RIGHT ARM  Final   Special Requests IN PEDIATRIC BOTTLE 3CC  Final   Culture   Final    NO GROWTH 4 DAYS Performed at [facility]    Report Status PENDING  Incomplete  Culture, blood (routine x 2)     Status: None (Preliminary result)   Collection Time: 06/12/16  7:11 AM  Result Value Ref Range Status   Specimen Description BLOOD RIGHT HAND  Final   Special Requests IN PEDIATRIC BOTTLE 3CC  Final   Culture   Final    NO GROWTH 4 DAYS Performed at [facility]    Report Status  PENDING  Incomplete  MRSA PCR Screening     Status: None   Collection Time: 06/12/16  7:43 AM  Result Value Ref Range Status   MRSA by PCR NEGATIVE NEGATIVE Final    Comment:        The GeneXpert MRSA Assay (FDA approved for NASAL specimens only), is one component of a comprehensive MRSA colonization surveillance program. It is not intended to diagnose MRSA infection nor to guide or monitor treatment for MRSA infections.   Urine culture     Status: Abnormal   Collection Time: 06/14/16 12:01 AM  Result Value Ref Range Status   Specimen Description URINE, RANDOM  Final   Special Requests NONE  Final   Culture MULTIPLE SPECIES PRESENT, SUGGEST RECOLLECTION (A)  Final   Report Status 06/15/2016 FINAL  Final     Labs: Basic Metabolic Panel:  Recent Labs Lab 06/12/16 0711  06/13/16 0324 06/14/16 0124 06/14/16 0512 06/15/16 0201 06/16/16 0304 06/17/16 0405  NA 138  < > 136 135 135 134* 135 135  K 3.6  < >  3.0* 3.4* 3.4* 3.7 3.7 4.2  CL 104  < > 101 100* 99* 103 103 104  CO2 25  < > 27 27 26 25 25 24   GLUCOSE 123*  < > 116* 126* 115* 96 81 107*  BUN 19  < > 21* 21* 20 27* 18 16  CREATININE 1.31*  < > 1.16* 1.24* 1.18* 1.30* 1.16* 1.18*  CALCIUM 8.5*  < > 8.3* 8.7* 8.8* 8.7* 8.6* 9.1  MG 1.5*  --  1.6*  --   --   --  2.1  --   PHOS 3.1  --  3.7  --   --   --   --   --   < > = values in this interval not displayed. Liver Function Tests:  Recent Labs Lab 06/12/16 0100 06/12/16 0711  AST 25 20  ALT 18 15  ALKPHOS 89 68  BILITOT 0.6 0.6  PROT 9.1* 7.2  ALBUMIN 4.3 3.5   No results for input(s): LIPASE, AMYLASE in the last 168 hours. No results for input(s): AMMONIA in the last 168 hours. CBC:  Recent Labs Lab 06/12/16 0100  06/12/16 0711 06/13/16 0324 06/14/16 0512 06/15/16 0201 06/16/16 0304  WBC 22.5*  --  15.6* 11.2* 9.3 9.4 7.8  NEUTROABS 9.9*  --  13.1*  --  5.0  --   --   HGB 17.2*  < > 15.1* 14.1 14.9 14.0 13.4  HCT 52.7*  < > 45.3 43.4 46.7* 44.1  42.7  MCV 92.9  --  91.0 91.4 93.6 94.0 94.3  PLT 405*  --  296 292 308 283 282  < > = values in this interval not displayed. Cardiac Enzymes:  Recent Labs Lab 06/12/16 0711 06/12/16 1123 06/12/16 1738  TROPONINI 0.37* 0.33* 0.28*   BNP: BNP (last 3 results)  Recent Labs  06/12/16 0041 06/12/16 0711  BNP 1,340.0* 1,413.4*    ProBNP (last 3 results) No results for input(s): PROBNP in the last 8760 hours.  CBG: No results for input(s): GLUCAP in the last 168 hours.     SignedZannie Cove MD.  [facility] 06/17/2016, 1:04 PM

## 2016-06-17 NOTE — Progress Notes (Signed)
CARDIAC REHAB PHASE I   PRE:  Rate/Rhythm: 88  BP:  Sitting: 150/110, 166/75     SaO2: 100  MODE:  Ambulation: 800 ft   POST:  Rate/Rhythm: 93  BP:  Sitting: 182/62     SaO2: 100  10:55am-11:22am Patient ambulated at a steady pace independently. No complaints of pain or shortness of breath. Blood pressure meds administered after walk. Talked about daily weighs, when to call the doctor, low sodium diet, and exercising at home. Patient back in bed with call bell in reach.   Mandy Serrano Tannah Dreyfuss, MS 06/17/2016 11:20 AM

## 2016-06-17 NOTE — Progress Notes (Signed)
Advanced Heart Failure Rounding Note   Subjective:    Admitted with increased dyspnea and HTN crisis. New acute systolic heart identified on ECHO. LVEF ~25. CMRI no infiltrative/inflamatory process and EF ~25%.   Started on amio 8/24 for frequent PVCs (~16%)  Feels mch better. No SOB. Very anxious to go home. Renal artery u/s no RAS. SBP 150-180 this am.    Objective:   Weight Range:  Vital Signs:   Temp:  [97.9 F (36.6 C)-98.4 F (36.9 C)] 98.2 F (36.8 C) (08/26 0738) Pulse Rate:  [81-109] 82 (08/26 0738) Resp:  [18] 18 (08/26 0738) BP: (121-172)/(83-117) 172/108 (08/26 0738) SpO2:  [100 %] 100 % (08/26 0738) Weight:  [102.1 kg (225 lb)] 102.1 kg (225 lb) (08/26 0515) Last BM Date: 06/15/16  Weight change: Filed Weights   06/15/16 1524 06/16/16 0524 06/17/16 0515  Weight: 102.4 kg (225 lb 11.2 oz) 101.8 kg (224 lb 8 oz) 102.1 kg (225 lb)    Intake/Output:   Intake/Output Summary (Last 24 hours) at 06/17/16 1205 Last data filed at 06/17/16 0600  Gross per 24 hour  Intake              720 ml  Output              770 ml  Net              -50 ml     Physical Exam: General:  Well appearing. No resp difficulty. In chair  HEENT: normal Neck: supple. JVP difficult to assess due body habitus. Does not appear elevated.  . Carotids 2+ bilat; no bruits. No lymphadenopathy or thryomegaly appreciated. Cor: PMI nondisplaced. Regular rate & rhythm. No rubs, gallops. 2/6 TR. Lungs: clear Abdomen: soft, nontender, nondistended. No hepatosplenomegaly. No bruits or masses. Good bowel sounds. Extremities: no cyanosis, clubbing, rash, edema Neuro: alert & orientedx3, cranial nerves grossly intact. moves all 4 extremities w/o difficulty. Affect pleasant  Telemetry: NSR  minimal PVCs  Labs: Basic Metabolic Panel:  Recent Labs Lab 06/12/16 0711  06/13/16 0324 06/14/16 0124 06/14/16 0512 06/15/16 0201 06/16/16 0304 06/17/16 0405  NA 138  < > 136 135 135 134* 135 135    K 3.6  < > 3.0* 3.4* 3.4* 3.7 3.7 4.2  CL 104  < > 101 100* 99* 103 103 104  CO2 25  < > 27 27 26 25 25 24   GLUCOSE 123*  < > 116* 126* 115* 96 81 107*  BUN 19  < > 21* 21* 20 27* 18 16  CREATININE 1.31*  < > 1.16* 1.24* 1.18* 1.30* 1.16* 1.18*  CALCIUM 8.5*  < > 8.3* 8.7* 8.8* 8.7* 8.6* 9.1  MG 1.5*  --  1.6*  --   --   --  2.1  --   PHOS 3.1  --  3.7  --   --   --   --   --   < > = values in this interval not displayed.  Liver Function Tests:  Recent Labs Lab 06/12/16 0100 06/12/16 0711  AST 25 20  ALT 18 15  ALKPHOS 89 68  BILITOT 0.6 0.6  PROT 9.1* 7.2  ALBUMIN 4.3 3.5   No results for input(s): LIPASE, AMYLASE in the last 168 hours. No results for input(s): AMMONIA in the last 168 hours.  CBC:  Recent Labs Lab 06/12/16 0100  06/12/16 0711 06/13/16 0324 06/14/16 0512 06/15/16 0201 06/16/16 0304  WBC 22.5*  --  15.6*  11.2* 9.3 9.4 7.8  NEUTROABS 9.9*  --  13.1*  --  5.0  --   --   HGB 17.2*  < > 15.1* 14.1 14.9 14.0 13.4  HCT 52.7*  < > 45.3 43.4 46.7* 44.1 42.7  MCV 92.9  --  91.0 91.4 93.6 94.0 94.3  PLT 405*  --  296 292 308 283 282  < > = values in this interval not displayed.  Cardiac Enzymes:  Recent Labs Lab 06/12/16 0711 06/12/16 1123 06/12/16 1738  TROPONINI 0.37* 0.33* 0.28*    BNP: BNP (last 3 results)  Recent Labs  06/12/16 0041 06/12/16 0711  BNP 1,340.0* 1,413.4*    ProBNP (last 3 results) No results for input(s): PROBNP in the last 8760 hours.    Other results:  Imaging: [place] Renal  Result Date: 06/16/2016 CLINICAL DATA:  Renal failure, hypertensive crisis on June 12, 2016, systolic heart failure. EXAM: RENAL / URINARY TRACT ULTRASOUND COMPLETE COMPARISON:  None in PACs FINDINGS: Right Kidney: Length: 10.7 cm. The renal cortical echotexture is increased and is equal to or slightly greater than that of the liver. There is no focal mass nor hydronephrosis. Left Kidney: Length: 11.0 cm. The renal cortical echotexture is  mildly increased similar to that on the right. There is no focal mass nor hydronephrosis. Bladder: The partially distended urinary bladder is normal. Ureteral jets are observed bilaterally. IMPRESSION: Increased renal cortical echotexture consistent with medical renal disease. There is no hydronephrosis. The urinary bladder is unremarkable. Electronically Signed   By: David  SwazilandJordan M.D.   On: 06/16/2016 09:33     Medications:     Scheduled Medications: . amiodarone  200 mg Oral BID  . aspirin EC  81 mg Oral Daily  . carvedilol  3.125 mg Oral BID WC  . enoxaparin (LOVENOX) injection  40 mg Subcutaneous Q24H  . losartan  25 mg Oral Daily  . sodium chloride flush  3 mL Intravenous Q12H  . spironolactone  25 mg Oral Daily    Infusions:    PRN Medications: sodium chloride, sodium chloride, acetaminophen, hydrALAZINE, nitroGLYCERIN, ondansetron (ZOFRAN) IV, sodium chloride flush   Assessment:  1.Acute Systolic Heart Failure  2. HTN Crisis 3. Frequent PVCs ~16% 4. Hypomangesium 5. Low Reading Level    Plan/Discussion:     Newly diagnosed acute systolic heart failure with EF ~25. No infiltrative/inflammatory/ischemic process on CMRI. Suspect long standing hypertension +/- PVC induced.   Volume status stable. But BP back up dramatically now 176/101  Will increase losartan to 100 daily (is not a citizen so cant get help obtaining Entresto) Increase carvedilol 6.25 bid spiro 25 daily  Continue amio 200 mg twice daily to suppress PVCs. TSH ok. Mag improved.   Can stop ASA  Plan to follow closely in the HF clinic. (was [place] 5 anti[place]N medicines in [place] Will get wrist BP cuff to measure at home.   D/w Dr. Jomarie LongsJoseph.    Length of Stay: 5   Bensimhon, Daniel MD 06/17/2016, 12:05 PM  Advanced Heart Failure Team Pager [phone] (M-F; 7a - 4p)  Please contact [facility] for night-coverage after hours (4p -7a ) and weekends on [email or web address]

## 2016-06-22 ENCOUNTER — Ambulatory Visit (HOSPITAL_COMMUNITY)
Admit: 2016-06-22 | Discharge: 2016-06-22 | Disposition: A | Discharge status: Home / Self Care | Payer: Self-pay | Source: Ambulatory Visit | Attending: Cardiology | Admitting: Cardiology

## 2016-06-22 VITALS — BP 184/106 | HR 90 | Wt 223.6 lb

## 2016-06-22 DIAGNOSIS — I10 Essential (primary) hypertension: Secondary | ICD-10-CM

## 2016-06-22 DIAGNOSIS — I110 Hypertensive heart disease with heart failure: Secondary | ICD-10-CM | POA: Insufficient documentation

## 2016-06-22 DIAGNOSIS — Z79899 Other long term (current) drug therapy: Secondary | ICD-10-CM | POA: Insufficient documentation

## 2016-06-22 DIAGNOSIS — I493 Ventricular premature depolarization: Secondary | ICD-10-CM | POA: Insufficient documentation

## 2016-06-22 DIAGNOSIS — R51 Headache: Secondary | ICD-10-CM | POA: Insufficient documentation

## 2016-06-22 DIAGNOSIS — I428 Other cardiomyopathies: Secondary | ICD-10-CM | POA: Insufficient documentation

## 2016-06-22 DIAGNOSIS — I5022 Chronic systolic (congestive) heart failure: Secondary | ICD-10-CM | POA: Insufficient documentation

## 2016-06-22 DIAGNOSIS — I5021 Acute systolic (congestive) heart failure: Secondary | ICD-10-CM

## 2016-06-22 DIAGNOSIS — Z7982 Long term (current) use of aspirin: Secondary | ICD-10-CM | POA: Insufficient documentation

## 2016-06-22 LAB — BASIC METABOLIC PANEL
ANION GAP: 8 (ref 5–15)
BUN: 12 mg/dL (ref 6–20)
CHLORIDE: 103 mmol/L (ref 101–111)
CO2: 24 mmol/L (ref 22–32)
Calcium: 9.5 mg/dL (ref 8.9–10.3)
Creatinine, Ser: 1.15 mg/dL — ABNORMAL HIGH (ref 0.44–1.00)
GFR calc non Af Amer: 58 mL/min — ABNORMAL LOW (ref >60)
Glucose, Bld: 96 mg/dL (ref 65–99)
Potassium: 5.1 mmol/L (ref 3.5–5.1)
Sodium: 135 mmol/L (ref 135–145)

## 2016-06-22 MED ORDER — CARVEDILOL 6.25 MG PO TABS: 6.2500 mg | ORAL_TABLET | Freq: Two times a day (BID) | ORAL | 3 refills | Status: DC

## 2016-06-22 MED ORDER — HYDRALAZINE HCL 25 MG PO TABS
25.0000 mg | ORAL_TABLET | Freq: Once | ORAL | Status: AC
  Administered 2016-06-22: 25 mg via ORAL
  Filled 2016-06-22: qty 1

## 2016-06-22 MED ORDER — LOSARTAN POTASSIUM 100 MG PO TABS: 50.0000 mg | ORAL_TABLET | ORAL | 3 refills | Status: DC

## 2016-06-22 MED ORDER — HYDRALAZINE HCL 25 MG PO TABS: 25.0000 mg | ORAL_TABLET | Freq: Three times a day (TID) | ORAL | 3 refills | Status: DC

## 2016-06-22 MED FILL — hydrALAZINE HCL 25 MG TABS: 25 | 34 days supply | Qty: 102 | Fill #0

## 2016-06-22 MED FILL — LOSARTAN POTASSIUM 50 MG TA: 50 | 34 days supply | Qty: 102 | Fill #0

## 2016-06-22 MED FILL — SPIRONOLACTONE 25 MG TABLET: 25 | 34 days supply | Qty: 34 | Fill #0

## 2016-06-22 MED FILL — CARVEDILOL 6.25 MG TABLET: 6.25 | 34 days supply | Qty: 68 | Fill #0

## 2016-06-22 NOTE — Patient Instructions (Signed)
INCREASE coreg  To 6.25 mg one tab twice a day CHANGE Losartan to 100 mg in the AM and 50 mg in the PM START Hydralazine 25 mg, one tab three times per day  Labs today and again in one week  Your physician recommends that you schedule a follow-up appointment in: 2 weeks with CHF pharmacist and 4 weeks with Tonye Becket [facility]

## 2016-06-22 NOTE — Progress Notes (Addendum)
Advanced Heart Failure Medication Review by a Pharmacist  Does the patient  feel that his/her medications are working for him/her?  yes  Has the patient been experiencing any side effects to the medications prescribed?  no  Does the patient measure his/her own blood pressure or blood glucose at home?  no   Does the patient have any problems obtaining medications due to transportation or finances?   Yes - working on Medicaid/citizenship  Understanding of regimen: good Understanding of indications: good Potential of compliance: excellent Patient understands to avoid NSAIDs. Patient understands to avoid decongestants.  Issues to address at subsequent visits: Medicaid/citizenship status   Pharmacist comments:  Mandy Serrano is a pleasant 40 yo F presenting with her medication bottles. Patient was recently discharged from hospital and all medications have been reviewed. She reports excellent compliance with her regimen. She does state that she has been having right sided headaches/neck pain on a daily basis. Will send her HF medications to our Outpatient pharmacy so that she can get her medications through our HF fund until she is able to get insurance. She did not have any other medication-related questions or concerns for me at this time.   Tyler Deis. [name], PharmD, BCPS, CPP Clinical Pharmacist Pager: [phone] Phone: [phone] 06/22/2016 11:07 AM    Time with patient: 10 minutes Preparation and documentation time: 2 minutes Total time: 12 minutes

## 2016-06-22 NOTE — Progress Notes (Signed)
PCP: None  Primary Cardiologist: Dr Gala RomneyBensimhon   HPI [name] is a 40 year old with history of HTN ( was on 5 anti[place] medicines in [place]  newly diagnosed systolic heart failure 05/2016, headaches, and frequent PVCs. [place] been in [place]. No insurance.   Admitted with increased dyspnea and HTN crisis on 06/12/16. She had not been taking any medications over the last last year. New acute systolic heart identified on ECHO. LVEF ~25. CMRI no infiltrative/inflamatory process and EF ~25%. Had high PVC burden so she was started on amio. TSH was normal. Unable to obtain Entresto because she is not [place] citizen. Discharge weight 225 pounds.   Today she presents for post hospital follow up. Complaining of headache on the right side of her head. Describes pain 8/10. Yesterday she had some nausea. Not using contraceptives. Denies SOB/PND/Orthopnea. Weight at home 218-223 pounds  SBP at home 120-130s. Taking all medications. Lives with her husband. Can read some [place]. She does not work.   ROS: All systems negative except as listed in HPI, PMH and Problem List.  SH:  Social History   Social History  . Marital status: Married    Spouse name: N/A  . Number of children: N/A  . Years of education: N/A   Occupational History  . Not on file.   Social History Main Topics  . Smoking status: Never Smoker  . Smokeless tobacco: Never Used  . Alcohol use Not on file  . Drug use: Unknown  . Sexual activity: Not on file   Other Topics Concern  . Not on file   Social History Narrative  . No narrative on file    FH: No family history on file.  No past medical history on file.  Current Outpatient Prescriptions  Medication Sig Dispense Refill  . acetaminophen (TYLENOL) 500 MG tablet Take 500 mg by mouth every 6 (six) hours as needed for mild pain or headache.    Marland Kitchen. amiodarone (PACERONE) 200 MG tablet Take 1 tablet (200 mg total) by mouth 2 (two) times daily. 60 tablet 0  . aspirin EC 81 MG EC  tablet Take 1 tablet (81 mg total) by mouth daily.    . carvedilol (COREG) 6.25 MG tablet Take 0.5 tablets (3.125 mg total) by mouth 2 (two) times daily with a meal. 60 tablet 0  . losartan (COZAAR) 100 MG tablet Take 1 tablet (100 mg total) by mouth daily. 30 tablet 0  . spironolactone (ALDACTONE) 25 MG tablet Take 1 tablet (25 mg total) by mouth daily. 30 tablet 0   No current facility-administered medications for this encounter.     Vitals:   06/22/16 1052  BP: (!) 184/106  Pulse: 90  SpO2: 100%  Weight: 223 lb 9.6 oz (101.4 kg)   EKG: NSR with PVCs 75 bpm   PHYSICAL EXAM: General:  Well appearing. No resp difficulty. Husband present  HEENT: normal Neck: supple. JVP flat. Carotids 2+ bilaterally; no bruits. No lymphadenopathy or thryomegaly appreciated. Cor: PMI normal. Irregular. Regular rate & rhythm. No rubs, gallops or murmurs. Lungs: clear Abdomen: obese, soft, nontender, nondistended. No hepatosplenomegaly. No bruits or masses. Good bowel sounds. Extremities: no cyanosis, clubbing, rash, edema Neuro: alert & orientedx3, cranial nerves grossly intact. Moves all 4 extremities w/o difficulty. Affect pleasant.   ECG: NSR PVC 75 bpm.    ASSESSMENT & PLAN: 1. Chronic Systolic Heart Failure- NICM- ECHO 05/2016  LVEF ~25. CMRI no infiltrative/inflamatory process and EF ~25%. Plan to  repeat ECHO after HF meds optimized.  NYHA II. Volume status stable. Continue 25 mg spiro daily. Increase carvedilol to 6.25 mg twice a day.  Continue losartan 100 mg daily and add 50 mg losartan in the pm. BMET today and in 1 week.  Continue 25 mg spiro daily.  Add hydralazine 25 mg three times a day. No imdur with headache.  Discussed the importance of preventing pregnancy. Discussed using condoms to prevent pregnancy.  Today we will provide medications from HF fund. Reinforced daily weights, low salty food choices, and limiting fluid intake to < 2 liters daily.  2. HTN- Elevated today. As above  increased bb, arb and added hydralazine. Given 25 mg of hydralazine in the clinic.  3. Headache- ? Related to HTN. Discussed with Dr Gala Romney. She was instructed to go to ED if headache worsens.  4. PVCs-Continue amio 200 mg twice daily. Had eye exam yesterday. Needs TSH check next visit.   Follow up with [facility] today for PCP. Follow up in 1 week Pharmacy and 2 weeks with [facility]. Today she was seen by HFSW and Paramedinice.   Kiley Torrence NP-C  12:02 PM

## 2016-06-22 NOTE — Progress Notes (Signed)
CSW met with patient and her husband. Patient reports she is an immigrant and has no insurance. Her husband states that they came from [place] because of patient's health and thought she could get better care her [place]. Patient is requesting assistance with locating a primary care. CSW provided patient with information for [facility] [phone] which will see her this afternoon to discuss options for primary care needs. Patient and husband verbalizes understanding and will follow up this afternoon at [facility]. CSW available as needed. Raquel Sarna, LCSW [phone]

## 2016-06-22 NOTE — Addendum Note (Signed)
Encounter addended by: Marcy Siren, LCSW on: 06/22/2016  2:33 PM<BR>    Actions taken: Sign clinical note

## 2016-06-27 ENCOUNTER — Telehealth (HOSPITAL_COMMUNITY): Payer: Self-pay | Admitting: *Deleted

## 2016-06-27 MED ORDER — HYDRALAZINE HCL 25 MG PO TABS: 50.0000 mg | ORAL_TABLET | Freq: Three times a day (TID) | ORAL | 3 refills | Status: DC

## 2016-06-27 NOTE — Telephone Encounter (Signed)
[name] called this AM to report pt's BP is still elevated 180s.  She had pt check BP with her wrist machine and then [name] checked it and pt's home cuff was a quit a bit lower than the actual reading.  Pt is still c/o headache, which is some better with tylenol.  Per Florentina Addison pt did make med changes as ordered 8/31, increased coreg to 6.25 and started hydralazine 25 mg TID.  Discussed w/Andy Lanna Poche, PA he advised pt to increase Hydralazine to 50 mg TID.  I called and spoke w/pt, she is aware and agreeable and will increase med.  She is sch for BP check on Thurs 9/7 and will keep that appt.

## 2016-06-29 ENCOUNTER — Ambulatory Visit (HOSPITAL_COMMUNITY)
Admission: RE | Admit: 2016-06-29 | Discharge: 2016-06-29 | Disposition: A | Discharge status: Home / Self Care | Payer: Self-pay | Source: Ambulatory Visit | Attending: Cardiology | Admitting: Cardiology

## 2016-06-29 VITALS — BP 200/102 | HR 78 | Wt 220.2 lb

## 2016-06-29 DIAGNOSIS — I5023 Acute on chronic systolic (congestive) heart failure: Secondary | ICD-10-CM

## 2016-06-29 DIAGNOSIS — I5021 Acute systolic (congestive) heart failure: Secondary | ICD-10-CM | POA: Insufficient documentation

## 2016-06-29 LAB — BASIC METABOLIC PANEL
Anion gap: 6 (ref 5–15)
BUN: 17 mg/dL (ref 6–20)
CHLORIDE: 106 mmol/L (ref 101–111)
CO2: 26 mmol/L (ref 22–32)
Calcium: 9.4 mg/dL (ref 8.9–10.3)
Creatinine, Ser: 1.29 mg/dL — ABNORMAL HIGH (ref 0.44–1.00)
GFR calc non Af Amer: 50 mL/min — ABNORMAL LOW (ref >60)
GFR, EST AFRICAN AMERICAN: 58 mL/min — AB (ref >60)
Glucose, Bld: 103 mg/dL — ABNORMAL HIGH (ref 65–99)
POTASSIUM: 5.1 mmol/L (ref 3.5–5.1)
SODIUM: 138 mmol/L (ref 135–145)

## 2016-06-29 MED ORDER — HYDRALAZINE HCL 100 MG PO TABS: 100.0000 mg | ORAL_TABLET | Freq: Three times a day (TID) | ORAL | 3 refills | Status: DC

## 2016-06-29 MED FILL — hydrALAZINE HCL 100 MG TABS: 100 | 30 days supply | Qty: 90 | Fill #0

## 2016-06-29 NOTE — Patient Instructions (Signed)
Increase Hydralazine to 100 mg Three times a day WE HAVE SENT YOU IN A NEW PRESCRIPTION FOR 100 MG TABLETS  Lab today  Your physician recommends that you schedule a follow-up appointment in: as scheduled

## 2016-06-29 NOTE — Progress Notes (Signed)
Discussed pt's BP and meds with Tonye Becket, NP she would like pt to increase Hydralazine to 100 mg Three times a day and get a bmet today.  She would like Florentina Addison, paramedic to see pt on Mon 9/11.

## 2016-06-29 NOTE — Addendum Note (Signed)
Encounter addended by: Noralee Space, RN on: 06/29/2016 11:16 AM<BR>    Actions taken: Pharmacy for encounter modified, Order Entry activity accessed

## 2016-07-04 ENCOUNTER — Other Ambulatory Visit (HOSPITAL_COMMUNITY): Payer: Self-pay | Admitting: Adult Health

## 2016-07-04 ENCOUNTER — Telehealth (HOSPITAL_COMMUNITY): Payer: Self-pay | Admitting: Adult Health

## 2016-07-04 MED ORDER — AMLODIPINE BESYLATE 5 MG PO TABS
5.0000 mg | ORAL_TABLET | Freq: Every day | ORAL | 6 refills | Status: DC
Start: 2016-07-04 — End: 2016-07-10

## 2016-07-04 MED FILL — AMLODIPINE BESYLATE 5 MG TA: 5 | 30 days supply | Qty: 30 | Fill #0

## 2016-07-04 NOTE — Telephone Encounter (Signed)
  Received call from [name]  SBP continues to run high from 180-200.   Add 5 mg amlodipine daily  Follow up next week for BP check   Jiovany Scheffel 12:38 PM

## 2016-07-10 ENCOUNTER — Ambulatory Visit (HOSPITAL_COMMUNITY)
Admission: RE | Admit: 2016-07-10 | Discharge: 2016-07-10 | Disposition: A | Discharge status: Home / Self Care | Payer: Self-pay | Source: Ambulatory Visit | Attending: Cardiology | Admitting: Cardiology

## 2016-07-10 VITALS — BP 162/90 | HR 65 | Wt 221.4 lb

## 2016-07-10 DIAGNOSIS — I493 Ventricular premature depolarization: Secondary | ICD-10-CM | POA: Insufficient documentation

## 2016-07-10 DIAGNOSIS — I429 Cardiomyopathy, unspecified: Secondary | ICD-10-CM | POA: Insufficient documentation

## 2016-07-10 DIAGNOSIS — I10 Essential (primary) hypertension: Secondary | ICD-10-CM | POA: Insufficient documentation

## 2016-07-10 DIAGNOSIS — R51 Headache: Secondary | ICD-10-CM | POA: Insufficient documentation

## 2016-07-10 DIAGNOSIS — I5022 Chronic systolic (congestive) heart failure: Secondary | ICD-10-CM | POA: Insufficient documentation

## 2016-07-10 MED ORDER — SPIRONOLACTONE 25 MG PO TABS: 25.0000 mg | ORAL_TABLET | Freq: Every day | ORAL | 5 refills | Status: DC

## 2016-07-10 MED ORDER — AMIODARONE HCL 200 MG PO TABS: 200.0000 mg | ORAL_TABLET | Freq: Two times a day (BID) | ORAL | 5 refills | Status: DC

## 2016-07-10 MED ORDER — AMLODIPINE BESYLATE 5 MG PO TABS: 7.5000 mg | ORAL_TABLET | Freq: Every day | ORAL | 6 refills | Status: DC

## 2016-07-10 MED ORDER — LOSARTAN POTASSIUM 100 MG PO TABS: 50.0000 mg | ORAL_TABLET | ORAL | 5 refills | Status: DC

## 2016-07-10 MED FILL — LOSARTAN POTASSIUM 100 MG T: 100 | 30 days supply | Qty: 45 | Fill #0

## 2016-07-10 MED FILL — AMIODARONE HCL 200 MG TAB: 200 | 30 days supply | Qty: 60 | Fill #0

## 2016-07-10 NOTE — Patient Instructions (Signed)
Please INCREASE amlodipine to 7.5 mg (1 and 1/2 tablets) every evening.   Keep your appointment with Tonye Becket, NP-C on 9/27.

## 2016-07-10 NOTE — Progress Notes (Signed)
HPI:  Mandy Serrano is a 40 year old with history of HTN ( was on 5 anti-HTN medicines in [place]),  newly diagnosed systolic heart failure 05/2016 (EF 25-30%), headaches, and frequent PVCs. Has been in [place] for 1 year. No insurance.   Admitted with increased dyspnea and HTN crisis on 06/12/16. She had not been taking any medications over the last last year. New acute systolic heart identified on ECHO. CMRI no infiltrative/inflamatory process and EF ~25%. Had high PVC burden so she was started on amio. TSH was normal. Unable to obtain Entresto because she is not [place] citizen. Discharge weight 225 pounds.   Today she presents for pharmacist-led HF medication titration. At last HF clinic visit on 8/31, carvedilol was increased to 6.25 mg BID and hydralazine 25 mg TID was added. Since then, she has required increases in hydralazine up to 100 mg TID and addition of amlodipine 5 mg daily for elevated SBP (180-200s). She states that she is no longer having headaches but is still having shoulder/neck pressure after she takes her medications. Taking all medications. Lives with her husband. Can read some [place]. She does not work.     . Shortness of breath/dyspnea on exertion? no  . Orthopnea/PND? no . Edema? no . Lightheadedness/dizziness? no . Daily weights at home? Yes - ~220 lb  . Blood pressure/heart rate monitoring at home? Yes - 138/81 mmHg . Following low-sodium/fluid-restricted diet? Yes - not adding salt and staying < 2L fluid daily   HF Medications: Carvedilol 6.25 mg PO BID Hydralazine 100 mg PO TID Losartan 100 mg QAM and 50 mg QPM Spironolactone 25 mg PO daily  Precautions: Isosorbide mononitrate - having headaches daily  Has the patient been experiencing any side effects to the medications prescribed?  No  Does the patient have any problems obtaining medications due to transportation or finances?   Yes - not a [place] citizen, using HF fund  Understanding of regimen: good Understanding of  indications: good Potential of compliance: good Patient understands to avoid NSAIDs. Patient understands to avoid decongestants.    Pertinent Lab Values: . 06/29/16: Serum creatinine 1.29 (BL ~1.1), BUN 17, Potassium 5.1, Sodium 138,  . 06/16/16: Magnesium 2.1  Vital Signs: . Weight: 221 lb (dry weight: 220 lb) . Blood pressure: 162/90 mmHg  . Heart rate: 65 bpm    Assessment: 1. Chronicsystolic CHF (EF 88-89%), due to NICM. NYHA class IIsymptoms.  - Volume status stable.   - Continue carvedilol 6.25 mg BID, hydralazine 100 mg TID, losartan 100 mg qAM/50 mg qPM, and spironolactone 25 mg daily  - Since headaches are improving, could consider addition of isosorbide at next visit - Basic disease state pathophysiology, medication indication, mechanism and side effects reviewed at length with patient and he verbalized understanding 2. HTN  - Remains uncontrolled but improved  - Increase amlodipine to 7.5 mg daily   - Continue other medications as above 3. PVCs  - Continue amiodarone 200 mg BID  - Had eye exam in August  - TSH and LFTs wnl in August  Plan: 1) Medication changes: Based on clinical presentation, vital signs and recent labs will increase amlodipine to 7.5 mg daily  2) Labs: PRN  3) Follow-up: Tonye Becket, NP-C on 9/27   Erika K. [name], PharmD, BCPS, CPP Clinical Pharmacist Pager: [phone] Phone: [phone] 07/10/2016 1:48 PM   BP still high. Agree with increasing amlodipine.   Woodroe Vogan,MD 11:13 PM

## 2016-07-12 ENCOUNTER — Other Ambulatory Visit (HOSPITAL_COMMUNITY): Payer: Self-pay | Admitting: *Deleted

## 2016-07-12 MED ORDER — SPIRONOLACTONE 25 MG PO TABS: 25.0000 mg | ORAL_TABLET | Freq: Every day | ORAL | 2 refills | Status: DC

## 2016-07-12 MED ORDER — CARVEDILOL 6.25 MG PO TABS: 6.2500 mg | ORAL_TABLET | Freq: Two times a day (BID) | ORAL | 3 refills | Status: DC

## 2016-07-12 MED FILL — SPIRONOLACTONE 25 MG TABLET: 25 | 30 days supply | Qty: 30 | Fill #0

## 2016-07-12 MED FILL — CARVEDILOL 6.25 MG TABLET: 6.25 | 30 days supply | Qty: 60 | Fill #0

## 2016-07-19 ENCOUNTER — Ambulatory Visit (HOSPITAL_COMMUNITY)
Admission: RE | Admit: 2016-07-19 | Discharge: 2016-07-19 | Disposition: A | Discharge status: Home / Self Care | Payer: Self-pay | Source: Ambulatory Visit | Attending: Cardiology | Admitting: Cardiology

## 2016-07-19 VITALS — BP 148/92 | HR 77 | Wt 220.2 lb

## 2016-07-19 DIAGNOSIS — R51 Headache: Secondary | ICD-10-CM | POA: Insufficient documentation

## 2016-07-19 DIAGNOSIS — I5022 Chronic systolic (congestive) heart failure: Secondary | ICD-10-CM | POA: Insufficient documentation

## 2016-07-19 DIAGNOSIS — I10 Essential (primary) hypertension: Secondary | ICD-10-CM | POA: Insufficient documentation

## 2016-07-19 DIAGNOSIS — Z7982 Long term (current) use of aspirin: Secondary | ICD-10-CM | POA: Insufficient documentation

## 2016-07-19 DIAGNOSIS — I493 Ventricular premature depolarization: Secondary | ICD-10-CM | POA: Insufficient documentation

## 2016-07-19 MED ORDER — AMLODIPINE BESYLATE 10 MG PO TABS: 10.0000 mg | ORAL_TABLET | Freq: Every day | ORAL | 6 refills | Status: DC

## 2016-07-19 MED FILL — AMLODIPINE BESYLATE 10 MG T: 10 | 30 days supply | Qty: 30 | Fill #0

## 2016-07-19 NOTE — Progress Notes (Signed)
PCP: None  Primary Cardiologist: Dr Gala RomneyBensimhon   HPI [name] is a 40 year old with history of HTN ( was on 5 anti[place] medicines in [place]),  newly diagnosed systolic heart failure 05/2016, headaches, and frequent PVCs. [place] been in [place]. No insurance.   Admitted with increased dyspnea and HTN crisis on 06/12/16. She had not been taking any medications over the last last year. New acute systolic heart identified on ECHO. LVEF ~25. CMRI no infiltrative/inflamatory process and EF ~25%. Had high PVC burden so she was started on amio. TSH was normal. Unable to obtain Entresto because she is not [place] citizen. Discharge weight 225 pounds.   Today she presents for HF follow up. Overall feeling much better. Denies SOB/PND/Orthopnea. Able to walk 20 minutes on the treadmill twice a week. Headache resolved. Weight at home 220 pounds. SBP at home 130s but this is 10-15 points lower than manual cuff. Followed by Paramedicine. Taking all medications.   Labs 06/22/2016: K 5.1 Creatinine 1.15    ROS: All systems negative except as listed in HPI, PMH and Problem List.  SH:  Social History   Social History  . Marital status: Married    Spouse name: N/A  . Number of children: N/A  . Years of education: N/A   Occupational History  . Not on file.   Social History Main Topics  . Smoking status: Never Smoker  . Smokeless tobacco: Never Used  . Alcohol use Not on file  . Drug use: Unknown  . Sexual activity: Not on file   Other Topics Concern  . Not on file   Social History Narrative  . No narrative on file    FH: No family history on file.  No past medical history on file.  Current Outpatient Prescriptions  Medication Sig Dispense Refill  . acetaminophen (TYLENOL) 500 MG tablet Take 500 mg by mouth every 6 (six) hours as needed for mild pain or headache.    Marland Kitchen. amiodarone (PACERONE) 200 MG tablet Take 1 tablet (200 mg total) by mouth 2 (two) times daily. 60 tablet 5  . amLODipine  (NORVASC) 5 MG tablet Take 1.5 tablets (7.5 mg total) by mouth daily. 45 tablet 6  . aspirin EC 81 MG EC tablet Take 1 tablet (81 mg total) by mouth daily.    . carvedilol (COREG) 6.25 MG tablet Take 1 tablet (6.25 mg total) by mouth 2 (two) times daily with a meal. 60 tablet 3  . hydrALAZINE (APRESOLINE) 100 MG tablet Take 1 tablet (100 mg total) by mouth 3 (three) times daily. 90 tablet 3  . losartan (COZAAR) 100 MG tablet Take 0.5-1 tablets (50-100 mg total) by mouth as directed. Take 100mg   (1 tab) in the AM and 50mg  (one half tab) in the PM 45 tablet 5  . spironolactone (ALDACTONE) 25 MG tablet Take 1 tablet (25 mg total) by mouth daily. 30 tablet 2   No current facility-administered medications for this encounter.     Vitals:   07/19/16 0952  BP: (!) 148/92  Pulse: 77  SpO2: 95%  Weight: 220 lb 3.2 oz (99.9 kg)   EKG:   PHYSICAL EXAM: General:  Well appearing. No resp difficulty. Ambulated in the clinic without difficulty.   HEENT: normal Neck: supple. JVP flat. Carotids 2+ bilaterally; no bruits. No lymphadenopathy or thryomegaly appreciated. Cor: PMI normal. Irregular. Regular rate & rhythm. No rubs, gallops or murmurs. Lungs: clear Abdomen: obese, soft, nontender, nondistended. No hepatosplenomegaly. No  bruits or masses. Good bowel sounds. Extremities: no cyanosis, clubbing, rash, edema Neuro: alert & orientedx3, cranial nerves grossly intact. Moves all 4 extremities w/o difficulty. Affect pleasant.  EKG: NSR 65 bpm with PVCs.   ASSESSMENT & PLAN: 1. Chronic Systolic Heart Failure- NICM- ECHO 05/2016  LVEF ~25. CMRI no infiltrative/inflamatory process and EF ~25%. Plan to repeat ECHO after HF meds optimized. Should be around December  NYHA II. Volume status stable. Continue 25 mg spiro daily. Continue carvedilol to 6.25 mg twice a day.  Heart Rate 65 will not increase. Consider increase ing night time dose next visit.   Continue losartan 100 mg daily and add 50 mg losartan  in the pm.  Continue hydralazine 100 mg three times a day. No imdur with headache.  Discussed the importance of preventing pregnancy. Discussed using condoms to prevent pregnancy.  Today we will provide medications from HF fund. Reinforced daily weights, low salty food choices, and limiting fluid intake to < 2 liters daily.  2. HTN- BP better today but still elevated. Continue coreg to 6.25  mg twice a day and increase amlodipine to 10 mg daily . Continue other medications.   3. Headache- resolved.   4. PVCs-Continue amio 200 mg twice daily. Had eye exam yesterday. Needs TSH/LFTs  check next visit.   She has been referred to [facility] for [facility]. Paramedic, [name] present for visit.     Follow up 4 weeks. She will need BMET, TSH, LFTs at that time.   Amy Clegg NP-C  9:59 AM

## 2016-07-19 NOTE — Progress Notes (Signed)
Advanced Heart Failure Medication Review by a Pharmacist  Does the patient  feel that his/her medications are working for him/her?  yes  Has the patient been experiencing any side effects to the medications prescribed?  no  Does the patient measure his/her own blood pressure or blood glucose at home?  yes   Does the patient have any problems obtaining medications due to transportation or finances?   no  Understanding of regimen: good Understanding of indications: good Potential of compliance: good Patient understands to avoid NSAIDs. Patient understands to avoid decongestants.  Issues to address at subsequent visits: None   Pharmacist comments:  Mandy Serrano is a pleasant 40 yo F presenting with her medication bottles and [name] (paramedicine). She reports good compliance with her regimen and did not have any specific medication-related questions or concerns for me at this time.   Tyler Deis. [name], PharmD, BCPS, CPP Clinical Pharmacist Pager: [phone] Phone: [phone] 07/19/2016 10:04 AM      Time with patient: 10 minutes Preparation and documentation time: 2 minutes Total time: 12 minutes

## 2016-07-19 NOTE — Patient Instructions (Addendum)
INCREASE Amlodipine to 10 mg once daily. Can take 4 of your 2.5mg  tablets that you currently have. New Rx will be a 10 mg tablet, take one of these once daily.  Follow up 4 weeks with Amy Clegg NP-C.  Do the following things EVERYDAY: 1) Weigh yourself in the morning before breakfast. Write it down and keep it in a log. 2) Take your medicines as prescribed 3) Eat low salt foods-Limit salt (sodium) to 2000 mg per day.  4) Stay as active as you can everyday 5) Limit all fluids for the day to less than 2 liters

## 2016-08-15 NOTE — Progress Notes (Signed)
PCP: None  Primary Cardiologist: Dr Gala RomneyBensimhon   HPI Ms Alena BillsKasidin is a 40 year old with history of HTN ( was [place] 5 anti[place]N medicines in [place]  newly diagnosed systolic heart failure 05/2016, headaches, and frequent PVCs. Has been in [place] for 1 [place]. No insurance.   Admitted with increased dyspnea and HTN crisis on 06/12/16. She had not been taking any medications over the last last year. New acute systolic heart identified on ECHO. LVEF ~25. CMRI no infiltrative/inflamatory process and EF ~25%. Had high PVC burden so she was started on amio. TSH was normal. Unable to obtain Entresto because she is not [place] citizen. Discharge weight 225 pounds.   Today she presents for HF follow up. Last visit amlodipine was increased to 10 mg daily. SBP at home 130/90. Overall feeling good. Denies SOB/PND/Orthopnea. Weight at home 218-220 pounds. Taking all medications. Followed by Paramedicine. Taking all medications.   Labs 06/22/2016: K 5.1 Creatinine 1.15  Labs 06/29/2016: K 5.1 Creatinine 1.29    ROS: All systems negative except as listed in HPI, PMH and Problem List.  SH:  Social History   Social History  . Marital status: Married    Spouse name: N/A  . Number of children: N/A  . Years of education: N/A   Occupational History  . Not on file.   Social History Main Topics  . Smoking status: Never Smoker  . Smokeless tobacco: Never Used  . Alcohol use Not on file  . Drug use: Unknown  . Sexual activity: Not on file   Other Topics Concern  . Not on file   Social History Narrative  . No narrative on file    FH: No family history on file.  No past medical history on file.  Current Outpatient Prescriptions  Medication Sig Dispense Refill  . acetaminophen (TYLENOL) 500 MG tablet Take 500 mg by mouth every 6 (six) hours as needed for mild pain or headache.    Marland Kitchen. amiodarone (PACERONE) 200 MG tablet Take 1 tablet (200 mg total) by mouth 2 (two) times daily. 60 tablet 5  . amLODipine (NORVASC) 10  MG tablet Take 1 tablet (10 mg total) by mouth daily. 30 tablet 6  . aspirin EC 81 MG EC tablet Take 1 tablet (81 mg total) by mouth daily.    . carvedilol (COREG) 6.25 MG tablet Take 1 tablet (6.25 mg total) by mouth 2 (two) times daily with a meal. 60 tablet 3  . hydrALAZINE (APRESOLINE) 100 MG tablet Take 1 tablet (100 mg total) by mouth 3 (three) times daily. 90 tablet 3  . losartan (COZAAR) 100 MG tablet Take 0.5-1 tablets (50-100 mg total) by mouth as directed. Take 100mg   (1 tab) in the AM and 50mg  (one half tab) in the PM 45 tablet 5  . spironolactone (ALDACTONE) 25 MG tablet Take 1 tablet (25 mg total) by mouth daily. 30 tablet 2   No current facility-administered medications for this encounter.     Vitals:   08/16/16 0903  BP: (!) 160/94  Pulse: 74  SpO2: 99%  Weight: 221 lb 6.4 oz (100.4 kg)   PHYSICAL EXAM: General:  Well appearing. No resp difficulty. Ambulated in the clinic without difficulty.   HEENT: normal Neck: supple. JVP flat. Carotids 2+ bilaterally; no bruits. No lymphadenopathy or thryomegaly appreciated. Cor: PMI normal. Irregular. Regular rate & rhythm. No rubs, gallops or murmurs. Lungs: clear Abdomen: obese, soft, nontender, nondistended. No hepatosplenomegaly. No bruits or masses. Good bowel sounds. Extremities:  no cyanosis, clubbing, rash, edema Neuro: alert & orientedx3, cranial nerves grossly intact. Moves all 4 extremities w/o difficulty. Affect pleasant.  EKG:  NSR 74 bpm No PVCs   ASSESSMENT & PLAN: 1. Chronic Systolic Heart Failure- NICM- ECHO 05/2016  LVEF ~25-30%. CMRI no infiltrative/inflamatory process and EF ~25%. Repeat ECHO after HF meds optimized.  NYHA II. Volume status stable. Continue 25 mg spiro daily. Increase carvedilol to 9.375 mg wice a day.  Heart Rate 74 with no PVCs. Medication bottle relabeled today.    Continue losartan 100 mg daily and add 50 mg losartan in the pm. Unable to switch to entresto as she is not a [place] citizen.   Continue hydralazine 100 mg three times a day. Consider re-challenge with Imdur 15 mg bed time at next visit.    Discussed the importance of preventing pregnancy. Discussed using condoms to prevent pregnancy.  Today we will provide medications from HF fund. Reinforced daily weights, low salty food choices, and limiting fluid intake to < 2 liters daily.  2. HTN- BP elevated but at home SBP 130-140. Increase coreg to 9./375 mg twice a day.  Continue current medications.    3. Headache- resolved.   4. PVCs-No PVC noted EKG. Continue amio 200 mg twice daily. Had eye exam yesterday. Needs TSH/LFTs  check next visit.   Continue [facility]. Paramedic, [name] updated.      Follow up 2 weeks. She will need BMET, TSH, LFTs    Azelie Noguera NP-C  3:47 PM

## 2016-08-16 ENCOUNTER — Ambulatory Visit (HOSPITAL_COMMUNITY)
Admission: RE | Admit: 2016-08-16 | Discharge: 2016-08-16 | Disposition: A | Discharge status: Home / Self Care | Payer: Self-pay | Source: Ambulatory Visit | Attending: Cardiology | Admitting: Cardiology

## 2016-08-16 VITALS — BP 160/94 | HR 74 | Wt 221.4 lb

## 2016-08-16 DIAGNOSIS — I10 Essential (primary) hypertension: Secondary | ICD-10-CM

## 2016-08-16 DIAGNOSIS — I110 Hypertensive heart disease with heart failure: Secondary | ICD-10-CM | POA: Insufficient documentation

## 2016-08-16 DIAGNOSIS — I493 Ventricular premature depolarization: Secondary | ICD-10-CM

## 2016-08-16 DIAGNOSIS — I5022 Chronic systolic (congestive) heart failure: Secondary | ICD-10-CM

## 2016-08-16 DIAGNOSIS — Z7982 Long term (current) use of aspirin: Secondary | ICD-10-CM | POA: Insufficient documentation

## 2016-08-16 MED ORDER — CARVEDILOL 6.25 MG PO TABS: 9.3750 mg | ORAL_TABLET | Freq: Two times a day (BID) | ORAL | 3 refills | Status: DC

## 2016-08-16 MED FILL — CARVEDILOL 6.25 MG TABLET: 6.25 | 30 days supply | Qty: 90 | Fill #0

## 2016-08-16 NOTE — Patient Instructions (Signed)
INCREASE Coreg to 9.375mg , one and one half tab twice a day  Your physician recommends that you schedule a follow-up appointment in: 2 weeks with Amy Clegg,NP  Do the following things EVERYDAY: 1) Weigh yourself in the morning before breakfast. Write it down and keep it in a log. 2) Take your medicines as prescribed 3) Eat low salt foods-Limit salt (sodium) to 2000 mg per day.  4) Stay as active as you can everyday 5) Limit all fluids for the day to less than 2 liters

## 2016-08-16 NOTE — Progress Notes (Signed)
Advanced Heart Failure Medication Review by a Pharmacist  Does the patient  feel that his/her medications are working for him/her?  yes  Has the patient been experiencing any side effects to the medications prescribed?  no  Does the patient measure his/her own blood pressure or blood glucose at home?  yes   Does the patient have any problems obtaining medications due to transportation or finances?   yes  Understanding of regimen: good Understanding of indications: good Potential of compliance: good Patient understands to avoid NSAIDs. Patient understands to avoid decongestants.  Issues to address at subsequent visits: None   Pharmacist comments:  Ms. Karren is a pleasant 40 yo F presenting with her medication bottles (also sees [name] with paramedicine). She reports great compliance with her regimen and did not have any specific medication-related questions or concerns for me at this time.   Tyler Deis. [name], PharmD, BCPS, CPP Clinical Pharmacist Pager: [phone] Phone: [phone] 08/16/2016 9:13 AM      Time with patient: 10 minutes Preparation and documentation time: 2 minutes Total time: 12 minutes

## 2016-08-16 NOTE — Progress Notes (Signed)
CSW met with patient in the clinic to follow up on previous visit. Patient needed primary care provider and was referred last visit to [place]. Patient reports she was seen although the provider is full. Patient states she is to return call on November 1st for opening with provider. Patient states she has all needed medications and denies any other concerns at this time. CSW available as needed. Raquel Sarna, LCSW [phone]

## 2016-08-30 ENCOUNTER — Ambulatory Visit (HOSPITAL_COMMUNITY)
Admission: RE | Admit: 2016-08-30 | Discharge: 2016-08-30 | Disposition: A | Discharge status: Home / Self Care | Payer: Self-pay | Source: Ambulatory Visit | Attending: Internal Medicine | Admitting: Internal Medicine

## 2016-08-30 VITALS — BP 158/90 | HR 60 | Wt 221.6 lb

## 2016-08-30 DIAGNOSIS — I10 Essential (primary) hypertension: Secondary | ICD-10-CM

## 2016-08-30 DIAGNOSIS — I110 Hypertensive heart disease with heart failure: Secondary | ICD-10-CM | POA: Insufficient documentation

## 2016-08-30 DIAGNOSIS — R51 Headache: Secondary | ICD-10-CM | POA: Insufficient documentation

## 2016-08-30 DIAGNOSIS — I5022 Chronic systolic (congestive) heart failure: Secondary | ICD-10-CM | POA: Insufficient documentation

## 2016-08-30 DIAGNOSIS — Z7982 Long term (current) use of aspirin: Secondary | ICD-10-CM | POA: Insufficient documentation

## 2016-08-30 DIAGNOSIS — I493 Ventricular premature depolarization: Secondary | ICD-10-CM

## 2016-08-30 MED ORDER — HYDROCHLOROTHIAZIDE 25 MG PO TABS: 25.0000 mg | ORAL_TABLET | Freq: Every day | ORAL | 3 refills | Status: DC

## 2016-08-30 MED FILL — AMLODIPINE BESYLATE 10 MG T: 10 | 30 days supply | Qty: 30 | Fill #1

## 2016-08-30 MED FILL — HYDROCHLOROTHIAZIDE 25 MG T: 25 | 30 days supply | Qty: 30 | Fill #0

## 2016-08-30 MED FILL — hydrALAZINE HCL 100 MG TABS: 100 | 30 days supply | Qty: 90 | Fill #1

## 2016-08-30 MED FILL — AMIODARONE HCL 200 MG TAB: 200 | 30 days supply | Qty: 60 | Fill #1

## 2016-08-30 NOTE — Patient Instructions (Signed)
START Hydrochlorothiazide (HCTZ) 25 mg tablet once daily.  Follow up 2 weeks with Amy Clegg NP-C.  Do the following things EVERYDAY: 1) Weigh yourself in the morning before breakfast. Write it down and keep it in a log. 2) Take your medicines as prescribed 3) Eat low salt foods-Limit salt (sodium) to 2000 mg per day.  4) Stay as active as you can everyday 5) Limit all fluids for the day to less than 2 liters

## 2016-08-30 NOTE — Progress Notes (Signed)
PCP: None  Primary Cardiologist: Dr Gala Romney   HPI Ms Molyneaux is a 40 year old with history of HTN ( was on 5 anti-HTN medicines in [place]),  newly diagnosed systolic heart failure 05/2016, headaches, and frequent PVCs. Has been in [place] for 1 year. No insurance.   Admitted with increased dyspnea and HTN crisis on 06/12/16. She had not been taking any medications over the last last year. New acute systolic heart identified on ECHO. LVEF ~25. CMRI no infiltrative/inflamatory process and EF ~25%. Had high PVC burden so she was started on amio. TSH was normal. Unable to obtain Entresto because she is not [place] citizen. Discharge weight 225 pounds.   Today she presents for HF follow up. Lat visit carvedilol was increased to 9.375 mg twice a day. SBP at home 130/90. (wrist BP machine) Overall feeling good. Denies SOB/PND/Orthopnea. Scale broken. Taking all medications. Followed by Paramedicine. Weight at home from paramedicine 218 pounds. Taking all medications. Not exercising.   Labs 06/22/2016: K 5.1 Creatinine 1.15  Labs 06/29/2016: K 5.1 Creatinine 1.29    ROS: All systems negative except as listed in HPI, PMH and Problem List.  SH:  Social History   Social History  . Marital status: Married    Spouse name: N/A  . Number of children: N/A  . Years of education: N/A   Occupational History  . Not on file.   Social History Main Topics  . Smoking status: Never Smoker  . Smokeless tobacco: Never Used  . Alcohol use Not on file  . Drug use: Unknown  . Sexual activity: Not on file   Other Topics Concern  . Not on file   Social History Narrative  . No narrative on file    FH: No family history on file.  No past medical history on file.  Current Outpatient Prescriptions  Medication Sig Dispense Refill  . acetaminophen (TYLENOL) 500 MG tablet Take 500 mg by mouth every 6 (six) hours as needed for mild pain or headache.    Marland Kitchen amiodarone (PACERONE) 200 MG tablet Take 1 tablet (200 mg total)  by mouth 2 (two) times daily. 60 tablet 5  . amLODipine (NORVASC) 10 MG tablet Take 1 tablet (10 mg total) by mouth daily. 30 tablet 6  . aspirin EC 81 MG EC tablet Take 1 tablet (81 mg total) by mouth daily.    . carvedilol (COREG) 6.25 MG tablet Take 1.5 tablets (9.375 mg total) by mouth 2 (two) times daily with a meal. 90 tablet 3  . hydrALAZINE (APRESOLINE) 100 MG tablet Take 1 tablet (100 mg total) by mouth 3 (three) times daily. 90 tablet 3  . losartan (COZAAR) 100 MG tablet Take 0.5-1 tablets (50-100 mg total) by mouth as directed. Take 100mg   (1 tab) in the AM and 50mg  (one half tab) in the PM 45 tablet 5  . spironolactone (ALDACTONE) 25 MG tablet Take 1 tablet (25 mg total) by mouth daily. 30 tablet 2   No current facility-administered medications for this encounter.     Vitals:   08/30/16 1354  BP: (!) 158/90  Pulse: 60  SpO2: 96%  Weight: 221 lb 9.6 oz (100.5 kg)   PHYSICAL EXAM: General:  Well appearing. No resp difficulty. Ambulated in the clinic without difficulty.  [name] Paramedic present.  HEENT: normal Neck: supple. JVP 7-8 . Carotids 2+ bilaterally; no bruits. No lymphadenopathy or thryomegaly appreciated. Cor: PMI normal. Irregular. Regular rate & rhythm. No rubs, gallops or murmurs. Lungs: clear  Abdomen: obese, soft, nontender, nondistended. No hepatosplenomegaly. No bruits or masses. Good bowel sounds. Extremities: no cyanosis, clubbing, rash, edema Neuro: alert & orientedx3, cranial nerves grossly intact. Moves all 4 extremities w/o difficulty. Affect pleasant.   ASSESSMENT & PLAN: 1. Chronic Systolic Heart Failure- NICM- ECHO 05/2016  LVEF ~25-30%. CMRI no infiltrative/inflamatory process and EF ~25%. Repeat ECHO after HF meds optimized.  NYHA II. Volume status stable. Continue 25 mg spiro daily.  Continue carvedilol to 9.375 mg wice a day.  Heart Rate 60 for this reason I will not increase.    Continue losartan 100 mg daily and add 50 mg losartan in the pm.  Unable to switch to entresto as she is not a [place] citizen.  Continue hydralazine 100 mg three times a day. Consider re-challenge with Imdur 15 mg bed time at next visit.    Discussed the importance of preventing pregnancy. Discussed using condoms to prevent pregnancy.  Today we will provide medications from HF fund. Reinforced daily weights, low salty food choices, and limiting fluid intake to < 2 liters daily.  2. HTN- BP elevated but at home SBP 130-140. Asked her to bring wrist BP monitor next visit. Continue current regimen and add 25 mg hctz daily.    3. Headache- resolved.   4. PVCs-No PVC noted EKG. Continue amio 200 mg twice daily. Had eye exam yesterday. Needs TSH/LFTs  check next visit.   [facility]. Paramedic, [name] present for visit.       Follow up 2 weeks. Anticipate setting up ECHO next visit. Marland Kitchen.   Nickalous Stingley NP-C  2:09 PM

## 2016-09-12 ENCOUNTER — Ambulatory Visit (HOSPITAL_COMMUNITY)
Admission: RE | Admit: 2016-09-12 | Discharge: 2016-09-12 | Disposition: A | Discharge status: Home / Self Care | Payer: Self-pay | Source: Ambulatory Visit | Attending: Cardiology | Admitting: Cardiology

## 2016-09-12 VITALS — BP 152/100 | HR 77 | Wt 221.4 lb

## 2016-09-12 DIAGNOSIS — I110 Hypertensive heart disease with heart failure: Secondary | ICD-10-CM | POA: Insufficient documentation

## 2016-09-12 DIAGNOSIS — I5022 Chronic systolic (congestive) heart failure: Secondary | ICD-10-CM | POA: Insufficient documentation

## 2016-09-12 DIAGNOSIS — Z7982 Long term (current) use of aspirin: Secondary | ICD-10-CM | POA: Insufficient documentation

## 2016-09-12 DIAGNOSIS — I10 Essential (primary) hypertension: Secondary | ICD-10-CM

## 2016-09-12 DIAGNOSIS — I493 Ventricular premature depolarization: Secondary | ICD-10-CM

## 2016-09-12 DIAGNOSIS — R51 Headache: Secondary | ICD-10-CM | POA: Insufficient documentation

## 2016-09-12 LAB — BASIC METABOLIC PANEL
ANION GAP: 9 (ref 5–15)
BUN: 25 mg/dL — ABNORMAL HIGH (ref 6–20)
CO2: 25 mmol/L (ref 22–32)
Calcium: 9.6 mg/dL (ref 8.9–10.3)
Chloride: 101 mmol/L (ref 101–111)
Creatinine, Ser: 1.44 mg/dL — ABNORMAL HIGH (ref 0.44–1.00)
GFR calc Af Amer: 51 mL/min — ABNORMAL LOW (ref >60)
GFR calc non Af Amer: 44 mL/min — ABNORMAL LOW (ref >60)
GLUCOSE: 95 mg/dL (ref 65–99)
POTASSIUM: 4.2 mmol/L (ref 3.5–5.1)
Sodium: 135 mmol/L (ref 135–145)

## 2016-09-12 MED FILL — SPIRONOLACTONE 25 MG TABLET: 25 | 34 days supply | Qty: 34 | Fill #1

## 2016-09-12 NOTE — Addendum Note (Signed)
Encounter addended by: Marcy Siren, LCSW on: 09/12/2016  4:24 PM<BR>    Actions taken: Sign clinical note

## 2016-09-12 NOTE — Progress Notes (Signed)
PCP: None  Primary Cardiologist: Dr Mandy Serrano   HPI Mandy Serrano is a 40 year old with history of HTN ( was on 5 anti-HTN medicines in [place]),  newly diagnosed systolic heart failure 05/2016, headaches, and frequent PVCs. Has been in [place] for 1 year. No insurance.   Admitted with increased dyspnea and HTN crisis on 06/12/16. She had not been taking any medications over the last last year. New acute systolic heart identified on ECHO. LVEF ~25. CMRI no infiltrative/inflamatory process and EF ~25%. Had high PVC burden so she was started on amio. TSH was normal. Unable to obtain Entresto because she is not [place] citizen. Discharge weight 225 pounds.   Today she presents for HF follow up with paramedicine. Last visit HCTZ was added. SBP at home 110-120.  BP at home verified with Para medicine.  Overall feeling good. Denies SOB/PND/Orthopnea. Taking all medications. Followed by Paramedicine weekly. Weight at home from paramedicine 221 pounds. Walking in the apartment without difficulty.   Labs 06/22/2016: K 5.1 Creatinine 1.15  Labs 06/29/2016: K 5.1 Creatinine 1.29    ROS: All systems negative except as listed in HPI, PMH and Problem List.  SH:  Social History   Social History  . Marital status: Married    Spouse name: N/A  . Number of children: N/A  . Years of education: N/A   Occupational History  . Not on file.   Social History Main Topics  . Smoking status: Never Smoker  . Smokeless tobacco: Never Used  . Alcohol use Not on file  . Drug use: Unknown  . Sexual activity: Not on file   Other Topics Concern  . Not on file   Social History Narrative  . No narrative on file    FH: No family history on file.  No past medical history on file.  Current Outpatient Prescriptions  Medication Sig Dispense Refill  . acetaminophen (TYLENOL) 500 MG tablet Take 500 mg by mouth every 6 (six) hours as needed for mild pain or headache.    Marland Kitchen amiodarone (PACERONE) 200 MG tablet Take 1 tablet (200 mg  total) by mouth 2 (two) times daily. 60 tablet 5  . amLODipine (NORVASC) 10 MG tablet Take 1 tablet (10 mg total) by mouth daily. 30 tablet 6  . aspirin EC 81 MG EC tablet Take 1 tablet (81 mg total) by mouth daily.    . carvedilol (COREG) 6.25 MG tablet Take 1.5 tablets (9.375 mg total) by mouth 2 (two) times daily with a meal. 90 tablet 3  . hydrALAZINE (APRESOLINE) 100 MG tablet Take 1 tablet (100 mg total) by mouth 3 (three) times daily. 90 tablet 3  . hydrochlorothiazide (HYDRODIURIL) 25 MG tablet Take 1 tablet (25 mg total) by mouth daily. 30 tablet 3  . losartan (COZAAR) 100 MG tablet Take 0.5-1 tablets (50-100 mg total) by mouth as directed. Take 100mg   (1 tab) in the AM and 50mg  (one half tab) in the PM 45 tablet 5  . spironolactone (ALDACTONE) 25 MG tablet Take 1 tablet (25 mg total) by mouth daily. 30 tablet 2   No current facility-administered medications for this encounter.     Vitals:   09/12/16 1155  BP: (!) 152/100  Pulse: 77  SpO2: 98%  Weight: 221 lb 6.4 oz (100.4 kg)   PHYSICAL EXAM: General:  Well appearing. No resp difficulty. Ambulated in the clinic without difficulty.  Mandy Serrano present.  HEENT: normal Neck: supple. JVP  5-6  . Carotids 2+  bilaterally; no bruits. No lymphadenopathy or thryomegaly appreciated. Cor: PMI normal. Irregular. Regular rate & rhythm. No rubs, gallops or murmurs. Lungs: clear Abdomen: obese, soft, nontender, nondistended. No hepatosplenomegaly. No bruits or masses. Good bowel sounds. Extremities: no cyanosis, clubbing, rash, edema Neuro: alert & orientedx3, cranial nerves grossly intact. Moves all 4 extremities w/o difficulty. Affect pleasant.   ASSESSMENT & PLAN: 1. Chronic Systolic Heart Failure- NICM- ECHO 05/2016  LVEF ~25-30%. CMRI no infiltrative/inflamatory process and EF ~25%. Repeat ECHO next visit. fter HF meds optimized.  NYHA II. Volume status stable. Continue 25 mg spiro daily.  Continue carvedilol to 9.375 mg wice a  day.   Continue losartan 100 mg daily and add 50 mg losartan in the pm. Unable to switch to entresto as she is not a [place] citizen.  Continue hydralazine 100 mg three times a day. Consider re-challenge with Imdur 15 mg bed time at next visit.    Discussed the importance of preventing pregnancy. Discussed using condoms to prevent pregnancy.  Medications provided from the HF fund. Reinforced daily weights, low salty food choices, and limiting fluid intake to < 2 liters daily.  2. HTN- BP elevated but at home SBP 110-120 at home. This was verified with manual BP by Serrano.      3. Headache- resolved.   4. PVCs-No PVC noted EKG. Continue amio 200 mg twice daily. Had eye exam yesterday. Needs TSH/LFTs  check next visit.   [facility]. Serrano, Mandy present for visit.    Follow up in 1 month with Dr Mandy RomneyBensimhon and will check ECHO at that time.      Mandy Serrano.   Mandy Natzke NP-C  12:13 PM

## 2016-09-12 NOTE — Patient Instructions (Signed)
Follow up 1 month with echocardiogram and appointment with Dr. Gala Romney.  Routine lab work today. Will notify you of abnormal results, otherwise no news is good news!  No changes to medications today.  Do the following things EVERYDAY: 1) Weigh yourself in the morning before breakfast. Write it down and keep it in a log. 2) Take your medicines as prescribed 3) Eat low salt foods-Limit salt (sodium) to 2000 mg per day.  4) Stay as active as you can everyday 5) Limit all fluids for the day to less than 2 liters

## 2016-09-12 NOTE — Progress Notes (Signed)
CSW met with patient in the clinic with [name] from paramedicine program. Patient in good spirits and states she is doing well. Patient reports she has a pending medicaid application and received request for further information. CSW assisted with follow up and return letter to [facility]. Patient will continue to be seen through paramedicine program. CSW available as needed. Raquel Sarna, LCSW [phone]

## 2016-09-12 NOTE — Progress Notes (Signed)
Advanced Heart Failure Medication Review by a Pharmacist  Does the patient  feel that his/her medications are working for him/her?  yes  Has the patient been experiencing any side effects to the medications prescribed?  no  Does the patient measure his/her own blood pressure or blood glucose at home?  yes   Does the patient have any problems obtaining medications due to transportation or finances?   yes  Understanding of regimen: good Understanding of indications: good Potential of compliance: good Patient understands to avoid NSAIDs. Patient understands to avoid decongestants.  Issues to address at subsequent visits: None   Pharmacist comments:  Mandy Serrano is a pleasant 40 yo F presenting with her medication bottles and [name] (paramedicine). She reports great compliance with her regimen and did not have any specific medication-related questions or concerns for me at this time.   Tyler Deis. [name], PharmD, BCPS, CPP Clinical Pharmacist Pager: [phone] Phone: [phone] 09/12/2016 12:27 PM      Time with patient: 10 minutes Preparation and documentation time: 2 minutes Total time: 12 minutes

## 2016-09-19 ENCOUNTER — Other Ambulatory Visit: Payer: Self-pay | Admitting: Family Medicine

## 2016-09-19 ENCOUNTER — Encounter: Payer: Self-pay | Admitting: Family Medicine

## 2016-09-19 ENCOUNTER — Ambulatory Visit (INDEPENDENT_AMBULATORY_CARE_PROVIDER_SITE_OTHER): Payer: No Typology Code available for payment source | Admitting: Family Medicine

## 2016-09-19 VITALS — BP 141/86 | HR 85 | Temp 98.8°F | Resp 16 | Ht 61.0 in | Wt 222.0 lb

## 2016-09-19 DIAGNOSIS — I10 Essential (primary) hypertension: Secondary | ICD-10-CM

## 2016-09-19 DIAGNOSIS — Z23 Encounter for immunization: Secondary | ICD-10-CM

## 2016-09-19 DIAGNOSIS — Z0000 Encounter for general adult medical examination without abnormal findings: Secondary | ICD-10-CM

## 2016-09-19 LAB — LIPID PANEL
CHOL/HDL RATIO: 4.4 ratio (ref <5.0)
CHOLESTEROL: 223 mg/dL — AB (ref <200)
HDL: 51 mg/dL (ref >50)
LDL Cholesterol: 151 mg/dL — ABNORMAL HIGH (ref <100)
TRIGLYCERIDES: 104 mg/dL (ref <150)
VLDL: 21 mg/dL (ref <30)

## 2016-09-19 LAB — TSH: TSH: 6.21 mIU/L — ABNORMAL HIGH

## 2016-09-19 NOTE — Patient Instructions (Signed)
Make appointment to come back for PAP smear to check for cervical cancer.

## 2016-09-20 LAB — HEMOGLOBIN A1C
Hgb A1c MFr Bld: 5.5 % (ref <5.7)
Mean Plasma Glucose: 111 mg/dL

## 2016-09-20 LAB — HIV ANTIBODY (ROUTINE TESTING W REFLEX): HIV: NONREACTIVE

## 2016-09-20 LAB — MICROALBUMIN, URINE: MICROALB UR: 2.5 mg/dL

## 2016-09-20 LAB — T4, FREE: Free T4: 1.4 ng/dL (ref 0.8–1.8)

## 2016-09-20 NOTE — Progress Notes (Signed)
This has been added to Labs.

## 2016-09-20 NOTE — Progress Notes (Signed)
Mandy Serrano, is a 40 y.o. female  ZOX:[id number]:[id number]  WJX:[id number]  DOB - 05/04/1976  CC:  Chief Complaint  Patient presents with  . Establish Care  . Hospitalization Follow-up       HPI: Mandy Campanilernie Alcaide is a 40 y.o. female here to establish care. She was in hospital recently for hypertensive urgency and was referred here for primary care. She is also to be followed by cardiology. She is currently on amlddipine 10, coreg 6.25, apresoline 100, hctz 25, cozaar 100 and aldactone 25 also. She was diagnosed with accelerated hypertension, CHF, pulmonary edema and PVC while in the hospital. She reports doing well and having no problems since discharge. She has no other chronic illnesses and her only other medication is occ tylenol. Her blood pressure on arrival today was 141/86, Repeat 130/80.   Health maintenance: Is to receive flu shot today. She is in need of mammogram and PAP, as well as HIV screening.   No Known Allergies History reviewed. No pertinent past medical history. Current Outpatient Prescriptions on File Prior to Visit  Medication Sig Dispense Refill  . acetaminophen (TYLENOL) 500 MG tablet Take 500 mg by mouth every 6 (six) hours as needed for mild pain or headache.    Marland Kitchen. amiodarone (PACERONE) 200 MG tablet Take 1 tablet (200 mg total) by mouth 2 (two) times daily. 60 tablet 5  . amLODipine (NORVASC) 10 MG tablet Take 1 tablet (10 mg total) by mouth daily. 30 tablet 6  . aspirin EC 81 MG EC tablet Take 1 tablet (81 mg total) by mouth daily.    . carvedilol (COREG) 6.25 MG tablet Take 1.5 tablets (9.375 mg total) by mouth 2 (two) times daily with a meal. 90 tablet 3  . hydrALAZINE (APRESOLINE) 100 MG tablet Take 1 tablet (100 mg total) by mouth 3 (three) times daily. 90 tablet 3  . hydrochlorothiazide (HYDRODIURIL) 25 MG tablet Take 1 tablet (25 mg total) by mouth daily. 30 tablet 3  . losartan (COZAAR) 100 MG tablet Take 0.5-1 tablets (50-100 mg total) by mouth as directed. Take  100mg   (1 tab) in the AM and 50mg  (one half tab) in the PM 45 tablet 5  . spironolactone (ALDACTONE) 25 MG tablet Take 1 tablet (25 mg total) by mouth daily. 30 tablet 2   No current facility-administered medications on file prior to visit.    History reviewed. No pertinent family history. Social History   Social History  . Marital status: Married    Spouse name: N/A  . Number of children: N/A  . Years of education: N/A   Occupational History  . Not on file.   Social History Main Topics  . Smoking status: Never Smoker  . Smokeless tobacco: Never Used  . Alcohol use No  . Drug use: No  . Sexual activity: Not on file   Other Topics Concern  . Not on file   Social History Narrative  . No narrative on file    Review of Systems: Constitutional: Negative Skin: Negative, intermittent itching HENT: Negative  Eyes: Negative , glasses Neck: Negative Respiratory: Negative Cardiovascular: Negative Gastrointestinal: Negative Genitourinary: Negative  Musculoskeletal: Negative   Neurological: Occ headache Hematological: Negative  Psychiatric/Behavioral: Negative    Objective:   Vitals:   09/19/16 1400 09/19/16 1437  BP: (!) 150/95 (!) 141/86  Pulse: 85   Resp: 16   Temp: 98.8 F (37.1 C)     Physical Exam: Constitutional: Patient appears well-developed and well-nourished. No distress. HENT:  Normocephalic, atraumatic, External right and left ear normal. Oropharynx is clear and moist.  Eyes: Conjunctivae and EOM are normal. PERRLA, no scleral icterus. Neck: Normal ROM. Neck supple. No lymphadenopathy, No thyromegaly. CVS: RRR, S1/S2 +, no murmurs, no gallops, no rubs Pulmonary: Effort and breath sounds normal, no stridor, rhonchi, wheezes, rales.  Abdominal: Soft. Normoactive BS,, no distension, tenderness, rebound or guarding.  Musculoskeletal: Normal range of motion. No edema and no tenderness.  Neuro: Alert.Normal muscle tone coordination. Non-focal Skin: Skin is  warm and dry. No rash noted. Not diaphoretic. No erythema. No pallor. Psychiatric: Normal mood and affect. Behavior, judgment, thought content normal.  Lab Results  Component Value Date   WBC 7.8 06/16/2016   HGB 13.4 06/16/2016   HCT 42.7 06/16/2016   MCV 94.3 06/16/2016   PLT 282 06/16/2016   Lab Results  Component Value Date   CREATININE 1.44 (H) 09/12/2016   BUN 25 (H) 09/12/2016   NA 135 09/12/2016   K 4.2 09/12/2016   CL 101 09/12/2016   CO2 25 09/12/2016    Lab Results  Component Value Date   HGBA1C 5.5 09/19/2016   Lipid Panel     Component Value Date/Time   CHOL 223 (H) 09/19/2016 1436   TRIG 104 09/19/2016 1436   HDL 51 09/19/2016 1436   CHOLHDL 4.4 09/19/2016 1436   VLDL 21 09/19/2016 1436   LDLCALC 151 (H) 09/19/2016 1436        Assessment and plan:   1. Healthcare maintenance  - Flu Vaccine QUAD 36+ mos PF IM (Fluarix & Fluzone Quad PF) - Hemoglobin A1c - MM DIGITAL SCREENING BILATERAL; Future -Return in near future for PAP.   2. Essential hypertension  - Lipid panel - TSH - HIV antibody (with reflex) - Microalbumin, urine     Return in about 3 months (around 12/20/2016).  The patient was given clear instructions to go to ER or return to medical center if symptoms don't improve, worsen or new problems develop. The patient verbalized understanding.    Henrietta Hoover FNP  09/20/2016, 10:32 AM

## 2016-09-21 LAB — T3: T3 TOTAL: 86 ng/dL (ref 76–181)

## 2016-09-28 ENCOUNTER — Encounter (HOSPITAL_COMMUNITY): Payer: Self-pay

## 2016-09-28 MED FILL — HYDROCHLOROTHIAZIDE 25 MG T: 25 | 30 days supply | Qty: 30 | Fill #1

## 2016-09-28 NOTE — Progress Notes (Signed)
[facility] mailed medical record request dated 09/13/2016 requesting records 10/2014--present. Medical records from CHF clinic 06/15/2016 (1st encounter)--present faxed to provided # [phone]. Copy of medical record request scanned into patient's electronic medical record. Case # [id number]  Ave Filter, RN

## 2016-10-02 MED FILL — AMLODIPINE BESYLATE 10 MG T: 10 | 30 days supply | Qty: 30 | Fill #2

## 2016-10-02 MED FILL — AMIODARONE HCL 200 MG TAB: 200 | 30 days supply | Qty: 60 | Fill #2

## 2016-10-11 ENCOUNTER — Encounter (HOSPITAL_COMMUNITY): Payer: Self-pay | Admitting: Internal Medicine

## 2016-10-11 ENCOUNTER — Ambulatory Visit (HOSPITAL_BASED_OUTPATIENT_CLINIC_OR_DEPARTMENT_OTHER)
Admission: RE | Admit: 2016-10-11 | Discharge: 2016-10-11 | Disposition: A | Discharge status: Home / Self Care | Payer: Self-pay | Source: Ambulatory Visit | Attending: Internal Medicine | Admitting: Internal Medicine

## 2016-10-11 ENCOUNTER — Ambulatory Visit (HOSPITAL_COMMUNITY)
Admission: RE | Admit: 2016-10-11 | Discharge: 2016-10-11 | Disposition: A | Discharge status: Home / Self Care | Payer: Self-pay | Source: Ambulatory Visit | Attending: Internal Medicine | Admitting: Internal Medicine

## 2016-10-11 VITALS — BP 138/80 | HR 56 | Wt 223.2 lb

## 2016-10-11 DIAGNOSIS — I10 Essential (primary) hypertension: Secondary | ICD-10-CM

## 2016-10-11 DIAGNOSIS — R51 Headache: Secondary | ICD-10-CM | POA: Insufficient documentation

## 2016-10-11 DIAGNOSIS — I5022 Chronic systolic (congestive) heart failure: Secondary | ICD-10-CM

## 2016-10-11 DIAGNOSIS — I110 Hypertensive heart disease with heart failure: Secondary | ICD-10-CM | POA: Insufficient documentation

## 2016-10-11 DIAGNOSIS — I493 Ventricular premature depolarization: Secondary | ICD-10-CM | POA: Insufficient documentation

## 2016-10-11 DIAGNOSIS — Z7982 Long term (current) use of aspirin: Secondary | ICD-10-CM | POA: Insufficient documentation

## 2016-10-11 DIAGNOSIS — I429 Cardiomyopathy, unspecified: Secondary | ICD-10-CM | POA: Insufficient documentation

## 2016-10-11 LAB — COMPREHENSIVE METABOLIC PANEL
ALBUMIN: 4 g/dL (ref 3.5–5.0)
ALT: 28 U/L (ref 14–54)
ANION GAP: 6 (ref 5–15)
AST: 32 U/L (ref 15–41)
Alkaline Phosphatase: 50 U/L (ref 38–126)
BUN: 21 mg/dL — AB (ref 6–20)
CHLORIDE: 101 mmol/L (ref 101–111)
CO2: 28 mmol/L (ref 22–32)
Calcium: 9.6 mg/dL (ref 8.9–10.3)
Creatinine, Ser: 1.44 mg/dL — ABNORMAL HIGH (ref 0.44–1.00)
GFR calc Af Amer: 51 mL/min — ABNORMAL LOW (ref >60)
GFR, EST NON AFRICAN AMERICAN: 44 mL/min — AB (ref >60)
Glucose, Bld: 100 mg/dL — ABNORMAL HIGH (ref 65–99)
POTASSIUM: 4.6 mmol/L (ref 3.5–5.1)
Sodium: 135 mmol/L (ref 135–145)
Total Bilirubin: 0.9 mg/dL (ref 0.3–1.2)
Total Protein: 7.5 g/dL (ref 6.5–8.1)

## 2016-10-11 MED ORDER — AMIODARONE HCL 200 MG PO TABS
200.0000 mg | ORAL_TABLET | Freq: Every day | ORAL | 5 refills | Status: DC
Start: 2016-10-11 — End: 2016-11-22

## 2016-10-11 NOTE — Patient Instructions (Signed)
Decrease Amiodarone to 200 mg daily  Lab today  Your physician recommends that you schedule a follow-up appointment in: 6 weeks

## 2016-10-11 NOTE — Progress Notes (Signed)
PCP: None  Primary Cardiologist: Dr Gala RomneyBensimhon   HPI Ms Alena BillsKasidin is a 40 year old with history of HTN ( was [place] 5 anti[place]N medicines in [place]  newly diagnosed systolic heart failure 05/2016, headaches, and frequent PVCs. Has been in [place] for 1 [place]. No insurance.   Admitted with increased dyspnea and HTN crisis on 06/12/16. She had not been taking any medications over the last last year. New acute systolic heart identified on ECHO. LVEF ~25. CMRI no infiltrative/inflamatory process and EF ~25%. Had high PVC burden so she was started on amio. TSH was normal. Unable to obtain Entresto because she is not [place] citizen. Discharge weight 225 pounds.   Today she presents for HF follow up with paramedicine. Last visit HCTZ was added. SBP at home 120s.  BP at home verified with Para medicine.  Overall feeling good. Denies SOB/PND/Orthopnea. Taking all medications. Followed by Paramedicine biweekly. Weight at home from paramedicine 221-223 pounds. Walking in the apartment without difficulty. (walks for 1 mile in front of TV)   Echo today reviewed personally EF 55%  Labs 06/22/2016: K 5.1 Creatinine 1.15  Labs 06/29/2016: K 5.1 Creatinine 1.29    ROS: All systems negative except as listed in HPI, PMH and Problem List.  SH:  Social History   Social History  . Marital status: Married    Spouse name: N/A  . Number of children: N/A  . Years of education: N/A   Occupational History  . Not on file.   Social History Main Topics  . Smoking status: Never Smoker  . Smokeless tobacco: Never Used  . Alcohol use No  . Drug use: No  . Sexual activity: Not on file   Other Topics Concern  . Not on file   Social History Narrative  . No narrative on file    FH: No family history on file.  No past medical history on file.  Current Outpatient Prescriptions  Medication Sig Dispense Refill  . amiodarone (PACERONE) 200 MG tablet Take 1 tablet (200 mg total) by mouth 2 (two) times daily. 60 tablet 5  .  amLODipine (NORVASC) 10 MG tablet Take 1 tablet (10 mg total) by mouth daily. 30 tablet 6  . aspirin EC 81 MG EC tablet Take 1 tablet (81 mg total) by mouth daily.    . carvedilol (COREG) 6.25 MG tablet Take 1.5 tablets (9.375 mg total) by mouth 2 (two) times daily with a meal. 90 tablet 3  . hydrALAZINE (APRESOLINE) 100 MG tablet Take 1 tablet (100 mg total) by mouth 3 (three) times daily. 90 tablet 3  . hydrochlorothiazide (HYDRODIURIL) 25 MG tablet Take 1 tablet (25 mg total) by mouth daily. 30 tablet 3  . losartan (COZAAR) 100 MG tablet Take 0.5-1 tablets (50-100 mg total) by mouth as directed. Take 100mg   (1 tab) in the AM and 50mg  (one half tab) in the PM 45 tablet 5  . spironolactone (ALDACTONE) 25 MG tablet Take 1 tablet (25 mg total) by mouth daily. 30 tablet 2  . acetaminophen (TYLENOL) 500 MG tablet Take 500 mg by mouth every 6 (six) hours as needed for mild pain or headache.     No current facility-administered medications for this encounter.     Vitals:   10/11/16 0958  BP: 138/80  Pulse: (!) 56  SpO2: 99%  Weight: 223 lb 4 oz (101.3 kg)   PHYSICAL EXAM: General:  Well appearing. No resp difficulty. Ambulated in the clinic without difficulty.  [name]  Paramedic present.  HEENT: normal Neck: supple. JVP  5-6  . Carotids 2+ bilaterally; no bruits. No lymphadenopathy or thryomegaly appreciated. Cor: PMI normal. Irregular. Regular rate & rhythm. No rubs, gallops or murmurs. Lungs: clear Abdomen: obese, soft, nontender, nondistended. No hepatosplenomegaly. No bruits or masses. Good bowel sounds. Extremities: no cyanosis, clubbing, rash, edema Neuro: alert & orientedx3, cranial nerves grossly intact. Moves all 4 extremities w/o difficulty. Affect pleasant.   ASSESSMENT & PLAN: 1. Chronic Systolic Heart Failure- NICM- ECHO 05/2016  LVEF ~25-30%. CMRI no infiltrative/inflamatory process and EF ~25%.  Echo today EF 55%. ? HTN CM vs PVC induced NYHA II. Volume status stable. Continue  25 mg spiro daily.  Continue carvedilol to 9.375 mg wice a day.   Continue losartan 100 mg daily and 50 mg losartan in the pm. Unable to switch to entresto as she is not a [place] citizen.  Continue hydralazine 100 mg three times a day.  Medications provided from the HF fund. Reinforced daily weights, low salty food choices, and limiting fluid intake to < 2 liters daily.  2. HTN- BP elevated here but at home SBP 120s at home. This was verified with manual BP by Paramedic.      3. Headache- resolved.   4. PVCs-While in hospital had 16% PVCs in 8/17. Amio added. PVCs now suppressed. may be reason heart recovered. Decrease amio 200 daily. At next visit take to 100 daily and eventually wean off. If PVCs return may need referral for ablation. Check labs today. Denies OSA symptoms.   Continue [facility]. Paramedic, [name] present for visit.     Luiza Carranco NP-C  10:47 AM

## 2016-10-11 NOTE — Progress Notes (Signed)
  Echocardiogram 2D Echocardiogram has been performed.  Mandy Serrano 10/11/2016, 10:01 AM

## 2016-10-11 NOTE — Addendum Note (Signed)
Encounter addended by: Noralee Space, RN on: 10/11/2016 11:08 AM<BR>    Actions taken: Order list changed, Diagnosis association updated, Sign clinical note

## 2016-10-17 ENCOUNTER — Other Ambulatory Visit: Payer: Self-pay | Admitting: Family Medicine

## 2016-10-18 ENCOUNTER — Telehealth (HOSPITAL_COMMUNITY): Payer: Self-pay | Admitting: *Deleted

## 2016-10-18 MED ORDER — ASPIRIN 81 MG PO TBEC: 81.0000 mg | DELAYED_RELEASE_TABLET | Freq: Every day | ORAL | 3 refills | Status: DC

## 2016-10-18 MED FILL — ASPIR-LOW EC 81 MG TABLET: 81 | 30 days supply | Qty: 30 | Fill #0

## 2016-10-18 MED FILL — LOSARTAN POTASSIUM 100 MG T: 100 | 30 days supply | Qty: 45 | Fill #1

## 2016-10-18 MED FILL — CARVEDILOL 6.25 MG TABLET: 6.25 | 30 days supply | Qty: 90 | Fill #1

## 2016-10-18 NOTE — Telephone Encounter (Signed)
Mandy Serrano called saying patient needed prescription for her aspirin.  Prescription sent to pharmacy as requested.

## 2016-10-25 MED FILL — SPIRONOLACTONE 25 MG TABLET: 25 | 34 days supply | Qty: 34 | Fill #2

## 2016-10-25 MED FILL — HYDROCHLOROTHIAZIDE 25 MG T: 25 | 30 days supply | Qty: 30 | Fill #2

## 2016-11-15 MED FILL — ASPIR-LOW EC 81 MG TABLET: 81 | 30 days supply | Qty: 30 | Fill #1

## 2016-11-15 MED FILL — AMLODIPINE BESYLATE 10 MG T: 10 | 30 days supply | Qty: 30 | Fill #3

## 2016-11-22 ENCOUNTER — Ambulatory Visit (HOSPITAL_COMMUNITY)
Admission: RE | Admit: 2016-11-22 | Discharge: 2016-11-22 | Disposition: A | Discharge status: Home / Self Care | Payer: No Typology Code available for payment source | Source: Ambulatory Visit | Attending: Internal Medicine | Admitting: Internal Medicine

## 2016-11-22 VITALS — BP 146/90 | HR 75 | Wt 223.2 lb

## 2016-11-22 DIAGNOSIS — I5022 Chronic systolic (congestive) heart failure: Secondary | ICD-10-CM

## 2016-11-22 DIAGNOSIS — I429 Cardiomyopathy, unspecified: Secondary | ICD-10-CM | POA: Insufficient documentation

## 2016-11-22 DIAGNOSIS — I110 Hypertensive heart disease with heart failure: Secondary | ICD-10-CM | POA: Insufficient documentation

## 2016-11-22 DIAGNOSIS — I493 Ventricular premature depolarization: Secondary | ICD-10-CM

## 2016-11-22 DIAGNOSIS — Z7982 Long term (current) use of aspirin: Secondary | ICD-10-CM | POA: Insufficient documentation

## 2016-11-22 DIAGNOSIS — I10 Essential (primary) hypertension: Secondary | ICD-10-CM

## 2016-11-22 MED ORDER — AMIODARONE HCL 100 MG PO TABS: 100.0000 mg | ORAL_TABLET | Freq: Every day | ORAL | 6 refills | Status: DC

## 2016-11-22 MED FILL — AMIODARONE HCL 200 MG TAB: 200 | 30 days supply | Qty: 15 | Fill #0

## 2016-11-22 NOTE — Progress Notes (Signed)
PCP: None  Primary Cardiologist: Dr Gala Romney   HPI Mandy Serrano is a 41 year old with history of HTN ( was on 5 anti-HTN medicines in [place]),  newly diagnosed systolic heart failure 05/2016, headaches, and frequent PVCs. Has been in [place] for 1 year. No insurance.   Admitted with increased dyspnea and HTN crisis on 06/12/16. She had not been taking any medications over the last last year. New acute systolic heart identified on ECHO. LVEF ~25. CMRI no infiltrative/inflamatory process and EF ~25%. Had high PVC burden so she was started on amio. TSH was normal. Unable to obtain Entresto because she is not [place] citizen. Discharge weight 225 pounds.   Today she presents for HF follow up. Overall feeling ok. Complaining of loose tooth causing pain. Needs an extraction. Plans for extraction at [facility]. Denies SOB/PND/Orthopnea. Followed by Paramedicine biweekly. Weight at home from paramedicine 221-223 pounds. Walking in the apartment without difficulty. (walks for 1 mile in front of TV)   Echo 09/2016 EF 55%  Labs 06/22/2016: K 5.1 Creatinine 1.15  Labs 06/29/2016: K 5.1 Creatinine 1.29    ROS: All systems negative except as listed in HPI, PMH and Problem List.  SH:  Social History   Social History  . Marital status: Married    Spouse name: N/A  . Number of children: N/A  . Years of education: N/A   Occupational History  . Not on file.   Social History Main Topics  . Smoking status: Never Smoker  . Smokeless tobacco: Never Used  . Alcohol use No  . Drug use: No  . Sexual activity: Not on file   Other Topics Concern  . Not on file   Social History Narrative  . No narrative on file    FH: No family history on file.  No past medical history on file.  Current Outpatient Prescriptions  Medication Sig Dispense Refill  . acetaminophen (TYLENOL) 500 MG tablet Take 500 mg by mouth every 6 (six) hours as needed for mild pain or headache.    Marland Kitchen amiodarone (PACERONE) 200 MG tablet Take 1  tablet (200 mg total) by mouth daily. 60 tablet 5  . amLODipine (NORVASC) 10 MG tablet Take 1 tablet (10 mg total) by mouth daily. 30 tablet 6  . aspirin 81 MG EC tablet Take 1 tablet (81 mg total) by mouth daily. 30 tablet 3  . carvedilol (COREG) 6.25 MG tablet Take 1.5 tablets (9.375 mg total) by mouth 2 (two) times daily with a meal. 90 tablet 3  . hydrALAZINE (APRESOLINE) 100 MG tablet Take 1 tablet (100 mg total) by mouth 3 (three) times daily. 90 tablet 3  . hydrochlorothiazide (HYDRODIURIL) 25 MG tablet Take 1 tablet (25 mg total) by mouth daily. 30 tablet 3  . losartan (COZAAR) 100 MG tablet Take 0.5-1 tablets (50-100 mg total) by mouth as directed. Take 100mg   (1 tab) in the AM and 50mg  (one half tab) in the PM 45 tablet 5  . spironolactone (ALDACTONE) 25 MG tablet Take 1 tablet (25 mg total) by mouth daily. 30 tablet 2   No current facility-administered medications for this encounter.     Vitals:   11/22/16 1410  BP: (!) 146/90  Pulse: 75  SpO2: 98%  Weight: 223 lb 3.2 oz (101.2 kg)   PHYSICAL EXAM: General:  NAD. Walked in the clinic.  [name] Paramedic present.  HEENT: normal Neck: supple. JVP  5-6  . Carotids 2+ bilaterally; no bruits. No lymphadenopathy or  thryomegaly appreciated. Cor: PMI normal. Irregular. Regular rate & rhythm. No rubs, gallops or murmurs. Lungs: clear Abdomen: obese, soft, nontender, nondistended. No hepatosplenomegaly. No bruits or masses. Good bowel sounds. Extremities: no cyanosis, clubbing, rash, edema Neuro: alert & orientedx3, cranial nerves grossly intact. Moves all 4 extremities w/o difficulty. Affect pleasant.   ASSESSMENT & PLAN: 1. Chronic Systolic Heart Failure- NICM- ECHO 05/2016  LVEF ~25-30%. CMRI no infiltrative/inflamatory process and EF ~25%.  EF recovery noted 09/2017 with EF up to 55%. NICM HTN  vs PVC induced NYHA I. Volume status stable. Continue 25 mg spiro daily.  Continue current dose of bb-->arvedilol to 9.375 mg wice a day.     Continue current dose of ARB-->losartan 100 mg daily and 50 mg losartan in the pm. Unable to switch to entresto as she is not a [place] citizen.  Continue current dose of hydralazine--->100 mg three times a day.  Medications provided from the HF fund. Reinforced daily weights, low salty food choices, and limiting fluid intake to < 2 liters daily.  2. HTN- slightly  elevated bp at home.  3. Headache- resolved.   4. PVCs-While in hospital had 16% PVCs in 8/17. Amio added. PVCs now suppressed. may be reason heart recovered. . Cut back amio to 100 mg daily. She understands she will need yearly eye exams.   Continue [facility] for [facility]. Paramedic, Geraldine Contras present for visit.     Follow up in 3-4 months.    Amy Clegg NP-C  2:15 PM

## 2016-11-22 NOTE — Patient Instructions (Signed)
DECREASE Amiodarone 100 mg, one tab daily  .Your physician recommends that you schedule a follow-up appointment in: 3 months with Amy Clegg,NP  Do the following things EVERYDAY: 1) Weigh yourself in the morning before breakfast. Write it down and keep it in a log. 2) Take your medicines as prescribed 3) Eat low salt foods-Limit salt (sodium) to 2000 mg per day.  4) Stay as active as you can everyday 5) Limit all fluids for the day to less than 2 liters

## 2016-11-27 ENCOUNTER — Ambulatory Visit (INDEPENDENT_AMBULATORY_CARE_PROVIDER_SITE_OTHER): Payer: No Typology Code available for payment source | Admitting: Family Medicine

## 2016-11-27 ENCOUNTER — Encounter: Payer: Self-pay | Admitting: Family Medicine

## 2016-11-27 VITALS — BP 152/87 | HR 71 | Temp 98.4°F | Resp 18 | Ht 65.0 in | Wt 225.8 lb

## 2016-11-27 DIAGNOSIS — K047 Periapical abscess without sinus: Secondary | ICD-10-CM

## 2016-11-27 MED ORDER — AMOXICILLIN 500 MG PO CAPS: 500.0000 mg | ORAL_CAPSULE | Freq: Three times a day (TID) | ORAL | 0 refills | Status: DC

## 2016-11-27 MED ORDER — TRAMADOL HCL 50 MG PO TABS: 50.0000 mg | ORAL_TABLET | Freq: Three times a day (TID) | ORAL | 0 refills | Status: DC | PRN

## 2016-11-27 MED FILL — AMOXICILLIN 500 MG CAPSULE: 500 | 10 days supply | Qty: 30 | Fill #0

## 2016-11-27 MED FILL — traMADol HCL 50 MG TABS: 50 | 10 days supply | Qty: 30 | Fill #0

## 2016-11-27 NOTE — Patient Instructions (Addendum)
Someone will call you with dental appointment. Take medications as prescribed for infection and pain.

## 2016-11-27 NOTE — Progress Notes (Signed)
Mandy Serrano, is a 41 y.o. female  BBC:[id number]  HWT:[id number]  DOB - 06-21-1976  CC:  Chief Complaint  Patient presents with  . Referral    needs referral for denitist       HPI: Mandy Serrano is a 41 y.o. female here for acute visit for dental pain. She reports 10/10 pain intermittently for about a week. The pain is on the upper left. She reports soreness of the gum around the tooth. She request a dental referral on her orange card  No Known Allergies No past medical history on file. Current Outpatient Prescriptions on File Prior to Visit  Medication Sig Dispense Refill  . amiodarone (PACERONE) 100 MG tablet Take 1 tablet (100 mg total) by mouth daily. 30 tablet 6  . amLODipine (NORVASC) 10 MG tablet Take 1 tablet (10 mg total) by mouth daily. 30 tablet 6  . aspirin 81 MG EC tablet Take 1 tablet (81 mg total) by mouth daily. 30 tablet 3  . carvedilol (COREG) 6.25 MG tablet Take 1.5 tablets (9.375 mg total) by mouth 2 (two) times daily with a meal. 90 tablet 3  . hydrALAZINE (APRESOLINE) 100 MG tablet Take 1 tablet (100 mg total) by mouth 3 (three) times daily. 90 tablet 3  . hydrochlorothiazide (HYDRODIURIL) 25 MG tablet Take 1 tablet (25 mg total) by mouth daily. 30 tablet 3  . losartan (COZAAR) 100 MG tablet Take 0.5-1 tablets (50-100 mg total) by mouth as directed. Take 100mg   (1 tab) in the AM and 50mg  (one half tab) in the PM 45 tablet 5  . spironolactone (ALDACTONE) 25 MG tablet Take 1 tablet (25 mg total) by mouth daily. 30 tablet 2  . acetaminophen (TYLENOL) 500 MG tablet Take 500 mg by mouth every 6 (six) hours as needed for mild pain or headache.     No current facility-administered medications on file prior to visit.    No family history on file. Social History   Social History  . Marital status: Married    Spouse name: N/A  . Number of children: N/A  . Years of education: N/A   Occupational History  . Not on file.   Social History Main Topics  .  Smoking status: Never Smoker  . Smokeless tobacco: Never Used  . Alcohol use No  . Drug use: No  . Sexual activity: Not on file   Other Topics Concern  . Not on file   Social History Narrative  . No narrative on file    Review of Systems: Constitutional: Negative Skin: Negative HENT: + dental pain   Objective:   Vitals:   11/27/16 0831  BP: (!) 152/87  Pulse: 71  Resp: 18  Temp: 98.4 F (36.9 C)    PE There is soft tissue swelling and redness on the upper left. I do not see a distinct cavity.    Lab Results  Component Value Date   WBC 7.8 06/16/2016   HGB 13.4 06/16/2016   HCT 42.7 06/16/2016   MCV 94.3 06/16/2016   PLT 282 06/16/2016   Lab Results  Component Value Date   CREATININE 1.44 (H) 10/11/2016   BUN 21 (H) 10/11/2016   NA 135 10/11/2016   K 4.6 10/11/2016   CL 101 10/11/2016   CO2 28 10/11/2016    Lab Results  Component Value Date   HGBA1C 5.5 09/19/2016   Lipid Panel     Component Value Date/Time   CHOL 223 (H) 09/19/2016 1436  TRIG 104 09/19/2016 1436   HDL 51 09/19/2016 1436   CHOLHDL 4.4 09/19/2016 1436   VLDL 21 09/19/2016 1436   LDLCALC 151 (H) 09/19/2016 1436        Assessment and plan:   1. Dental abscess  - Ambulatory referral to Dentistry -Amoxicillin 500 mg #30, one po tid x 10 days -Tramadol 50 mg #30, one po q 8 hours prn pain.   Return if symptoms worsen or fail to improve.  The patient was given clear instructions to go to ER or return to medical center if symptoms don't improve, worsen or new problems develop. The patient verbalized understanding.    Henrietta Hoover FNP  11/27/2016, 9:07 AM

## 2016-11-29 MED FILL — hydrALAZINE HCL 100 MG TABS: 100 | 30 days supply | Qty: 90 | Fill #2

## 2016-11-29 MED FILL — HYDROCHLOROTHIAZIDE 25 MG T: 25 | 30 days supply | Qty: 30 | Fill #3

## 2016-11-29 MED FILL — CARVEDILOL 6.25 MG TABLET: 6.25 | 30 days supply | Qty: 90 | Fill #2

## 2016-11-29 MED FILL — LOSARTAN POTASSIUM 100 MG T: 100 | 30 days supply | Qty: 45 | Fill #2

## 2016-11-29 MED FILL — SPIRONOLACTONE 25 MG TABLET: 25 | 30 days supply | Qty: 30 | Fill #1

## 2016-12-12 ENCOUNTER — Encounter: Payer: Self-pay | Admitting: Family Medicine

## 2016-12-12 ENCOUNTER — Ambulatory Visit (INDEPENDENT_AMBULATORY_CARE_PROVIDER_SITE_OTHER): Payer: No Typology Code available for payment source | Admitting: Family Medicine

## 2016-12-12 VITALS — BP 144/85 | HR 83 | Temp 98.3°F | Resp 18 | Wt 226.6 lb

## 2016-12-12 DIAGNOSIS — R319 Hematuria, unspecified: Secondary | ICD-10-CM

## 2016-12-12 DIAGNOSIS — N390 Urinary tract infection, site not specified: Secondary | ICD-10-CM

## 2016-12-12 DIAGNOSIS — R1084 Generalized abdominal pain: Secondary | ICD-10-CM

## 2016-12-12 DIAGNOSIS — R300 Dysuria: Secondary | ICD-10-CM

## 2016-12-12 DIAGNOSIS — R350 Frequency of micturition: Secondary | ICD-10-CM

## 2016-12-12 LAB — BASIC METABOLIC PANEL
BUN: 19 mg/dL (ref 7–25)
CALCIUM: 9.9 mg/dL (ref 8.6–10.2)
CO2: 27 mmol/L (ref 20–31)
Chloride: 98 mmol/L (ref 98–110)
Creat: 1.22 mg/dL — ABNORMAL HIGH (ref 0.50–1.10)
GLUCOSE: 92 mg/dL (ref 65–99)
POTASSIUM: 4 mmol/L (ref 3.5–5.3)
SODIUM: 134 mmol/L — AB (ref 135–146)

## 2016-12-12 LAB — POCT URINALYSIS DIP (DEVICE)
Bilirubin Urine: NEGATIVE
Glucose, UA: NEGATIVE mg/dL
KETONES UR: NEGATIVE mg/dL
Nitrite: NEGATIVE
PROTEIN: NEGATIVE mg/dL
SPECIFIC GRAVITY, URINE: 1.01 (ref 1.005–1.030)
UROBILINOGEN UA: 0.2 mg/dL (ref 0.0–1.0)
pH: 6.5 (ref 5.0–8.0)

## 2016-12-12 LAB — POCT URINE PREGNANCY: Preg Test, Ur: NEGATIVE

## 2016-12-12 MED ORDER — CIPROFLOXACIN HCL 250 MG PO TABS: 250.0000 mg | ORAL_TABLET | Freq: Two times a day (BID) | ORAL | 0 refills | Status: DC

## 2016-12-12 MED ORDER — SULFAMETHOXAZOLE-TRIMETHOPRIM 800-160 MG PO TABS: 1.0000 | ORAL_TABLET | Freq: Two times a day (BID) | ORAL | 0 refills | Status: AC

## 2016-12-12 MED FILL — ASPIR-LOW EC 81 MG TABLET: 81 | 30 days supply | Qty: 30 | Fill #2

## 2016-12-12 MED FILL — SULFAMETHOXAZOLE/TMP DS TAB: 800-160 | 7 days supply | Qty: 14 | Fill #0

## 2016-12-12 NOTE — Progress Notes (Signed)
   Subjective:    Patient ID: Mandy Serrano, female    DOB: [birth year], 41 y.o.   MRN: [id number]  Dysuria   The current episode started yesterday. The problem occurs intermittently. The pain is at a severity of 6/10. The pain is moderate. There has been no fever. She is not sexually active. There is no history of pyelonephritis. Associated symptoms include flank pain and hematuria. Pertinent negatives include no nausea. She has tried nothing for the symptoms. The treatment provided no relief. There is no history of recurrent UTIs, a single kidney, urinary stasis or a urological procedure.   Past Medical History:  Diagnosis Date  . Malignant hypertension    Social History   Social History  . Marital status: Married    Spouse name: N/A  . Number of children: N/A  . Years of education: N/A   Occupational History  . Not on file.   Social History Main Topics  . Smoking status: Never Smoker  . Smokeless tobacco: Never Used  . Alcohol use No  . Drug use: No  . Sexual activity: Not on file   Other Topics Concern  . Not on file   Social History Narrative  . No narrative on file   Review of Systems  Constitutional: Negative.   HENT: Negative.   Respiratory: Negative.   Cardiovascular: Negative.   Gastrointestinal: Positive for abdominal pain. Negative for nausea.  Genitourinary: Positive for dysuria, flank pain and hematuria.  Skin: Negative.   Neurological: Negative.   Hematological: Negative.   Psychiatric/Behavioral: Negative.        Objective:   Physical Exam  Constitutional: She appears well-developed and well-nourished.  HENT:  Head: Normocephalic and atraumatic.  Right Ear: External ear normal.  Left Ear: External ear normal.  Nose: Nose normal.  Mouth/Throat: Oropharynx is clear and moist.  Eyes: Conjunctivae are normal. Pupils are equal, round, and reactive to light.  Neck: Normal range of motion.  Cardiovascular: Normal rate, regular rhythm and normal heart  sounds.   Pulmonary/Chest: Effort normal and breath sounds normal.  Abdominal: Bowel sounds are normal. There is tenderness.  Musculoskeletal: Normal range of motion.  Neurological: She is alert. She has normal reflexes.  Skin: Skin is warm and dry.      BP (!) 144/85 (BP Location: Right Arm, Patient Position: Sitting, Cuff Size: Large)   Pulse 83   Temp 98.3 F (36.8 C) (Oral)   Resp 18   Wt 226 lb 9.6 oz (102.8 kg)   LMP 12/05/2016   SpO2 100%   BMI 37.71 kg/m  Assessment & Plan:  1. Dysuria - Basic Metabolic Panel - Urine culture - Urinalysis Dipstick  2. Urinary frequency - Basic Metabolic Panel - Urine culture - Urinalysis Dipstick  3. Generalized abdominal pain - POCT urine pregnancy  4. Urinary tract infection with hematuria, site unspecified - POCT urinalysis dip (device) - sulfamethoxazole-trimethoprim (BACTRIM DS) 800-160 MG tablet; Take 1 tablet by mouth 2 (two) times daily.  Dispense: 14 tablet; Refill: 0 RTC: As needed.   Kasch Borquez M, FNP    The patient was given clear instructions to go to ER or return to medical center if symptoms do not improve, worsen or new problems develop. The patient verbalized understanding. Will notify patient with laboratory results.

## 2016-12-12 NOTE — Patient Instructions (Addendum)
Hematuria, Adult °Hematuria is blood in your urine. It can be caused by a bladder infection, kidney infection, prostate infection, kidney stone, or cancer of your urinary tract. Infections can usually be treated with medicine, and a kidney stone usually will pass through your urine. If neither of these is the cause of your hematuria, further workup to find out the reason may be needed. °It is very important that you tell your health care provider about any blood you see in your urine, even if the blood stops without treatment or happens without causing pain. Blood in your urine that happens and then stops and then happens again can be a symptom of a very serious condition. Also, pain is not a symptom in the initial stages of many urinary cancers. °Follow these instructions at home: °· Drink lots of fluid, 3-4 quarts a day. If you have been diagnosed with an infection, cranberry juice is especially recommended, in addition to large amounts of water. °· Avoid caffeine, tea, and carbonated beverages because they tend to irritate the bladder. °· Avoid alcohol because it may irritate the prostate. °· Take all medicines as directed by your health care provider. °· If you were prescribed an antibiotic medicine, finish it all even if you start to feel better. °· If you have been diagnosed with a kidney stone, follow your health care provider's instructions regarding straining your urine to catch the stone. °· Empty your bladder often. Avoid holding urine for long periods of time. °· After a bowel movement, women should cleanse front to back. Use each tissue only once. °· Empty your bladder before and after sexual intercourse if you are a female. °Contact a health care provider if: °· You develop back pain. °· You have a fever. °· You have a feeling of sickness in your stomach (nausea) or vomiting. °· Your symptoms are not better in 3 days. Return sooner if you are getting worse. °Get help right away if: °· You develop  severe vomiting and are unable to keep the medicine down. °· You develop severe back or abdominal pain despite taking your medicines. °· You begin passing a large amount of blood or clots in your urine. °· You feel extremely weak or faint, or you pass out. °This information is not intended to replace advice given to you by your health care provider. Make sure you discuss any questions you have with your health care provider. °Document Released: 10/09/2005 Document Revised: 03/16/2016 Document Reviewed: 06/09/2013 °Elsevier Interactive Patient Education © 2017 [employer] ° °Urinary Tract Infection, Adult °Introduction °A urinary tract infection (UTI) is an infection of any part of the urinary tract. The urinary tract includes the: °· Kidneys. °· Ureters. °· Bladder. °· Urethra. °These organs make, store, and get rid of pee (urine) in the body. °Follow these instructions at home: °· Take over-the-counter and prescription medicines only as told by your doctor. °· If you were prescribed an antibiotic medicine, take it as told by your doctor. Do not stop taking the antibiotic even if you start to feel better. °· Avoid the following drinks: °¨ Alcohol. °¨ Caffeine. °¨ Tea. °¨ Carbonated drinks. °· Drink enough fluid to keep your pee clear or pale yellow. °· Keep all follow-up visits as told by your doctor. This is important. °· Make sure to: °¨ Empty your bladder often and completely. Do not to hold pee for long periods of time. °¨ Empty your bladder before and after sex. °¨ Wipe from front to back after a   bowel movement if you are female. Use each tissue one time when you wipe. °Contact a doctor if: °· You have back pain. °· You have a fever. °· You feel sick to your stomach (nauseous). °· You throw up (vomit). °· Your symptoms do not get better after 3 days. °· Your symptoms go away and then come back. °Get help right away if: °· You have very bad back pain. °· You have very bad lower belly (abdominal) pain. °· You  are throwing up and cannot keep down any medicines or water. °This information is not intended to replace advice given to you by your health care provider. Make sure you discuss any questions you have with your health care provider. °Document Released: 03/27/2008 Document Revised: 03/16/2016 Document Reviewed: 08/30/2015 °© 2017 Elsevier ° °

## 2016-12-14 LAB — URINE CULTURE

## 2016-12-15 ENCOUNTER — Telehealth: Payer: Self-pay

## 2016-12-15 NOTE — Telephone Encounter (Signed)
Called, no answer. Left message for patient to return call.

## 2016-12-15 NOTE — Telephone Encounter (Signed)
-----   Message from Massie Maroon, [place] sent at 12/15/2016 12:55 PM EST ----- Regarding: lab results Patient has ecoli growing in urine, please remind her to complete antibiotic as prescribed. She can follow up in clinic as needed.   Thanks ----- Message ----- From: Massie Maroon, FNP Sent: 12/12/2016   3:30 PM To: Massie Maroon, FNP

## 2016-12-18 NOTE — Telephone Encounter (Signed)
CALLED, NO ANSWER. LEFT MESSAGE FOR PATIENT TO RETURN CALL. THANKS!

## 2016-12-19 NOTE — Telephone Encounter (Signed)
I have tried multiple times to contact patient by phone with no success. Will mail letter advising of results and instructions. Thanks!

## 2016-12-21 ENCOUNTER — Other Ambulatory Visit (HOSPITAL_COMMUNITY): Payer: Self-pay

## 2016-12-21 NOTE — Progress Notes (Signed)
Paramedicine Encounter    Patient ID: Mandy Serrano, female    DOB: 1976-07-05, 41 y.o.   MRN: [id number]  Patient Care Team: Henrietta Hoover, NP as PCP - General (Family Medicine)  Patient Active Problem List   Diagnosis Date Noted  . Hypothermia of newborn 09/20/2016  . Frequent PVCs   . Acute pulmonary edema (HCC) 06/14/2016  . Acute systolic CHF (congestive heart failure) (HCC) 06/14/2016  . Malignant hypertension 06/12/2016    Current Outpatient Prescriptions:  .  acetaminophen (TYLENOL) 500 MG tablet, Take 500 mg by mouth every 6 (six) hours as needed for mild pain or headache., Disp: , Rfl:  .  amiodarone (PACERONE) 100 MG tablet, Take 1 tablet (100 mg total) by mouth daily., Disp: 30 tablet, Rfl: 6 .  amLODipine (NORVASC) 10 MG tablet, Take 1 tablet (10 mg total) by mouth daily., Disp: 30 tablet, Rfl: 6 .  amoxicillin (AMOXIL) 500 MG capsule, Take 1 capsule (500 mg total) by mouth 3 (three) times daily. (Patient not taking: Reported on 12/12/2016), Disp: 30 capsule, Rfl: 0 .  aspirin 81 MG EC tablet, Take 1 tablet (81 mg total) by mouth daily., Disp: 30 tablet, Rfl: 3 .  carvedilol (COREG) 6.25 MG tablet, Take 1.5 tablets (9.375 mg total) by mouth 2 (two) times daily with a meal., Disp: 90 tablet, Rfl: 3 .  hydrALAZINE (APRESOLINE) 100 MG tablet, Take 1 tablet (100 mg total) by mouth 3 (three) times daily., Disp: 90 tablet, Rfl: 3 .  hydrochlorothiazide (HYDRODIURIL) 25 MG tablet, Take 1 tablet (25 mg total) by mouth daily., Disp: 30 tablet, Rfl: 3 .  losartan (COZAAR) 100 MG tablet, Take 0.5-1 tablets (50-100 mg total) by mouth as directed. Take 100mg   (1 tab) in the AM and 50mg  (one half tab) in the PM, Disp: 45 tablet, Rfl: 5 .  spironolactone (ALDACTONE) 25 MG tablet, Take 1 tablet (25 mg total) by mouth daily., Disp: 30 tablet, Rfl: 2 .  traMADol (ULTRAM) 50 MG tablet, Take 1 tablet (50 mg total) by mouth every 8 (eight) hours as needed. (Patient not taking: Reported on  12/12/2016), Disp: 30 tablet, Rfl: 0 No Known Allergies   Social History   Social History  . Marital status: Married    Spouse name: N/A  . Number of children: N/A  . Years of education: N/A   Occupational History  . Not on file.   Social History Main Topics  . Smoking status: Never Smoker  . Smokeless tobacco: Never Used  . Alcohol use No  . Drug use: No  . Sexual activity: Not on file   Other Topics Concern  . Not on file   Social History Narrative  . No narrative on file    Physical Exam  Eyes: Pupils are equal, round, and reactive to light.  Pulmonary/Chest: No respiratory distress. She has no wheezes. She has no rales.  Abdominal: She exhibits no distension. There is no tenderness. There is no guarding.  Musculoskeletal: She exhibits no edema.  Skin: Skin is warm and dry. She is not diaphoretic.        Future Appointments Date Time Provider Department Center  12/25/2016 3:30 PM Henrietta Hoover, NP [facility] None  02/15/2017 11:30 AM [facility] PA/NP [facility] None   ATF pt CAO x4 only c/o tooth ache. Pt has talked with her primary physician about the same several times.  Pt has an appointment with her primary physician Monday for a referral.  Pt has no other complaints  today, no sob, no dizziness, no headaches, and no chest pain.  This is pt last CHP due to pt b/p and weight has been regulated.  Pt is currently filling her own pill box without difficulty.  Pt had questions about how to call in her refills; CHP explained to pt how to call in the refills.  Pt understands the zones and the low sodium diet.   CHP advised pt to call if she has any issues or questions.   BP 132/82 (BP Location: Right Arm, Patient Position: Sitting, Cuff Size: Large)   Pulse 60   Wt 224 lb (101.6 kg)   LMP 12/05/2016   SpO2 99%   BMI 37.28 kg/m   Weight yesterday-224 Last visit weight-224    ACTION: Home visit completed

## 2016-12-24 IMAGING — US US RENAL
1 series · 14 of 25 positions shown · non-contrast
Comparison: None in PACs

CLINICAL DATA: Renal failure, hypertensive crisis on June 12, 2016, systolic heart failure.

EXAM:
RENAL / URINARY TRACT ULTRASOUND COMPLETE

[Series 1: us renal · 0.23mm/px · 14 of 30 slices shown]
[im 1/30]
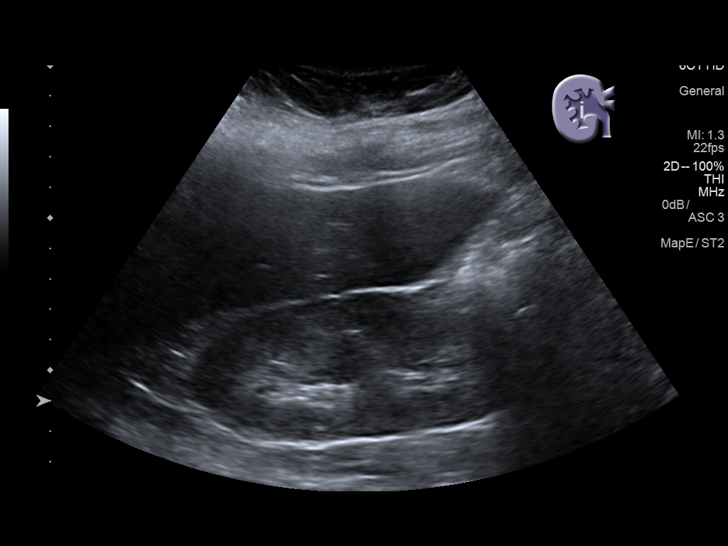
[im 3/30]
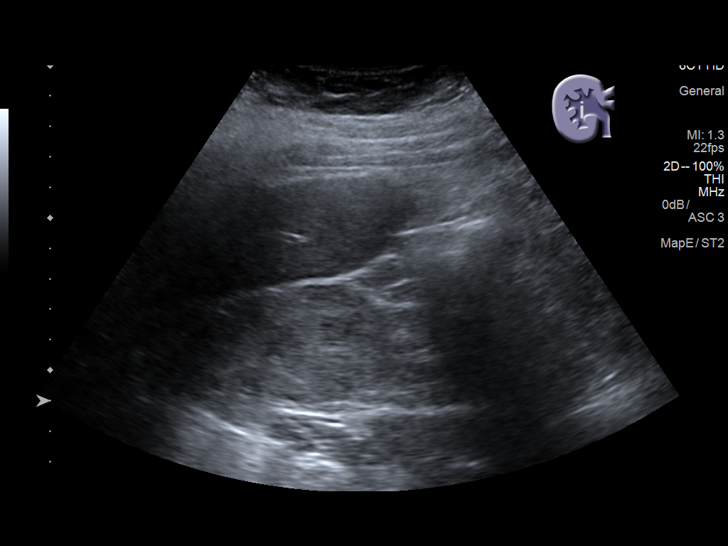
[im 5/30]
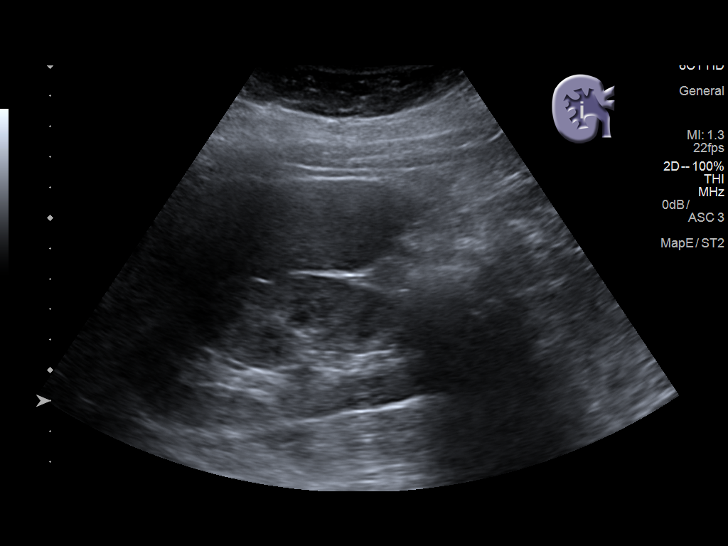
[im 8/30]
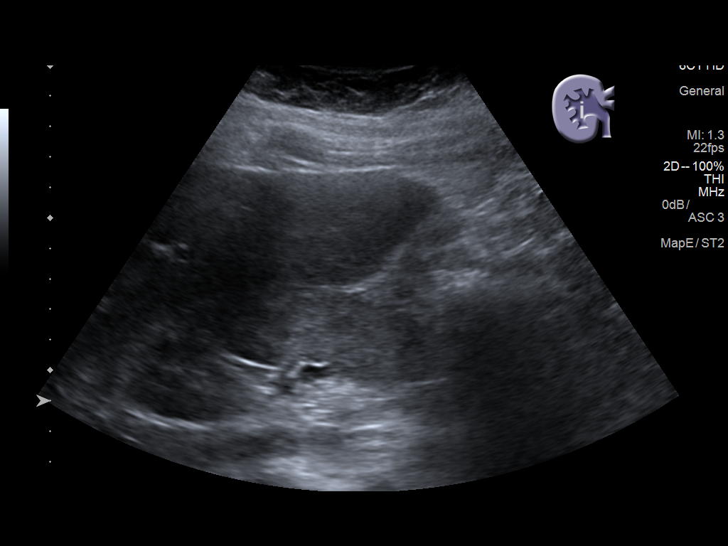
[im 10/30]
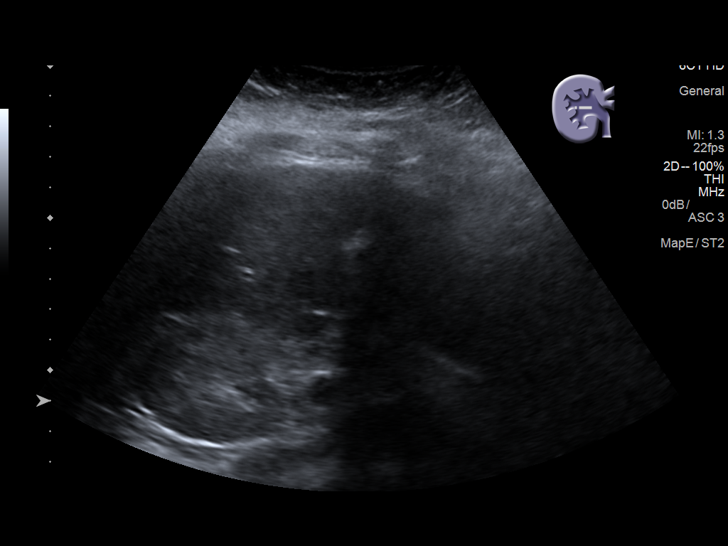
[im 11/30]
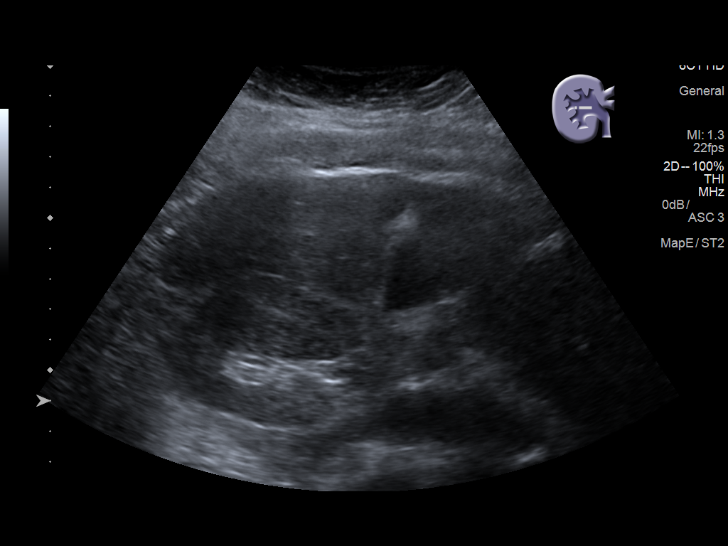
[im 14/30]
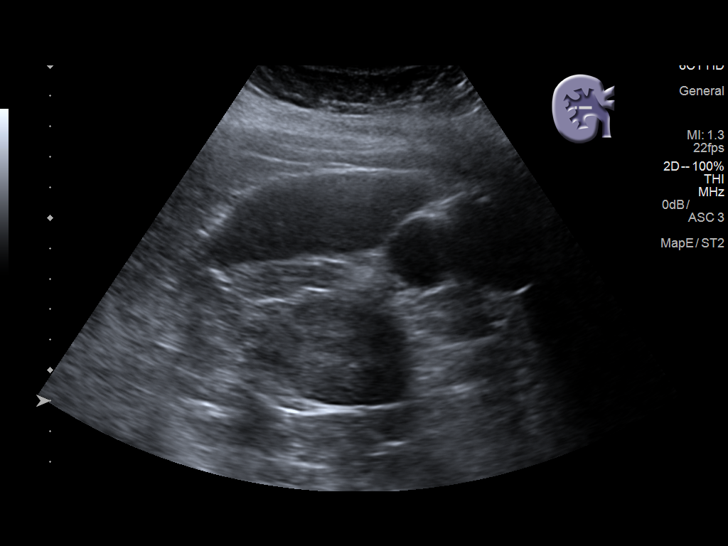
[im 16/30]
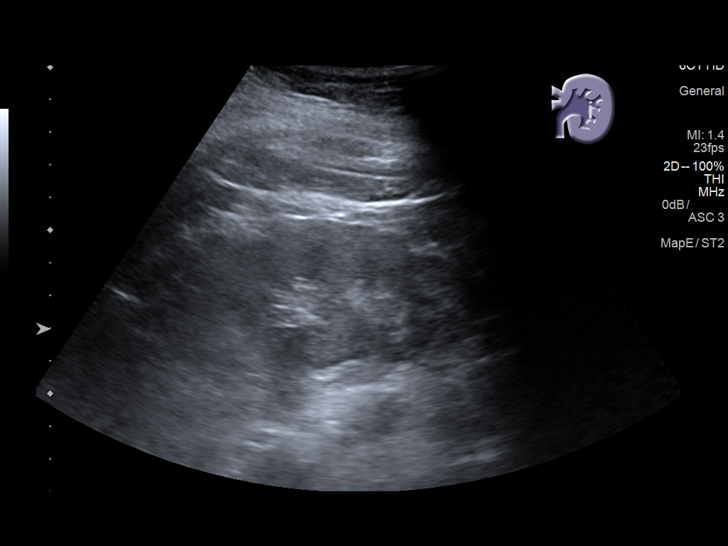
[im 19/30]
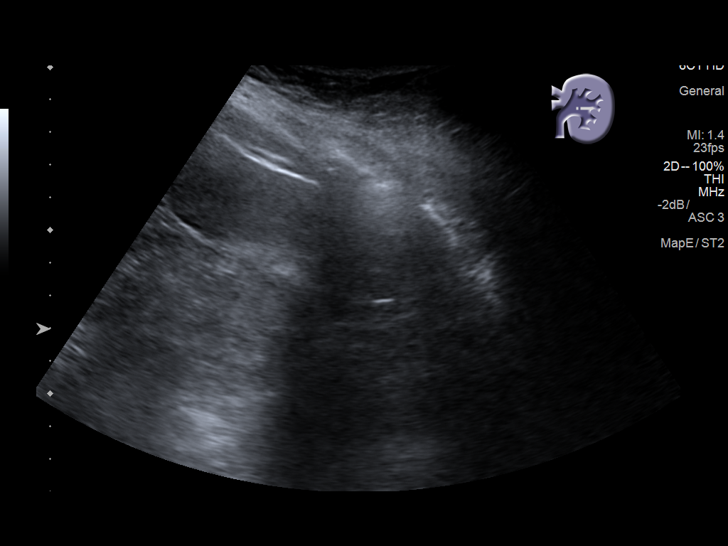
[im 20/30]
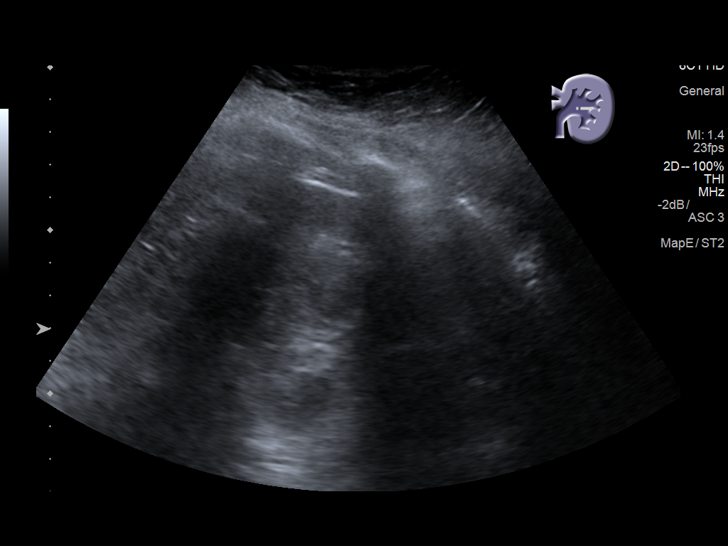
[im 22/30]
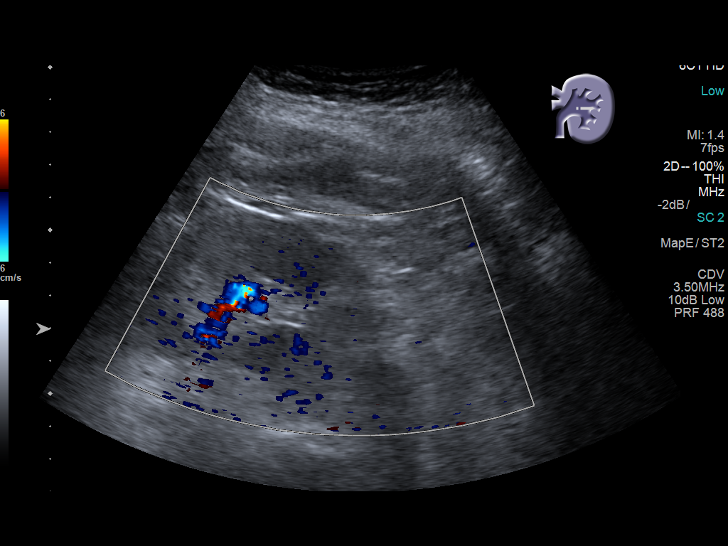
[im 25/30]
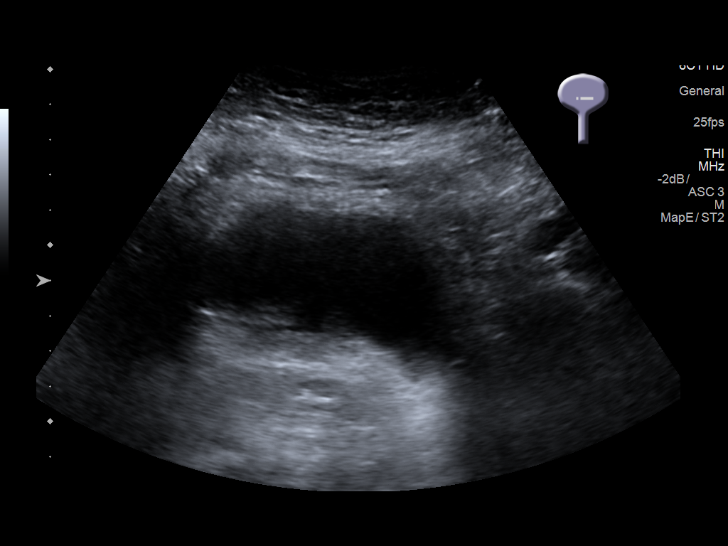
[im 27/30]
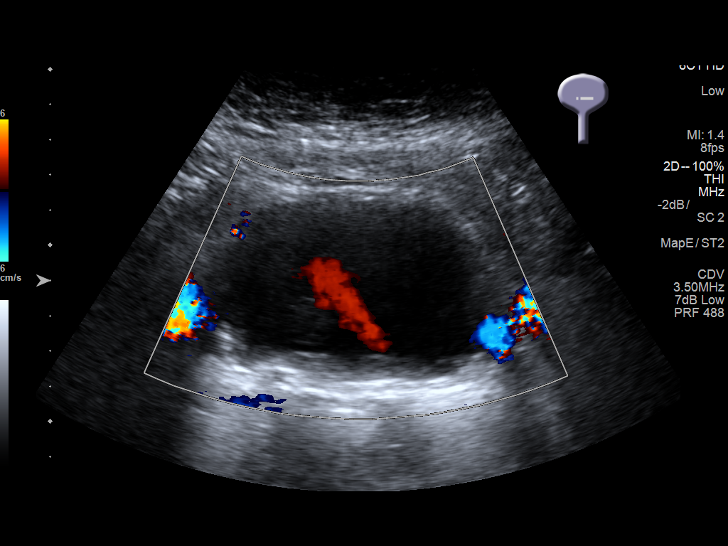
[im 30/30]
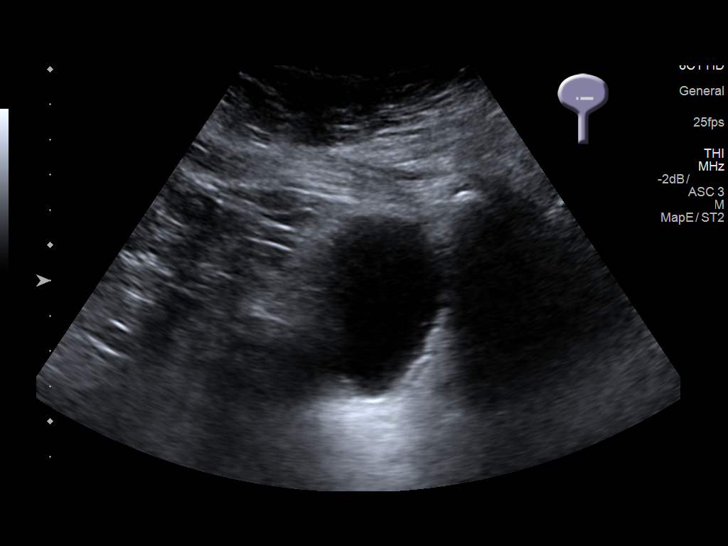

[14 of 25 positions shown; findings below may reference images not displayed]

FINDINGS: Right Kidney:

Length: 10.7 cm. The renal cortical echotexture is increased and is
equal to or slightly greater than that of the liver. There is no
focal mass nor hydronephrosis.

Left Kidney:

Length: 11.0 cm. The renal cortical echotexture is mildly increased
similar to that on the right. There is no focal mass nor
hydronephrosis.

Bladder:

The partially distended urinary bladder is normal. Ureteral jets are
observed bilaterally.
IMPRESSION: Increased renal cortical echotexture consistent with medical renal
disease. There is no hydronephrosis. The urinary bladder is
unremarkable.

## 2016-12-25 ENCOUNTER — Encounter: Payer: Self-pay | Admitting: Family Medicine

## 2016-12-25 ENCOUNTER — Other Ambulatory Visit (HOSPITAL_COMMUNITY): Payer: Self-pay | Admitting: Cardiology

## 2016-12-25 ENCOUNTER — Ambulatory Visit (INDEPENDENT_AMBULATORY_CARE_PROVIDER_SITE_OTHER): Payer: No Typology Code available for payment source | Admitting: Family Medicine

## 2016-12-25 VITALS — BP 147/80 | HR 74 | Temp 97.6°F | Resp 12 | Ht 65.0 in | Wt 224.0 lb

## 2016-12-25 DIAGNOSIS — K029 Dental caries, unspecified: Secondary | ICD-10-CM

## 2016-12-25 MED ORDER — TRAMADOL HCL 50 MG PO TABS: 50.0000 mg | ORAL_TABLET | Freq: Three times a day (TID) | ORAL | 0 refills | Status: DC | PRN

## 2016-12-25 MED ORDER — HYDROCHLOROTHIAZIDE 25 MG PO TABS: 25.0000 mg | ORAL_TABLET | Freq: Every day | ORAL | 3 refills | Status: DC

## 2016-12-25 MED ORDER — CLINDAMYCIN HCL 300 MG PO CAPS: 300.0000 mg | ORAL_CAPSULE | Freq: Three times a day (TID) | ORAL | 0 refills | Status: DC

## 2016-12-25 MED FILL — CLINDAMYCIN HCL 300 MG CAPS: 300 | 10 days supply | Qty: 30 | Fill #0

## 2016-12-25 MED FILL — HYDROCHLOROTHIAZIDE 25 MG T: 25 | 30 days supply | Qty: 30 | Fill #0

## 2016-12-27 MED FILL — SPIRONOLACTONE 25 MG TABLET: 25 | 30 days supply | Qty: 30 | Fill #2

## 2016-12-27 MED FILL — AMLODIPINE BESYLATE 10 MG T: 10 | 30 days supply | Qty: 30 | Fill #4

## 2016-12-28 ENCOUNTER — Telehealth (HOSPITAL_COMMUNITY): Payer: Self-pay | Admitting: Surgery

## 2016-12-28 NOTE — Telephone Encounter (Signed)
Mandy Serrano will be discharged from [facility] due to successful completion.

## 2017-01-02 MED FILL — LOSARTAN POTASSIUM 100 MG T: 100 | 30 days supply | Qty: 45 | Fill #3

## 2017-01-02 MED FILL — CARVEDILOL 6.25 MG TABLET: 6.25 | 30 days supply | Qty: 90 | Fill #3

## 2017-01-02 MED FILL — AMIODARONE HCL 200 MG TAB: 200 | 30 days supply | Qty: 15 | Fill #1

## 2017-01-03 NOTE — Progress Notes (Signed)
Mandy Serrano, is a 41 y.o. female  KJI:[id number]  LRJ:[id number]  DOB - 1976/07/30  CC:  Chief Complaint  Patient presents with  . follow up    patient still hasnt heard from [facility] and she has dental pain and headache from this issue       HPI: Mandy Serrano is a 41 y.o. female here requesting dental referral for dental pain.   No Known Allergies Past Medical History:  Diagnosis Date  . Malignant hypertension    Current Outpatient Prescriptions on File Prior to Visit  Medication Sig Dispense Refill  . acetaminophen (TYLENOL) 500 MG tablet Take 500 mg by mouth every 6 (six) hours as needed for mild pain or headache.    Marland Kitchen amiodarone (PACERONE) 100 MG tablet Take 1 tablet (100 mg total) by mouth daily. 30 tablet 6  . amLODipine (NORVASC) 10 MG tablet Take 1 tablet (10 mg total) by mouth daily. 30 tablet 6  . aspirin 81 MG EC tablet Take 1 tablet (81 mg total) by mouth daily. 30 tablet 3  . carvedilol (COREG) 6.25 MG tablet Take 1.5 tablets (9.375 mg total) by mouth 2 (two) times daily with a meal. 90 tablet 3  . hydrALAZINE (APRESOLINE) 100 MG tablet Take 1 tablet (100 mg total) by mouth 3 (three) times daily. 90 tablet 3  . losartan (COZAAR) 100 MG tablet Take 0.5-1 tablets (50-100 mg total) by mouth as directed. Take 100mg   (1 tab) in the AM and 50mg  (one half tab) in the PM 45 tablet 5  . spironolactone (ALDACTONE) 25 MG tablet Take 1 tablet (25 mg total) by mouth daily. 30 tablet 2   No current facility-administered medications on file prior to visit.    History reviewed. No pertinent family history. Social History   Social History  . Marital status: Married    Spouse name: N/A  . Number of children: N/A  . Years of education: N/A   Occupational History  . Not on file.   Social History Main Topics  . Smoking status: Never Smoker  . Smokeless tobacco: Never Used  . Alcohol use No  . Drug use: No  . Sexual activity: Not on file   Other Topics Concern   . Not on file   Social History Narrative  . No narrative on file    Review of Systems: Constitutional: Negative Skin: Negative HENT: Negative, except dental pain Eyes: Negative  Neck: Negative Respiratory: Negative Cardiovascular: Negative   Objective:   Vitals:   12/25/16 1538  BP: (!) 147/80  Pulse: 74  Resp: 12  Temp: 97.6 F (36.4 C)    Physical Exam: Constitutional: Patient appears well-developed and well-nourished. No distress. HENT: Normocephalic, atraumatic, External right and left ear normal. Oropharynx is clear and moist. There are several dental caries and several missing teeth. Eyes: Conjunctivae and EOM are normal. PERRLA, no scleral icterus. Neck: Normal ROM. Neck supple. No lymphadenopathy, No thyromegaly. CVS: RRR, S1/S2 +, no murmurs, no gallops, no rubs Pulmonary: Effort and breath sounds normal, no stridor, rhonchi, wheezes, rales.   Lab Results  Component Value Date   CREATININE 1.22 (H) 12/12/2016   BUN 19 12/12/2016   NA 134 (L) 12/12/2016   K 4.0 12/12/2016   CL 98 12/12/2016   CO2 27 12/12/2016    Lab Results  Component Value Date   HGBA1C 5.5 09/19/2016   Lipid Panel     Component Value Date/Time   CHOL 223 (H) 09/19/2016 1436  TRIG 104 09/19/2016 1436   HDL 51 09/19/2016 1436   CHOLHDL 4.4 09/19/2016 1436   VLDL 21 09/19/2016 1436   LDLCALC 151 (H) 09/19/2016 1436        Assessment and plan:   1. Dental caries -Referral to dental clinis  No Follow-up on file.  The patient was given clear instructions to go to ER or return to medical center if symptoms don't improve, worsen or new problems develop. The patient verbalized understanding.    Henrietta Hoover FNP  01/03/2017, 10:54 AM

## 2017-01-19 MED FILL — ASPIR-LOW EC 81 MG TABLET: 81 | 30 days supply | Qty: 30 | Fill #3

## 2017-01-22 MED FILL — HYDROCHLOROTHIAZIDE 25 MG T: 25 | 30 days supply | Qty: 30 | Fill #1

## 2017-01-22 MED FILL — hydrALAZINE HCL 100 MG TABS: 100 | 30 days supply | Qty: 90 | Fill #3

## 2017-01-22 MED FILL — SPIRONOLACTONE 25 MG TABLET: 25 | 30 days supply | Qty: 30 | Fill #0

## 2017-02-06 ENCOUNTER — Other Ambulatory Visit (HOSPITAL_COMMUNITY): Payer: Self-pay | Admitting: Adult Health

## 2017-02-06 MED FILL — AMIODARONE HCL 200 MG TAB: 200 | 30 days supply | Qty: 15 | Fill #2

## 2017-02-06 MED FILL — AMLODIPINE BESYLATE 10 MG T: 10 | 30 days supply | Qty: 30 | Fill #5

## 2017-02-06 MED FILL — LOSARTAN POTASSIUM 100 MG T: 100 | 30 days supply | Qty: 45 | Fill #4

## 2017-02-07 MED FILL — CARVEDILOL 6.25 MG TABLET: 6.25 | 30 days supply | Qty: 90 | Fill #0

## 2017-02-15 ENCOUNTER — Encounter (HOSPITAL_COMMUNITY): Payer: No Typology Code available for payment source

## 2017-02-20 ENCOUNTER — Other Ambulatory Visit (HOSPITAL_COMMUNITY): Payer: Self-pay | Admitting: Internal Medicine

## 2017-02-26 MED FILL — HYDROCHLOROTHIAZIDE 25 MG T: 25 | 30 days supply | Qty: 30 | Fill #2

## 2017-02-26 MED FILL — SPIRONOLACTONE 25 MG TABLET: 25 | 30 days supply | Qty: 30 | Fill #1

## 2017-03-12 MED FILL — AMIODARONE HCL 200 MG TAB: 200 | 30 days supply | Qty: 15 | Fill #3

## 2017-03-20 MED FILL — LOSARTAN POTASSIUM 100 MG T: 100 | 30 days supply | Qty: 45 | Fill #5

## 2017-03-26 ENCOUNTER — Ambulatory Visit: Payer: No Typology Code available for payment source | Admitting: Family Medicine

## 2017-03-26 ENCOUNTER — Encounter (HOSPITAL_COMMUNITY): Payer: No Typology Code available for payment source

## 2017-03-26 NOTE — Progress Notes (Deleted)
Patient ID: Mandy Serrano, female    DOB: [birth year], 41 y.o.   MRN: [id number]  PCP: Henrietta Hoover, NP  No chief complaint on file.   Subjective:  HPI  Mandy Serrano is a 41 y.o. female presents for evaluation of No chief complaint on file.     Eye exam since chronic amiodarone   Social History   Social History  . Marital status: Married    Spouse name: N/A  . Number of children: N/A  . Years of education: N/A   Occupational History  . Not on file.   Social History Main Topics  . Smoking status: Never Smoker  . Smokeless tobacco: Never Used  . Alcohol use No  . Drug use: No  . Sexual activity: Not on file   Other Topics Concern  . Not on file   Social History Narrative  . No narrative on file    No family history on file.   Review of Systems  Patient Active Problem List   Diagnosis Date Noted  . Frequent PVCs   . Acute pulmonary edema (HCC) 06/14/2016  . Acute systolic CHF (congestive heart failure) (HCC) 06/14/2016  . Malignant hypertension 06/12/2016    No Known Allergies  Prior to Admission medications   Medication Sig Start Date End Date Taking? Authorizing Provider  acetaminophen (TYLENOL) 500 MG tablet Take 500 mg by mouth every 6 (six) hours as needed for mild pain or headache.    [provider]  amiodarone (PACERONE) 100 MG tablet Take 1 tablet (100 mg total) by mouth daily. 11/22/16   Clegg, Amy D, NP  amLODipine (NORVASC) 10 MG tablet Take 1 tablet (10 mg total) by mouth daily. 07/19/16   Clegg, Amy D, NP  ASPIR-LOW 81 MG EC tablet TAKE 1 TABLET BY MOUTH DAILY. 02/20/17   Laurey Morale, MD  carvedilol (COREG) 6.25 MG tablet TAKE 1 & 1/2 TABLETS BY MOUTH 2 TIMES DAILY WITH A MEAL. 02/07/17   Bensimhon, Bevelyn Buckles, MD  clindamycin (CLEOCIN) 300 MG capsule Take 1 capsule (300 mg total) by mouth 3 (three) times daily. 12/25/16   Henrietta Hoover, NP  hydrALAZINE (APRESOLINE) 100 MG tablet Take 1 tablet (100 mg total) by mouth 3  (three) times daily. 06/29/16   Clegg, Amy D, NP  hydrochlorothiazide (HYDRODIURIL) 25 MG tablet Take 1 tablet (25 mg total) by mouth daily. 12/25/16 03/25/17  Bensimhon, Bevelyn Buckles, MD  losartan (COZAAR) 100 MG tablet Take 0.5-1 tablets (50-100 mg total) by mouth as directed. Take 100mg   (1 tab) in the AM and 50mg  (one half tab) in the PM 07/10/16   Bensimhon, Bevelyn Buckles, MD  spironolactone (ALDACTONE) 25 MG tablet Take 1 tablet (25 mg total) by mouth daily. 07/12/16   Clegg, Amy D, NP  traMADol (ULTRAM) 50 MG tablet Take 1 tablet (50 mg total) by mouth every 8 (eight) hours as needed. 12/25/16   Henrietta Hoover, NP    Past Medical, Surgical Family and Social History reviewed and updated.    Objective:  There were no vitals filed for this visit.  Wt Readings from Last 3 Encounters:  12/25/16 224 lb (101.6 kg)  12/21/16 224 lb (101.6 kg)  12/12/16 226 lb 9.6 oz (102.8 kg)    Physical Exam         Assessment & Plan:  There are no diagnoses linked to this encounter.   Godfrey Pick. Tiburcio Pea, MSN, FNP-C [facility]  [phone]  [address] [place], [place] [address] [phone]

## 2017-04-02 ENCOUNTER — Other Ambulatory Visit (HOSPITAL_COMMUNITY): Payer: Self-pay | Admitting: Adult Health

## 2017-04-02 MED FILL — CARVEDILOL 6.25 MG TABLET: 6.25 | 30 days supply | Qty: 90 | Fill #1

## 2017-04-02 MED FILL — SPIRONOLACTONE 25 MG TABLET: 25 | 30 days supply | Qty: 30 | Fill #2

## 2017-04-02 MED FILL — HYDROCHLOROTHIAZIDE 25 MG T: 25 | 30 days supply | Qty: 30 | Fill #3

## 2017-04-03 MED FILL — hydrALAZINE HCL 100 MG TABS: 100 | 30 days supply | Qty: 90 | Fill #0

## 2017-04-09 ENCOUNTER — Encounter (HOSPITAL_COMMUNITY): Payer: No Typology Code available for payment source

## 2017-04-10 MED FILL — AMIODARONE HCL 200 MG TAB: 200 | 30 days supply | Qty: 15 | Fill #4

## 2017-04-26 ENCOUNTER — Other Ambulatory Visit (HOSPITAL_COMMUNITY): Payer: Self-pay | Admitting: Internal Medicine

## 2017-04-26 MED FILL — SPIRONOLACTONE 25 MG TABLET: 25 | 30 days supply | Qty: 30 | Fill #3

## 2017-04-30 MED FILL — LOSARTAN POTASSIUM 100 MG T: 100 | 30 days supply | Qty: 45 | Fill #0

## 2017-04-30 MED FILL — HYDROCHLOROTHIAZIDE 25 MG T: 25 | 30 days supply | Qty: 30 | Fill #0

## 2017-05-10 MED FILL — AMIODARONE HCL 200 MG TAB: 200 | 30 days supply | Qty: 15 | Fill #5

## 2017-05-14 ENCOUNTER — Ambulatory Visit (INDEPENDENT_AMBULATORY_CARE_PROVIDER_SITE_OTHER): Payer: No Typology Code available for payment source | Admitting: Family Medicine

## 2017-05-14 ENCOUNTER — Encounter: Payer: Self-pay | Admitting: Family Medicine

## 2017-05-14 VITALS — BP 136/88 | HR 74 | Temp 98.0°F | Resp 16 | Ht 65.0 in | Wt 236.0 lb

## 2017-05-14 DIAGNOSIS — K029 Dental caries, unspecified: Secondary | ICD-10-CM

## 2017-05-14 DIAGNOSIS — N644 Mastodynia: Secondary | ICD-10-CM

## 2017-05-14 DIAGNOSIS — Z3202 Encounter for pregnancy test, result negative: Secondary | ICD-10-CM

## 2017-05-14 LAB — POCT URINALYSIS DIP (DEVICE)
Bilirubin Urine: NEGATIVE
Glucose, UA: NEGATIVE mg/dL
HGB URINE DIPSTICK: NEGATIVE
Ketones, ur: NEGATIVE mg/dL
LEUKOCYTES UA: NEGATIVE
Nitrite: NEGATIVE
Protein, ur: NEGATIVE mg/dL
SPECIFIC GRAVITY, URINE: 1.015 (ref 1.005–1.030)
UROBILINOGEN UA: 0.2 mg/dL (ref 0.0–1.0)
pH: 5.5 (ref 5.0–8.0)

## 2017-05-14 NOTE — Patient Instructions (Addendum)
Contact the [facility] program at the breast center to schedule your mammogram [phone].  Please follow-up with cardiology for follow-up of heart failure.  Return in 3 months for wellness visit and PAP    Heart Failure Heart failure is a condition in which the heart has trouble pumping blood because it has become weak or stiff. This means that the heart does not pump blood efficiently for the body to work well. For some people with heart failure, fluid may back up into the lungs and there may be swelling (edema) in the lower legs. Heart failure is usually a long-term (chronic) condition. It is important for you to take good care of yourself and follow the treatment plan from your health care provider. What are the causes? This condition is caused by some health problems, including:  High blood pressure (hypertension). Hypertension causes the heart muscle to work harder than normal. High blood pressure eventually causes the heart to become stiff and weak.  Coronary artery disease (CAD). CAD is the buildup of cholesterol and fat (plaques) in the arteries of the heart.  Heart attack (myocardial infarction). Injured tissue, which is caused by the heart attack, does not contract as well and the heart's ability to pump blood is weakened.  Abnormal heart valves. When the heart valves do not open and close properly, the heart muscle must pump harder to keep the blood flowing.  Heart muscle disease (cardiomyopathy or myocarditis). Heart muscle disease is damage to the heart muscle from a variety of causes, such as drug or alcohol abuse, infections, or unknown causes. These can increase the risk of heart failure.  Lung disease. When the lungs do not work properly, the heart must work harder.  What increases the risk? Risk of heart failure increases as a person ages. This condition is also more likely to develop in people who:  Are overweight.  Are female.  Smoke or chew tobacco.  Abuse alcohol or  illegal drugs.  Have taken medicines that can damage the heart, such as chemotherapy drugs.  Have diabetes. ? High blood sugar (glucose) is associated with high fat (lipid) levels in the blood. ? Diabetes can also damage tiny blood vessels that carry nutrients to the heart muscle.  Have abnormal heart rhythms.  Have thyroid problems.  Have low blood counts (anemia).  What are the signs or symptoms? Symptoms of this condition include:  Shortness of breath with activity, such as when climbing stairs.  Persistent cough.  Swelling of the feet, ankles, legs, or abdomen.  Unexplained weight gain.  Difficulty breathing when lying flat (orthopnea).  Waking from sleep because of the need to sit up and get more air.  Rapid heartbeat.  Fatigue and loss of energy.  Feeling light-headed, dizzy, or close to fainting.  Loss of appetite.  Nausea.  Increased urination during the night (nocturia).  Confusion.  How is this diagnosed? This condition is diagnosed based on:  Medical history, symptoms, and a physical exam.  Diagnostic tests, which may include: ? Echocardiogram. ? Electrocardiogram (ECG). ? Chest X-ray. ? Blood tests. ? Exercise stress test. ? Radionuclide scans. ? Cardiac catheterization and angiogram.  How is this treated? Treatment for this condition is aimed at managing the symptoms of heart failure. Medicines, behavioral changes, or other treatments may be necessary to treat heart failure. Medicines These may include:  Angiotensin-converting enzyme (ACE) inhibitors. This type of medicine blocks the effects of a blood protein called angiotensin-converting enzyme. ACE inhibitors relax (dilate) the blood vessels and  help to lower blood pressure.  Angiotensin receptor blockers (ARBs). This type of medicine blocks the actions of a blood protein called angiotensin. ARBs dilate the blood vessels and help to lower blood pressure.  Water pills (diuretics).  Diuretics cause the kidneys to remove salt and water from the blood. The extra fluid is removed through urination, leaving a lower volume of blood that the heart has to pump.  Beta blockers. These improve heart muscle strength and they prevent the heart from beating too quickly.  Digoxin. This increases the force of the heartbeat.  Healthy behavior changes These may include:  Reaching and maintaining a healthy weight.  Stopping smoking or chewing tobacco.  Eating heart-healthy foods.  Limiting or avoiding alcohol.  Stopping use of street drugs (illegal drugs).  Physical activity.  Other treatments These may include:  Surgery to open blocked coronary arteries or repair damaged heart valves.  Placement of a biventricular pacemaker to improve heart muscle function (cardiac resynchronization therapy). This device paces both the right ventricle and left ventricle.  Placement of a device to treat serious abnormal heart rhythms (implantable cardioverter defibrillator, or ICD).  Placement of a device to improve the pumping ability of the heart (left ventricular assist device, or LVAD).  Heart transplant. This can cure heart failure, and it is considered for certain patients who do not improve with other therapies.  Follow these instructions at home: Medicines  Take over-the-counter and prescription medicines only as told by your health care provider. Medicines are important in reducing the workload of your heart, slowing the progression of heart failure, and improving your symptoms. ? Do not stop taking your medicine unless your health care provider told you to do that. ? Do not skip any dose of medicine. ? Refill your prescriptions before you run out of medicine. You need your medicines every day. Eating and drinking   Eat heart-healthy foods. Talk with a dietitian to make an eating plan that is right for you. ? Choose foods that contain no trans fat and are low in saturated fat  and cholesterol. Healthy choices include fresh or frozen fruits and vegetables, fish, lean meats, legumes, fat-free or low-fat dairy products, and whole-grain or high-fiber foods. ? Limit salt (sodium) if directed by your health care provider. Sodium restriction may reduce symptoms of heart failure. Ask a dietitian to recommend heart-healthy seasonings. ? Use healthy cooking methods instead of frying. Healthy methods include roasting, grilling, broiling, baking, poaching, steaming, and stir-frying.  Limit your fluid intake if directed by your health care provider. Fluid restriction may reduce symptoms of heart failure. Lifestyle  Stop smoking or using chewing tobacco. Nicotine and tobacco can damage your heart and your blood vessels. Do not use nicotine gum or patches before talking to your health care provider.  Limit alcohol intake to no more than 1 drink per day for non-pregnant women and 2 drinks per day for men. One drink equals 12 oz of beer, 5 oz of wine, or 1 oz of hard liquor. ? Drinking more than that is harmful to your heart. Tell your health care provider if you drink alcohol several times a week. ? Talk with your health care provider about whether any level of alcohol use is safe for you. ? If your heart has already been damaged by alcohol or you have severe heart failure, drinking alcohol should be stopped completely.  Stop use of illegal drugs.  Lose weight if directed by your health care provider. Weight loss may reduce symptoms  of heart failure.  Do moderate physical activity if directed by your health care provider. People who are elderly and people with severe heart failure should consult with a health care provider for physical activity recommendations. Monitor important information  Weigh yourself every day. Keeping track of your weight daily helps you to notice excess fluid sooner. ? Weigh yourself every morning after you urinate and before you eat breakfast. ? Wear the  same amount of clothing each time you weigh yourself. ? Record your daily weight. Provide your health care provider with your weight record.  Monitor and record your blood pressure as told by your health care provider.  Check your pulse as told by your health care provider. Dealing with extreme temperatures  If the weather is extremely hot: ? Avoid vigorous physical activity. ? Use air conditioning or fans or seek a cooler location. ? Avoid caffeine and alcohol. ? Wear loose-fitting, lightweight, and light-colored clothing.  If the weather is extremely cold: ? Avoid vigorous physical activity. ? Layer your clothes. ? Wear mittens or gloves, a hat, and a scarf when you go outside. ? Avoid alcohol. General instructions  Manage other health conditions such as hypertension, diabetes, thyroid disease, or abnormal heart rhythms as told by your health care provider.  Learn to manage stress. If you need help to do this, ask your health care provider.  Plan rest periods when fatigued.  Get ongoing education and support as needed.  Participate in or seek rehabilitation as needed to maintain or improve independence and quality of life.  Stay up to date with immunizations. Keeping current on pneumococcal and influenza immunizations is especially important to prevent respiratory infections.  Keep all follow-up visits as told by your health care provider. This is important. Contact a health care provider if:  You have a rapid weight gain.  You have increasing shortness of breath that is unusual for you.  You are unable to participate in your usual physical activities.  You tire easily.  You cough more than normal, especially with physical activity.  You have any swelling or more swelling in areas such as your hands, feet, ankles, or abdomen.  You are unable to sleep because it is hard to breathe.  You feel like your heart is beating quickly (palpitations).  You become dizzy or  light-headed when you stand up. Get help right away if:  You have difficulty breathing.  You notice or your family notices a change in your awareness, such as having trouble staying awake or having difficulty with concentration.  You have pain or discomfort in your chest.  You have an episode of fainting (syncope). This information is not intended to replace advice given to you by your health care provider. Make sure you discuss any questions you have with your health care provider. Document Released: 10/09/2005 Document Revised: 06/13/2016 Document Reviewed: 05/03/2016 Elsevier Interactive Patient Education  2017 [employer].

## 2017-05-14 NOTE — Progress Notes (Signed)
Patient ID: Mandy Serrano, female    DOB: [birth year], 41 y.o.   MRN: [id number]  PCP: Bing Neighbors, FNP  Chief Complaint  Patient presents with  . Headache  . Breast Pain    Very tender    Subjective:  HPI Mandy Serrano is a 41 y.o. female presents for evaluation of headaches and breast pain. Medical history includes malignant hypertension, obesity, congestive heart failure, and pulmonary edema. Mandy Serrano presents today with concern of pregnancy as her breast have been intermittently tender to touch bilaterally. She denies nipple discharge or redness. She has no family history of breast cancer and has never obtained mammagram previously. Patient's last menstrual period was 04/30/2017 which she reports was normal. Mandy Serrano reports a headaches which she attributes to ongoing dental problems for which she has a pending dentistry referral however only has orange card for financial assistance. Headaches are in the temple region, lower jaw line, and neck region. She has not attempted relief with any conservative or OTC medications. Headaches resolve with rest. Denies any associated dizziness, visual disturbances, or unilateral weakness. Social History   Social History  . Marital status: Married    Spouse name: N/A  . Number of children: N/A  . Years of education: N/A   Occupational History  . Not on file.   Social History Main Topics  . Smoking status: Never Smoker  . Smokeless tobacco: Never Used  . Alcohol use No  . Drug use: No  . Sexual activity: Not on file   Other Topics Concern  . Not on file   Social History Narrative  . No narrative on file   Review of Systems See HPI Patient Active Problem List   Diagnosis Date Noted  . Frequent PVCs   . Acute pulmonary edema (HCC) 06/14/2016  . Acute systolic CHF (congestive heart failure) (HCC) 06/14/2016  . Malignant hypertension 06/12/2016    No Known Allergies  Prior to Admission medications   Medication Sig Start Date  End Date Taking? Authorizing Provider  amiodarone (PACERONE) 100 MG tablet Take 1 tablet (100 mg total) by mouth daily. 11/22/16  Yes Clegg, Amy D, NP  amLODipine (NORVASC) 10 MG tablet Take 1 tablet (10 mg total) by mouth daily. 07/19/16  Yes Clegg, Amy D, NP  ASPIR-LOW 81 MG EC tablet TAKE 1 TABLET BY MOUTH DAILY. 02/20/17  Yes Laurey Morale, MD  carvedilol (COREG) 6.25 MG tablet TAKE 1 & 1/2 TABLETS BY MOUTH 2 TIMES DAILY WITH A MEAL. 02/07/17  Yes Bensimhon, Bevelyn Buckles, MD  hydrALAZINE (APRESOLINE) 100 MG tablet TAKE 1 TABLET BY MOUTH 3 TIMES DAILY. 04/03/17  Yes Bensimhon, Bevelyn Buckles, MD  hydrochlorothiazide (HYDRODIURIL) 25 MG tablet TAKE 1 TABLET (25 MG TOTAL) BY MOUTH DAILY. 04/30/17 07/29/17 Yes Laurey Morale, MD  losartan (COZAAR) 100 MG tablet TAKE 1 TABLET IN THE MORNING AND 1/2 TABLET IN THE EVENING 04/30/17  Yes Laurey Morale, MD  spironolactone (ALDACTONE) 25 MG tablet Take 1 tablet (25 mg total) by mouth daily. 07/12/16  Yes Clegg, Amy D, NP  acetaminophen (TYLENOL) 500 MG tablet Take 500 mg by mouth every 6 (six) hours as needed for mild pain or headache.    [provider]  clindamycin (CLEOCIN) 300 MG capsule Take 1 capsule (300 mg total) by mouth 3 (three) times daily. 12/25/16   Henrietta Hoover, NP  traMADol (ULTRAM) 50 MG tablet Take 1 tablet (50 mg total) by mouth every 8 (eight) hours as needed. 12/25/16  Henrietta Hoover, NP    Past Medical, Surgical Family and Social History reviewed and updated.    Objective:   Today's Vitals   05/14/17 1333  BP: 136/88  Pulse: 74  Resp: 16  Temp: 98 F (36.7 C)  TempSrc: Oral  SpO2: 100%  Weight: 236 lb (107 kg)  Height: 5\' 5"  (1.651 m)    Wt Readings from Last 3 Encounters:  05/14/17 236 lb (107 kg)  12/25/16 224 lb (101.6 kg)  12/21/16 224 lb (101.6 kg)   Physical Exam  Constitutional: She is oriented to person, place, and time. She appears well-developed and well-nourished.  HENT:  Head: Normocephalic and  atraumatic.  Eyes: Pupils are equal, round, and reactive to light. Conjunctivae and EOM are normal.  Neck: Normal range of motion. Neck supple.  Cardiovascular: Normal rate, regular rhythm, normal heart sounds and intact distal pulses.   Pulmonary/Chest: Effort normal and breath sounds normal.  Genitourinary:  Genitourinary Comments: Breasts are symmetric without cutaneous changes, nipple inversion or discharge. No masses or tenderness, and no axillary lymphadenopathy.  Musculoskeletal: Normal range of motion.  Lymphadenopathy:    She has no cervical adenopathy.  Neurological: She is alert and oriented to person, place, and time.  Skin: Skin is warm and dry.  Psychiatric: She has a normal mood and affect. Her behavior is normal. Judgment and thought content normal.   Assessment & Plan:  1. Dental caries - Ambulatory referral to Dentistry  2. Breast tenderness - MM Digital Screening; Future  3. Headaches  -You may take tylenol and or ibuprofen as directed for headache pain. If symptoms worsen or do not Improve, follow-up for further evaluation and treatment.  RTC: 3 month for gynecology exam    Godfrey Pick. Tiburcio Pea, MSN, FNP-C [facility]  [address] [place], [place] [address] [phone]

## 2017-05-18 MED FILL — CARVEDILOL 6.25 MG TABLET: 6.25 | 30 days supply | Qty: 90 | Fill #2

## 2017-05-22 MED FILL — SPIRONOLACTONE 25 MG TABLET: 25 | 30 days supply | Qty: 30 | Fill #4

## 2017-05-22 MED FILL — HYDROCHLOROTHIAZIDE 25 MG T: 25 | 30 days supply | Qty: 30 | Fill #1

## 2017-05-22 MED FILL — AMLODIPINE BESYLATE 10 MG T: 10 | 30 days supply | Qty: 30 | Fill #6

## 2017-06-06 MED FILL — AMIODARONE HCL 200 MG TAB: 200 | 30 days supply | Qty: 15 | Fill #6

## 2017-06-06 MED FILL — LOSARTAN POTASSIUM 100 MG T: 100 | 30 days supply | Qty: 45 | Fill #1

## 2017-06-20 MED FILL — hydrALAZINE HCL 100 MG TABS: 100 | 30 days supply | Qty: 90 | Fill #1

## 2017-07-03 MED FILL — SPIRONOLACTONE 25 MG TABLET: 25 | 30 days supply | Qty: 30 | Fill #5

## 2017-07-03 MED FILL — HYDROCHLOROTHIAZIDE 25 MG T: 25 | 30 days supply | Qty: 30 | Fill #2

## 2017-07-19 ENCOUNTER — Other Ambulatory Visit (HOSPITAL_COMMUNITY): Payer: Self-pay | Admitting: Adult Health

## 2017-07-19 MED FILL — CARVEDILOL 6.25 MG TABLET: 6.25 | 30 days supply | Qty: 90 | Fill #3

## 2017-07-20 MED FILL — AMIODARONE HCL 200 MG TAB: 200 | 30 days supply | Qty: 15 | Fill #0

## 2017-08-02 MED FILL — LOSARTAN POTASSIUM 100 MG T: 100 | 30 days supply | Qty: 45 | Fill #2

## 2017-08-06 ENCOUNTER — Other Ambulatory Visit (HOSPITAL_COMMUNITY): Payer: Self-pay | Admitting: Internal Medicine

## 2017-08-06 MED FILL — HYDROCHLOROTHIAZIDE 25 MG T: 25 | 30 days supply | Qty: 30 | Fill #3

## 2017-08-07 MED FILL — SPIRONOLACTONE 25 MG TABLET: 25 | 30 days supply | Qty: 30 | Fill #0

## 2017-08-14 ENCOUNTER — Ambulatory Visit: Payer: No Typology Code available for payment source | Admitting: Family Medicine

## 2017-08-30 ENCOUNTER — Other Ambulatory Visit (HOSPITAL_COMMUNITY): Payer: Self-pay | Admitting: Internal Medicine

## 2017-08-31 MED FILL — AMIODARONE HCL 200 MG TAB: 200 | 30 days supply | Qty: 15 | Fill #0

## 2017-09-11 MED FILL — HYDROCHLOROTHIAZIDE 25 MG T: 25 | 30 days supply | Qty: 30 | Fill #4

## 2017-09-11 MED FILL — SPIRONOLACTONE 25 MG TABLET: 25 | 30 days supply | Qty: 30 | Fill #1

## 2017-09-25 MED FILL — LOSARTAN POTASSIUM 100 MG T: 100 | 30 days supply | Qty: 45 | Fill #3

## 2017-10-03 ENCOUNTER — Other Ambulatory Visit (HOSPITAL_COMMUNITY): Payer: Self-pay | Admitting: Internal Medicine

## 2017-10-11 MED FILL — SPIRONOLACTONE 25 MG TABLET: 25 | 30 days supply | Qty: 30 | Fill #2

## 2017-10-11 MED FILL — hydrALAZINE HCL 100 MG TABS: 100 | 30 days supply | Qty: 90 | Fill #2

## 2017-10-11 MED FILL — HYDROCHLOROTHIAZIDE 25 MG T: 25 | 30 days supply | Qty: 30 | Fill #5

## 2017-11-06 MED FILL — LOSARTAN POTASSIUM 100 MG T: 100 | 30 days supply | Qty: 45 | Fill #4

## 2017-11-12 ENCOUNTER — Ambulatory Visit (HOSPITAL_COMMUNITY)
Admission: RE | Admit: 2017-11-12 | Discharge: 2017-11-12 | Disposition: A | Discharge status: Home / Self Care | Payer: No Typology Code available for payment source | Source: Ambulatory Visit | Attending: Internal Medicine | Admitting: Internal Medicine

## 2017-11-12 ENCOUNTER — Encounter (HOSPITAL_COMMUNITY): Payer: Self-pay | Admitting: Internal Medicine

## 2017-11-12 ENCOUNTER — Other Ambulatory Visit: Payer: Self-pay

## 2017-11-12 ENCOUNTER — Encounter (HOSPITAL_COMMUNITY): Payer: No Typology Code available for payment source | Admitting: Internal Medicine

## 2017-11-12 VITALS — BP 145/91 | HR 102 | Wt 240.8 lb

## 2017-11-12 DIAGNOSIS — I428 Other cardiomyopathies: Secondary | ICD-10-CM | POA: Insufficient documentation

## 2017-11-12 DIAGNOSIS — I5022 Chronic systolic (congestive) heart failure: Secondary | ICD-10-CM | POA: Insufficient documentation

## 2017-11-12 DIAGNOSIS — D571 Sickle-cell disease without crisis: Secondary | ICD-10-CM | POA: Insufficient documentation

## 2017-11-12 DIAGNOSIS — I10 Essential (primary) hypertension: Secondary | ICD-10-CM

## 2017-11-12 DIAGNOSIS — Z79899 Other long term (current) drug therapy: Secondary | ICD-10-CM | POA: Insufficient documentation

## 2017-11-12 DIAGNOSIS — I493 Ventricular premature depolarization: Secondary | ICD-10-CM

## 2017-11-12 DIAGNOSIS — E669 Obesity, unspecified: Secondary | ICD-10-CM | POA: Insufficient documentation

## 2017-11-12 DIAGNOSIS — I110 Hypertensive heart disease with heart failure: Secondary | ICD-10-CM | POA: Insufficient documentation

## 2017-11-12 MED ORDER — HYDROCHLOROTHIAZIDE 25 MG PO TABS: 25.0000 mg | ORAL_TABLET | Freq: Every day | ORAL | 2 refills | Status: DC

## 2017-11-12 MED ORDER — CARVEDILOL 6.25 MG PO TABS: 6.2500 mg | ORAL_TABLET | Freq: Two times a day (BID) | ORAL | 2 refills | Status: DC

## 2017-11-12 MED ORDER — SPIRONOLACTONE 25 MG PO TABS: 25.0000 mg | ORAL_TABLET | Freq: Every day | ORAL | 2 refills | Status: DC

## 2017-11-12 MED ORDER — HYDRALAZINE HCL 100 MG PO TABS: 100.0000 mg | ORAL_TABLET | Freq: Three times a day (TID) | ORAL | 2 refills | Status: DC

## 2017-11-12 MED ORDER — AMLODIPINE BESYLATE 10 MG PO TABS: 10.0000 mg | ORAL_TABLET | Freq: Every day | ORAL | 2 refills | Status: DC

## 2017-11-12 MED ORDER — LOSARTAN POTASSIUM 100 MG PO TABS: ORAL_TABLET | ORAL | 5 refills | Status: DC

## 2017-11-12 MED FILL — SPIRONOLACTONE 25 MG TABLET: 25 | 34 days supply | Qty: 34 | Fill #0

## 2017-11-12 MED FILL — hydrALAZINE HCL 100 MG TABS: 100 | 34 days supply | Qty: 102 | Fill #0

## 2017-11-12 MED FILL — HYDROCHLOROTHIAZIDE 25 MG T: 25 | 30 days supply | Qty: 30 | Fill #0

## 2017-11-12 MED FILL — AMLODIPINE BESYLATE 10 MG T: 10 | 34 days supply | Qty: 34 | Fill #0

## 2017-11-12 MED FILL — CARVEDILOL 6.25 MG TABLET: 6.25 | 34 days supply | Qty: 68 | Fill #0

## 2017-11-12 NOTE — Patient Instructions (Signed)
Restart Meds, prescription sent to [facility]  Your physician recommends that you schedule a follow-up appointment in: Dr Johney Frame for [facility] ablation  Your physician recommends that you schedule a follow-up appointment in: 3 months

## 2017-11-12 NOTE — Progress Notes (Signed)
Prescription for all heart meds faxed to [facility] under HF FUND, message sent to Lasandra Beech, CSW to f/u with patient.

## 2017-11-12 NOTE — Progress Notes (Signed)
ADVANCED HF CLINIC NOTE  PCP: None  Primary Cardiologist: Dr Gala Romney   HPI [name] is a 42 year old with history of obesity, HTN ( was on 5 anti-HTN medicines in [place]), sickle cell anemia, systolic heart failure 05/2016, headaches, and frequent PVCs.  Admitted 8/17 with increased dyspnea and HTN crisis. She had not been taking any medications over the last last year. New acute systolic heart identified on ECHO. LVEF ~25%. CMRI no infiltrative/inflamatory process and EF ~25%. Had high PVC burden so she was started on amio. TSH was normal. Unable to obtain Entresto because she is not [place] citizen. Discharge weight 225 pounds.   Echo 12/17 with EF 55%  Today she presents for HF follow up. Has not been seen here for 1 year.  Ran out of amiodarone 2 months ago. Also ran out of amlodipine and carvedilol about a month ago.   Here to get meds refilled. Says she feels ok. Denies SOB or edema. Is doing exercises in front of TV with Zumba and walking 1 mile. Denies edema, orthopnea and PND. Highest her SBP has gotten was 140s. Denies snoring. Sleeps fairly well. Not drinking caffeine    Labs 06/22/2016: K 5.1 Creatinine 1.15  Labs 06/29/2016: K 5.1 Creatinine 1.29    ROS: All systems negative except as listed in HPI, PMH and Problem List.  SH:  Social History   Socioeconomic History  . Marital status: Married    Spouse name: Not on file  . Number of children: Not on file  . Years of education: Not on file  . Highest education level: Not on file  Social Needs  . Financial resource strain: Not on file  . Food insecurity - worry: Not on file  . Food insecurity - inability: Not on file  . Transportation needs - medical: Not on file  . Transportation needs - non-medical: Not on file  Occupational History  . Not on file  Tobacco Use  . Smoking status: Never Smoker  . Smokeless tobacco: Never Used  Substance and Sexual Activity  . Alcohol use: No  . Drug use: No  . Sexual activity:  Not on file  Other Topics Concern  . Not on file  Social History Narrative  . Not on file    FH: No family history on file.  Past Medical History:  Diagnosis Date  . Malignant hypertension     Current Outpatient Medications  Medication Sig Dispense Refill  . acetaminophen (TYLENOL) 500 MG tablet Take 500 mg by mouth every 6 (six) hours as needed for mild pain or headache.    Marland Kitchen amiodarone (PACERONE) 200 MG tablet Take 0.5 tablets (100 mg total) daily by mouth. Needs office visit 15 tablet 0  . amLODipine (NORVASC) 10 MG tablet Take 1 tablet (10 mg total) by mouth daily. 30 tablet 6  . ASPIR-LOW 81 MG EC tablet TAKE 1 TABLET BY MOUTH DAILY. 30 tablet 3  . carvedilol (COREG) 6.25 MG tablet TAKE 1 & 1/2 TABLETS BY MOUTH 2 TIMES DAILY WITH A MEAL. 90 tablet 3  . clindamycin (CLEOCIN) 300 MG capsule Take 1 capsule (300 mg total) by mouth 3 (three) times daily. 30 capsule 0  . hydrALAZINE (APRESOLINE) 100 MG tablet TAKE 1 TABLET BY MOUTH 3 TIMES DAILY. 90 tablet 3  . hydrochlorothiazide (HYDRODIURIL) 25 MG tablet TAKE 1 TABLET (25 MG TOTAL) BY MOUTH DAILY. 30 tablet 5  . losartan (COZAAR) 100 MG tablet TAKE 1 TABLET IN THE MORNING AND  1/2 TABLET IN THE EVENING 45 tablet 5  . spironolactone (ALDACTONE) 25 MG tablet Take 1 tablet (25 mg total) by mouth daily. 30 tablet 2  . spironolactone (ALDACTONE) 25 MG tablet TAKE 1 TABLET BY MOUTH DAILY. 30 tablet 5  . traMADol (ULTRAM) 50 MG tablet Take 1 tablet (50 mg total) by mouth every 8 (eight) hours as needed. 30 tablet 0   No current facility-administered medications for this encounter.     Vitals:   11/12/17 1037  BP: (!) 145/91  Pulse: (!) 102  SpO2: 100%  Weight: 240 lb 12 oz (109.2 kg)   PHYSICAL EXAM: General:  Well appearing. No resp difficulty HEENT: normal Neck: supple. no JVD. Carotids 2+ bilat; no bruits. No lymphadenopathy or thryomegaly appreciated. Cor: PMI nondisplaced. Regular rate & rhythm with ectopy No rubs, gallops  or murmurs. Lungs: clear Abdomen: soft, nontender, nondistended. No hepatosplenomegaly. No bruits or masses. Good bowel sounds. Extremities: no cyanosis, clubbing, rash, edema Neuro: alert & orientedx3, cranial nerves grossly intact. moves all 4 extremities w/o difficulty. Affect pleasant   ECG: NSR 94 with frequent monomorphic PVCs Personally reviewed   ASSESSMENT & PLAN: 1. Chronic Systolic Heart Failure- NICM- ECHO 05/2016  LVEF ~25-30%. CMRI no infiltrative/inflamatory process and EF ~25%.  EF recovery noted 09/2017 with EF up to 55%. NICM HTN  vs PVC induced - Doing well. Remains NYHA I.  - Volume status ok.  - Resume previous medications - Unclear if previous CM was due to PVCs or HTN. Off amio now and PVCs are back. Wil repeat echo at bedside today. If EF ok will refer to EP to consider flecainide vs ablation. If EF down will need amio again .  2. HTN - BP slightly up will resume medications 3. PVCs -While in hospital had 16% PVCs in 8/17. Amio added. PVCs suppressed with EF recovery. Off amio and PVCs back. See management above.     Addendum: Echo today performed personally at bedside EF 55%. Will refer to Dr. Johney Frame to discuss PVC ablation vs flecainide to control PVCs and reduce risk of recurrent HF.   Total time spent 45 minutes. Over half that time spent discussing above.   Arvilla Meres MD 10:31 AM

## 2017-11-15 ENCOUNTER — Telehealth: Payer: Self-pay | Admitting: Licensed Clinical Social Worker

## 2017-11-15 NOTE — Telephone Encounter (Signed)
CSW referred to assist with medication assistance as patient has no insurance. Patient may be eligible for the Med Assist program and CSW attempted to reach patient to discuss further with message left for return call. Lasandra Beech, LCSW, CCSW-MCS [phone]

## 2017-11-15 NOTE — Telephone Encounter (Signed)
Patient returned call to discuss referral for insurance and medication assistance. CSW discussed Social Security and process for completing application. CSW also discussed [place] Med Assist program and forwarded application to patient via mail. Patient plans to follow up and complete application in hopes of approval. CSW offered support and contact info for further needs should they arise. Patient grateful for assistance. Lasandra Beech, LCSW, CCSW-MCS [phone]

## 2017-11-16 ENCOUNTER — Ambulatory Visit (INDEPENDENT_AMBULATORY_CARE_PROVIDER_SITE_OTHER): Payer: No Typology Code available for payment source | Admitting: Cardiology

## 2017-11-16 ENCOUNTER — Encounter: Payer: Self-pay | Admitting: Cardiology

## 2017-11-16 VITALS — BP 138/96 | HR 74 | Ht 65.0 in | Wt 238.0 lb

## 2017-11-16 DIAGNOSIS — I10 Essential (primary) hypertension: Secondary | ICD-10-CM

## 2017-11-16 DIAGNOSIS — I428 Other cardiomyopathies: Secondary | ICD-10-CM

## 2017-11-16 DIAGNOSIS — I493 Ventricular premature depolarization: Secondary | ICD-10-CM

## 2017-11-16 MED ORDER — FLECAINIDE ACETATE 100 MG PO TABS: 100.0000 mg | ORAL_TABLET | Freq: Two times a day (BID) | ORAL | 3 refills | Status: DC

## 2017-11-16 MED FILL — FLECAINIDE ACETATE 100 MG T: 100 | 30 days supply | Qty: 60 | Fill #0

## 2017-11-16 NOTE — Progress Notes (Signed)
Electrophysiology Office Note   Date:  11/16/2017   ID:  Mandy Serrano [birth year], MRN [id number]  PCP:  Mandy Neighbors, FNP  Cardiologist:  Mandy Serrano Primary Electrophysiologist:  Mandy Serrano Mandy Loa, MD    Chief Complaint  Patient presents with  . Advice Only    PVC's/Discuss ablation    History of Present Illness: Mandy Serrano is a 42 y.o. female who is being seen today for the evaluation of PVCs at the request of Mandy Serrano. Presenting today for electrophysiology evaluation.  Has a history of obesity, hypertension, sickle cell, systolic heart failure diagnosed 05/2016, headaches, and frequent PVCs.  She is admitted 48 with dyspnea and hypertensive crisis.  She had not been taking his medications for a year.  She was found to have new acute systolic heart failure by echo with an EF of 25%.  Cardiac MRI showed no infiltrative or inflammatory process.  She high PVC burden and was thus started on amiodarone.  He has been taken off of her amiodarone and continues to have a high burden of PVCs.  She is not aware of her PVCs.  She has not had any heart failure symptoms since stopping the amiodarone.   Today, she denies symptoms of palpitations, chest pain, shortness of breath, orthopnea, PND, lower extremity edema, claudication, dizziness, presyncope, syncope, bleeding, or neurologic sequela. The patient is tolerating medications without difficulties.    Past Medical History:  Diagnosis Date  . Malignant hypertension    Past Surgical History:  Procedure Laterality Date  . CESAREAN SECTION       Current Outpatient Medications  Medication Sig Dispense Refill  . acetaminophen (TYLENOL) 500 MG tablet Take 500 mg by mouth every 6 (six) hours as needed for mild pain or headache.    Marland Kitchen amLODipine (NORVASC) 10 MG tablet Take 1 tablet (10 mg total) by mouth daily. 30 tablet 2  . ASPIR-LOW 81 MG EC tablet TAKE 1 TABLET BY MOUTH DAILY. 30 tablet 3  . carvedilol (COREG) 6.25  MG tablet Take 1 tablet (6.25 mg total) by mouth 2 (two) times daily. 60 tablet 2  . hydrALAZINE (APRESOLINE) 100 MG tablet Take 1 tablet (100 mg total) by mouth 3 (three) times daily. 90 tablet 2  . hydrochlorothiazide (HYDRODIURIL) 25 MG tablet Take 1 tablet (25 mg total) by mouth daily. 30 tablet 2  . losartan (COZAAR) 100 MG tablet TAKE 1 TABLET IN THE MORNING AND 1/2 TABLET IN THE EVENING 45 tablet 5  . spironolactone (ALDACTONE) 25 MG tablet Take 1 tablet (25 mg total) by mouth daily. 30 tablet 2  . flecainide (TAMBOCOR) 100 MG tablet Take 1 tablet (100 mg total) by mouth 2 (two) times daily. 60 tablet 3   No current facility-administered medications for this visit.     Allergies:   Patient has no known allergies.   Social History:  The patient  reports that  has never smoked. she has never used smokeless tobacco. She reports that she does not drink alcohol or use drugs.   Family History:  The patient's family history includes Hypertension in her father and mother.    ROS:  Please see the history of present illness.   Otherwise, review of systems is positive for none.   All other systems are reviewed and negative.    PHYSICAL EXAM: VS:  BP (!) 138/96   Pulse 74   Ht 5\' 5"  (1.651 m)   Wt 238 lb (108 kg)   BMI 39.61 kg/m  ,  BMI Body mass index is 39.61 kg/m. GEN: Well nourished, well developed, in no acute distress  HEENT: normal  Neck: no JVD, carotid bruits, or masses Cardiac: RRR; no murmurs, rubs, or gallops,no edema  Respiratory:  clear to auscultation bilaterally, normal work of breathing GI: soft, nontender, nondistended, + BS MS: no deformity or atrophy  Skin: warm and dry Neuro:  Strength and sensation are intact Psych: euthymic mood, full affect  EKG:  EKG is not ordered today. Personal review of the ekg ordered 11/12/17 shows sinus rhythm, left axis deviation  Recent Labs: 12/12/2016: BUN 19; Creat 1.22; Potassium 4.0; Sodium 134    Lipid Panel       Component Value Date/Time   CHOL 223 (H) 09/19/2016 1436   TRIG 104 09/19/2016 1436   HDL 51 09/19/2016 1436   CHOLHDL 4.4 09/19/2016 1436   VLDL 21 09/19/2016 1436   LDLCALC 151 (H) 09/19/2016 1436     Wt Readings from Last 3 Encounters:  11/16/17 238 lb (108 kg)  11/12/17 240 lb 12 oz (109.2 kg)  05/14/17 236 lb (107 kg)      Other studies Reviewed: Additional studies/ records that were reviewed today include: TTE 10/11/17  Review of the above records today demonstrates:  - Left ventricle: The cavity size was mildly dilated. There was   mild focal basal and mild concentric hypertrophy of the septum.   The estimated ejection fraction was 55%. Wall motion was normal;   there were no regional wall motion abnormalities. Doppler   parameters are consistent with abnormal left ventricular   relaxation (grade 1 diastolic dysfunction). - Aortic valve: There was trivial regurgitation. - Impressions: EF has recovered since previous echo. Small mobile   filamentous structure in LV apex likel ruptured chordae tendineae   (benign).  CMRI 06/14/16 1. Moderate left ventricular dilatation with moderate concentric hypertrophy and severely decreased systolic function (LVEF = 20%). There is diffuse hypokinesis. No late gadolinium enhancement was seen.  2. Normal right ventricular size, thickness and borderline systolic function (RVEF =44%, normal > 45%) with no regional wall motion abnormalities.  3. Mildly to moderately dilated left atrium. Normal size of the right atrium.  4. Mild to moderate mitral regurgitation, mild tricuspid regurgitation. Mild aortic regurgitation.  ASSESSMENT AND PLAN:  1.  PVCs: Had 16% while in the hospital on 05/2016 and has since been on amiodarone.  PVCs have been suppressed with EF recovery.  She has been taken off of amiodarone and PVCs are back.  KG, PVCs appear to be coming from the LV base.  I discussed with both her and her significant other the  risks and benefits of ablation.  At this time, she does not wish to have ablation performed and would prefer to be on medical therapy.  We Mandy Serrano thus plan to start her on 100 mg of flecainide.  We Mandy Serrano get a Holter monitor prior to the next visit to determine the effectiveness of flecainide.  2.  Chronic systolic heart failure due to nonischemic cardiomyopathy: Possibly due to PVCs.  Ejection fraction was 25% but has since recovered to 55%.  3.  Hypertension: Elevated today, but she is quite nervous in the office.  No changes at this time.  Current medicines are reviewed at length with the patient today.   The patient does not have concerns regarding her medicines.  The following changes were made today: Flecainide  Labs/ tests ordered today include:  Orders Placed This Encounter  Procedures  . Exercise  Tolerance Test  . Holter monitor - 48 hour   Case discussed with referring provider  Disposition:   FU with Kataleena Serrano 3 months  Signed, Asmar Brozek Mandy Loa, MD  11/16/2017 1:02 PM     [facility] [address] [place] [address] [phone] (office) [phone] (fax)

## 2017-11-16 NOTE — Patient Instructions (Addendum)
Medication Instructions:  Your physician has recommended you make the following change in your medication:  1. START Flecainide 100 mg twice daily -- YOU WILL START THIS MEDICATION 7-10 DAYS PRIOR TO YOUR STRESS TEST.  * If you need a refill on your cardiac medications before your next appointment, please call your pharmacy. *  Labwork: None ordered  Testing/Procedures: Your physician has requested that you have an exercise tolerance test in 2 weeks. For further information please visit [email or web address] Please also follow instruction sheet, as given.  Your physician has recommended that you wear a holter monitor in 3 months - before your follow up with Dr. Elberta Fortis. Holter monitors are medical devices that record the heart's electrical activity. Doctors most often use these monitors to diagnose arrhythmias. Arrhythmias are problems with the speed or rhythm of the heartbeat. The monitor is a small, portable device. You can wear one while you do your normal daily activities. This is usually used to diagnose what is causing palpitations/syncope (passing out).  Follow-Up: Your physician recommends that you schedule a follow-up appointment in: 3 months, after you have completed Holter monitor.  Thank you for choosing [facility]!!   Dory Horn, RN [phone]  Any Other Special Instructions Will Be Listed Below (If Applicable).   Cardiac Event Monitoring A cardiac event monitor is a small recording device that is used to detect abnormal heart rhythms (arrhythmias). The monitor is used to record your heart rhythm when you have symptoms, such as:  Fast heartbeats (palpitations), such as heart racing or fluttering.  Dizziness.  Fainting or light-headedness.  Unexplained weakness.  Some monitors are wired to electrodes placed on your chest. Electrodes are flat, sticky disks that attach to your skin. Other monitors may be hand-held or worn on the wrist. The monitor can be worn  for up to 30 days. If the monitor is attached to your chest, a technician will prepare your chest for the electrode placement and show you how to work the monitor. Take time to practice using the monitor before you leave the office. Make sure you understand how to send the information from the monitor to your health care provider. In some cases, you may need to use a landline telephone instead of a cell phone. What are the risks? Generally, this device is safe to use, but it possible that the skin under the electrodes will become irritated. How to use your cardiac event monitor  Wear your monitor at all times, except when you are in water: ? Do not let the monitor get wet. ? Take the monitor off when you bathe. Do not swim or use a hot tub with it on.  Keep your skin clean. Do not put body lotion or moisturizer on your chest.  Change the electrodes as told by your health care provider or any time they stop sticking to your skin. You may need to use medical tape to keep them on.  Try to put the electrodes in slightly different places on your chest to help prevent skin irritation. They must remain in the area under your left breast and in the upper right section of your chest.  Make sure the monitor is safely clipped to your clothing or in a location close to your body that your health care provider recommends.  Press the button to record as soon as you feel heart-related symptoms, such as: ? Dizziness. ? Weakness. ? Light-headedness. ? Palpitations. ? Thumping or pounding in your chest. ? Shortness of breath. ?  Unexplained weakness.  Keep a diary of your activities, such as walking, doing chores, and taking medicine. It is very important to note what you were doing when you pushed the button to record your symptoms. This will help your health care provider determine what might be contributing to your symptoms.  Send the recorded information as recommended by your health care provider. It  may take some time for your health care provider to process the results.  Change the batteries as told by your health care provider.  Keep electronic devices away from your monitor. This includes: ? Tablets. ? MP3 players. ? Cell phones.  While wearing your monitor you should avoid: ? Electric blankets. ? Firefighter. ? Electric toothbrushes. ? Microwave ovens. ? Magnets. ? Metal detectors. Get help right away if:  You have chest pain.  You have extreme difficulty breathing or shortness of breath.  You develop a very fast heartbeat that persists.  You develop dizziness that does not go away.  You faint or constantly feel like you are about to faint. Summary  A cardiac event monitor is a small recording device that is used to help detect abnormal heart rhythms (arrhythmias).  The monitor is used to record your heart rhythm when you have heart-related symptoms.  Make sure you understand how to send the information from the monitor to your health care provider.  It is important to press the button on the monitor when you have any heart-related symptoms.  Keep a diary of your activities, such as walking, doing chores, and taking medicine. It is very important to note what you were doing when you pushed the button to record your symptoms. This will help your health care provider learn what might be causing your symptoms. This information is not intended to replace advice given to you by your health care provider. Make sure you discuss any questions you have with your health care provider. Document Released: 07/18/2008 Document Revised: 09/23/2016 Document Reviewed: 09/23/2016 Elsevier Interactive Patient Education  2017 [employer].

## 2017-11-22 ENCOUNTER — Telehealth: Payer: Self-pay | Admitting: Cardiology

## 2017-11-22 NOTE — Telephone Encounter (Signed)
Mandy Serrano is calling to get clarification for taking her medication . Please call

## 2017-11-22 NOTE — Telephone Encounter (Signed)
Pt asking when she should take her Flecainide - advised to take at times (twice daily) that work for her.  Example given to patient is 7/8am and 7/8 pm.  Pt verbalized understanding and thanks me for explaining.

## 2017-11-29 ENCOUNTER — Other Ambulatory Visit: Payer: Self-pay | Admitting: Cardiology

## 2017-11-29 DIAGNOSIS — I428 Other cardiomyopathies: Secondary | ICD-10-CM

## 2017-11-29 DIAGNOSIS — Z79899 Other long term (current) drug therapy: Secondary | ICD-10-CM

## 2017-11-29 DIAGNOSIS — I493 Ventricular premature depolarization: Secondary | ICD-10-CM

## 2017-11-29 DIAGNOSIS — Z5181 Encounter for therapeutic drug level monitoring: Secondary | ICD-10-CM

## 2017-11-30 ENCOUNTER — Encounter: Payer: Self-pay | Admitting: Cardiology

## 2017-12-12 ENCOUNTER — Ambulatory Visit (INDEPENDENT_AMBULATORY_CARE_PROVIDER_SITE_OTHER): Payer: Self-pay

## 2017-12-12 DIAGNOSIS — I493 Ventricular premature depolarization: Secondary | ICD-10-CM

## 2017-12-12 DIAGNOSIS — Z5181 Encounter for therapeutic drug level monitoring: Secondary | ICD-10-CM

## 2017-12-12 DIAGNOSIS — I428 Other cardiomyopathies: Secondary | ICD-10-CM

## 2017-12-12 DIAGNOSIS — Z79899 Other long term (current) drug therapy: Secondary | ICD-10-CM

## 2017-12-12 LAB — EXERCISE TOLERANCE TEST
CHL CUP MPHR: 177 {beats}/min
CHL RATE OF PERCEIVED EXERTION: 15
CSEPED: 3 min
CSEPHR: 69 %
Estimated workload: 5.3 METS
Exercise duration (sec): 38 s
Peak HR: 123 {beats}/min
Rest HR: 71 {beats}/min

## 2017-12-13 ENCOUNTER — Encounter: Payer: Self-pay | Admitting: Cardiology

## 2017-12-13 MED FILL — AMLODIPINE BESYLATE 10 MG T: 10 | 34 days supply | Qty: 34 | Fill #1

## 2017-12-13 NOTE — Telephone Encounter (Signed)
New message     Patient is returning Sherri call

## 2017-12-13 NOTE — Telephone Encounter (Signed)
This encounter was created in error - please disregard.

## 2017-12-19 MED FILL — LOSARTAN POTASSIUM 100 MG T: 100 | 30 days supply | Qty: 45 | Fill #5

## 2017-12-19 MED FILL — HYDROCHLOROTHIAZIDE 25 MG T: 25 | 30 days supply | Qty: 30 | Fill #1

## 2017-12-24 MED FILL — SPIRONOLACTONE 25 MG TABLET: 25 | 34 days supply | Qty: 34 | Fill #1

## 2017-12-24 MED FILL — CARVEDILOL 6.25 MG TABLET: 6.25 | 34 days supply | Qty: 68 | Fill #1

## 2018-01-01 MED FILL — FLECAINIDE ACETATE 100 MG T: 100 | 30 days supply | Qty: 60 | Fill #1

## 2018-01-21 MED FILL — HYDROCHLOROTHIAZIDE 25 MG T: 25 | 30 days supply | Qty: 30 | Fill #2

## 2018-01-21 MED FILL — AMLODIPINE BESYLATE 10 MG T: 10 | 34 days supply | Qty: 34 | Fill #2

## 2018-01-28 MED FILL — LOSARTAN POTASSIUM 100 MG T: 100 | 30 days supply | Qty: 45 | Fill #0

## 2018-01-28 MED FILL — SPIRONOLACTONE 25 MG TABLET: 25 | 34 days supply | Qty: 34 | Fill #2

## 2018-02-05 ENCOUNTER — Ambulatory Visit (INDEPENDENT_AMBULATORY_CARE_PROVIDER_SITE_OTHER): Payer: No Typology Code available for payment source

## 2018-02-05 DIAGNOSIS — I493 Ventricular premature depolarization: Secondary | ICD-10-CM

## 2018-02-11 ENCOUNTER — Ambulatory Visit (HOSPITAL_COMMUNITY)
Admission: RE | Admit: 2018-02-11 | Discharge: 2018-02-11 | Disposition: A | Discharge status: Home / Self Care | Payer: No Typology Code available for payment source | Source: Ambulatory Visit | Attending: Internal Medicine | Admitting: Internal Medicine

## 2018-02-11 ENCOUNTER — Encounter (HOSPITAL_COMMUNITY): Payer: Self-pay

## 2018-02-11 VITALS — BP 152/104 | HR 70 | Wt 236.4 lb

## 2018-02-11 DIAGNOSIS — I493 Ventricular premature depolarization: Secondary | ICD-10-CM | POA: Insufficient documentation

## 2018-02-11 DIAGNOSIS — I110 Hypertensive heart disease with heart failure: Secondary | ICD-10-CM | POA: Insufficient documentation

## 2018-02-11 DIAGNOSIS — D571 Sickle-cell disease without crisis: Secondary | ICD-10-CM | POA: Insufficient documentation

## 2018-02-11 DIAGNOSIS — I428 Other cardiomyopathies: Secondary | ICD-10-CM

## 2018-02-11 DIAGNOSIS — I10 Essential (primary) hypertension: Secondary | ICD-10-CM

## 2018-02-11 DIAGNOSIS — Z79899 Other long term (current) drug therapy: Secondary | ICD-10-CM | POA: Insufficient documentation

## 2018-02-11 DIAGNOSIS — E669 Obesity, unspecified: Secondary | ICD-10-CM | POA: Insufficient documentation

## 2018-02-11 DIAGNOSIS — R51 Headache: Secondary | ICD-10-CM | POA: Insufficient documentation

## 2018-02-11 DIAGNOSIS — I4430 Unspecified atrioventricular block: Secondary | ICD-10-CM | POA: Insufficient documentation

## 2018-02-11 DIAGNOSIS — I5022 Chronic systolic (congestive) heart failure: Secondary | ICD-10-CM | POA: Insufficient documentation

## 2018-02-11 LAB — BASIC METABOLIC PANEL
ANION GAP: 9 (ref 5–15)
BUN: 18 mg/dL (ref 6–20)
CO2: 24 mmol/L (ref 22–32)
CREATININE: 1.12 mg/dL — AB (ref 0.44–1.00)
Calcium: 9.6 mg/dL (ref 8.9–10.3)
Chloride: 101 mmol/L (ref 101–111)
GFR, EST NON AFRICAN AMERICAN: 59 mL/min — AB (ref >60)
GLUCOSE: 97 mg/dL (ref 65–99)
Potassium: 3.6 mmol/L (ref 3.5–5.1)
Sodium: 134 mmol/L — ABNORMAL LOW (ref 135–145)

## 2018-02-11 NOTE — Patient Instructions (Signed)
Routine lab work today. Will notify you of abnormal results, otherwise no news is good news!  No changes to medication at this time.  EKG stable.  Follow up 3 months with Amy Clegg NP-C.  _____________________________________________________________ [place] Code: 1400  Take all medication as prescribed the day of your appointment. Bring all medications with you to your appointment.  Do the following things EVERYDAY: 1) Weigh yourself in the morning before breakfast. Write it down and keep it in a log. 2) Take your medicines as prescribed 3) Eat low salt foods-Limit salt (sodium) to 2000 mg per day.  4) Stay as active as you can everyday 5) Limit all fluids for the day to less than 2 liters

## 2018-02-11 NOTE — Progress Notes (Signed)
ADVANCED HF CLINIC NOTE  PCP: None  Primary Cardiologist: Dr Gala Romney   HPI Mandy Serrano is a 42 year old with history of obesity, HTN ( was on 5 anti-HTN medicines in [place]), sickle cell anemia, systolic heart failure 05/2016, headaches, and frequent PVCs.  Admitted 8/17 with increased dyspnea and HTN crisis. She had not been taking any medications over the last last year. New acute systolic heart identified on ECHO. LVEF ~25%. CMRI no infiltrative/inflamatory process and EF ~25%. Had high PVC burden so she was started on amio. TSH was normal. Unable to obtain Entresto because she is not [place] citizen. Discharge weight 225 pounds.   Echo 12/17 with EF 55%  Today she returns for HF follow up. Saw Dr Elberta Fortis and started on flecainide for frequent PVCs. Wore 48 hour holter monitor to quantify PVCs.  Overall feeling fine. Denies SOB/PND/Orthopnea. Appetite ok. No fever or chills. Weight at home 230-234 pounds. SBP at home has been 110-120 Taking all medications. She has difficulty paying for medications.    ROS: All systems negative except as listed in HPI, PMH and Problem List.  SH:  Social History   Socioeconomic History  . Marital status: Married    Spouse name: Not on file  . Number of children: Not on file  . Years of education: Not on file  . Highest education level: Not on file  Occupational History  . Not on file  Social Needs  . Financial resource strain: Not on file  . Food insecurity:    Worry: Not on file    Inability: Not on file  . Transportation needs:    Medical: Not on file    Non-medical: Not on file  Tobacco Use  . Smoking status: Never Smoker  . Smokeless tobacco: Never Used  Substance and Sexual Activity  . Alcohol use: No  . Drug use: No  . Sexual activity: Not on file  Lifestyle  . Physical activity:    Days per week: Not on file    Minutes per session: Not on file  . Stress: Not on file  Relationships  . Social connections:    Talks on phone:  Not on file    Gets together: Not on file    Attends religious service: Not on file    Active member of club or organization: Not on file    Attends meetings of clubs or organizations: Not on file    Relationship status: Not on file  . Intimate partner violence:    Fear of current or ex partner: Not on file    Emotionally abused: Not on file    Physically abused: Not on file    Forced sexual activity: Not on file  Other Topics Concern  . Not on file  Social History Narrative  . Not on file    FH:  Family History  Problem Relation Age of Onset  . Hypertension Mother   . Hypertension Father     Past Medical History:  Diagnosis Date  . Malignant hypertension     Current Outpatient Medications  Medication Sig Dispense Refill  . amLODipine (NORVASC) 10 MG tablet Take 1 tablet (10 mg total) by mouth daily. 30 tablet 2  . ASPIR-LOW 81 MG EC tablet TAKE 1 TABLET BY MOUTH DAILY. 30 tablet 3  . carvedilol (COREG) 6.25 MG tablet Take 1 tablet (6.25 mg total) by mouth 2 (two) times daily. 60 tablet 2  . flecainide (TAMBOCOR) 100 MG tablet Take 1 tablet (  100 mg total) by mouth 2 (two) times daily. 60 tablet 3  . hydrALAZINE (APRESOLINE) 100 MG tablet Take 1 tablet (100 mg total) by mouth 3 (three) times daily. 90 tablet 2  . hydrochlorothiazide (HYDRODIURIL) 25 MG tablet Take 1 tablet (25 mg total) by mouth daily. 30 tablet 2  . losartan (COZAAR) 100 MG tablet TAKE 1 TABLET IN THE MORNING AND 1/2 TABLET IN THE EVENING 45 tablet 5  . spironolactone (ALDACTONE) 25 MG tablet Take 1 tablet (25 mg total) by mouth daily. 30 tablet 2  . acetaminophen (TYLENOL) 500 MG tablet Take 500 mg by mouth every 6 (six) hours as needed for mild pain or headache.     No current facility-administered medications for this encounter.     Vitals:   02/11/18 0921  BP: (!) 152/104  Pulse: 70  SpO2: 99%  Weight: 236 lb 6.4 oz (107.2 kg)   PHYSICAL EXAM: General:  Well appearing. No resp difficulty.Walked  in the clinic without difficulty.  HEENT: normal Neck: supple. no JVD. Carotids 2+ bilat; no bruits. No lymphadenopathy or thryomegaly appreciated. Cor: PMI nondisplaced. Regular rate & rhythm. No rubs, gallops or murmurs. Lungs: clear Abdomen: soft, nontender, nondistended. No hepatosplenomegaly. No bruits or masses. Good bowel sounds. Extremities: no cyanosis, clubbing, rash, edema Neuro: alert & orientedx3, cranial nerves grossly intact. moves all 4 extremities w/o difficulty. Affect pleasant  EKG; SR 62 bpm PR 218 Mandy    ASSESSMENT & PLAN: 1. Chronic Systolic Heart Failure- NICM- ECHO 05/2016  LVEF ~25-30%. CMRI no infiltrative/inflamatory process and EF ~25%.  EF recovery noted 09/2017 with EF up to 55%.  NICM HTN  vs PVC induced - Volume status stable.  - Continue carvedilol 6.25 mg twice a day, hydralazine 100 mg twice a day, losartan 150 mg daily, and spiro 25 mg daily.  2. HTN Elevated in the clinic but stable at home. Continue current meds.  Suspect she has white coat.  3. PVCs -While in hospital had 16% PVCs in 8/17. Amio added. PVCs suppressed with EF recovery. Off amio and PVCs back.  Started on flecanide per EP. She declined ablation. Holter monitor results pending. EKG without PVCs.   Check BMET today. Follow up in  3-4 months.   Amy Clegg NP-C  9:25 AM

## 2018-02-14 MED FILL — CARVEDILOL 6.25 MG TABLET: 6.25 | 34 days supply | Qty: 68 | Fill #2

## 2018-02-14 MED FILL — FLECAINIDE ACETATE 100 MG T: 100 | 30 days supply | Qty: 60 | Fill #2

## 2018-02-18 ENCOUNTER — Ambulatory Visit: Payer: No Typology Code available for payment source | Admitting: Cardiology

## 2018-02-20 ENCOUNTER — Ambulatory Visit: Payer: No Typology Code available for payment source | Admitting: Cardiology

## 2018-02-21 IMAGING — MR MR CARD MORPHOLOGY WO/W CM
13 of 14 series · 15 of 16 positions shown · IV contrast (multihance)
Comparison: none

ADDENDUM:
There is no evidence of the left ventricular thrombus.

Makai Fellows
06/14/2016
CLINICAL DATA: 40-year-old female with hypertensive emergency and
new diagnosis of cardiomyopathy.
EXAM:
CARDIAC MRI
TECHNIQUE: The patient was scanned on a 1.5 Tesla GE magnet. A dedicated
cardiac coil was used. Functional imaging was done using Fiesta
sequences. [DATE], and 4 chamber views were done to assess for RWMA's.
Modified Marquel rule using a short axis stack was used to
calculate an ejection fraction on a dedicated work station using
Circle software. The patient received 24 cc of Multihance. After 10
minutes inversion recovery sequences were used to assess for
infiltration and scar tissue.
CONTRAST:  24 cc  of Multihance

[Series 3: bSSFP · sagittal · 8.0mm · 1.56mm/px · 1 of 14 slices shown (1 of 4)]
[im 1/14]
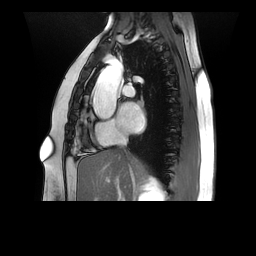

[Series 4: bSSFP · axial · 8.0mm · 1.56mm/px · 1 of 20 slices shown (2 of 4)]
[im 1/20]
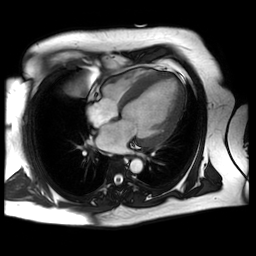

[Series 5: bSSFP · oblique · 8.0mm · 1.68mm/px · 3 of 360 slices shown (3 of 4)]
[im 1/360]
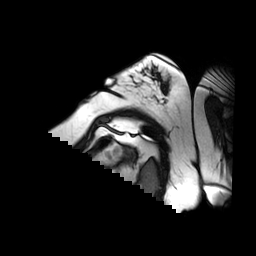
[im 180/360]
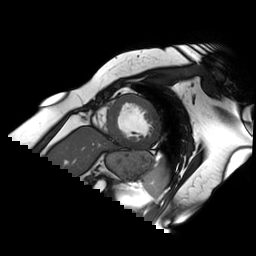
[im 360/360]
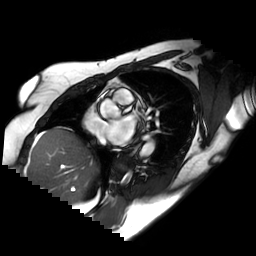

[Series 6: T2 · oblique · 8.0mm · 1.68mm/px · 1 of 60 slices shown (1 of 2)]
[im 1/60]
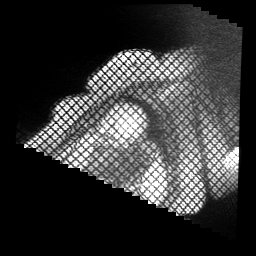

[Series 7: bSSFP · oblique · 8.0mm · 1.64mm/px · 1 of 60 slices shown (4 of 4)]
[im 1/60]
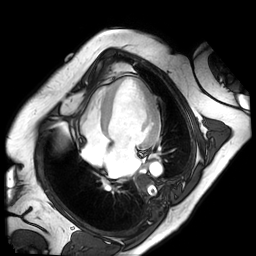

[Series 8: T2 · axial · 8.0mm · 1.68mm/px · 1 of 20 slices shown (2 of 2)]
[im 1/20]
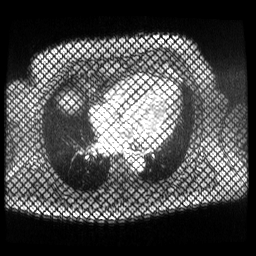

[Series 9: cine ir · oblique · 8.0mm · 1.68mm/px · 1 of 30 slices shown]
[im 1/30]
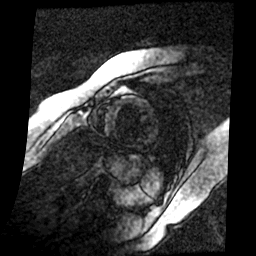

[Series 13: delayed ir prep · oblique · 8.0mm · 1.72mm/px · 1 of 8 slices shown (1 of 3)]
[im 1/8]
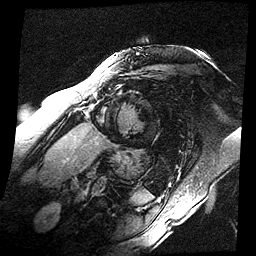

[Series 14: delayed ir prep · oblique · 8.0mm · 1.72mm/px · 1 of 6 slices shown (2 of 3)]
[im 1/6]
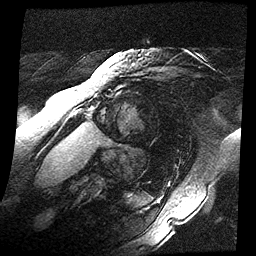

[Series 15: delayed ir prep · oblique · 8.0mm · 1.72mm/px · 1 of 13 slices shown (3 of 3)]
[im 1/13]
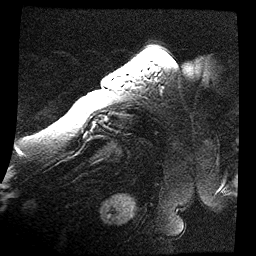

[Series 16: rad delayed ir · oblique · 8.0mm · 1.72mm/px · 1 of 3 slices shown]
[im 1/3]
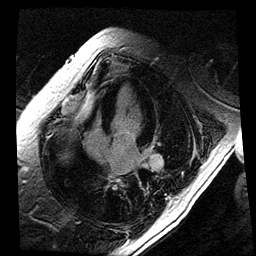

[Series 18: 2ch long ti · oblique · 8.0mm · 1.72mm/px · 1 of 3 slices shown]
[im 1/3]
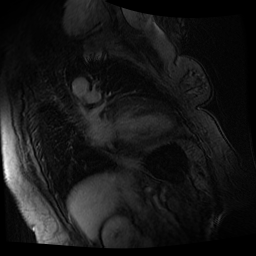

[Series 19: 4ch long ti · axial · 8.0mm · 1.72mm/px · 1 of 3 slices shown]
[im 1/3]
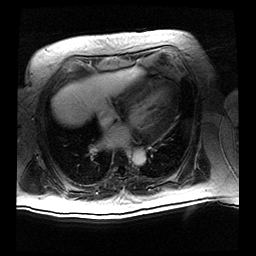

[15 of 16 positions shown; findings below may reference images not displayed]

FINDINGS: 1. Moderate left ventricular dilatation with moderate to severe
concentric hypertrophy and severely decreased systolic function
(LVEF = 20%). There is diffuse hypokinesis. No late gadolinium
enhancement was seen.

LVEDD:  68 mm

LVESD:  64 mm

Basal anteroseptal wall:  16 mm

Basal inferolateral wall:  13 mm

LVEDV:  261 ml

LVESV:  208 ml

SV:  53 ml

CO:  3.6 L/min

Myocardial mass:  301 g

2. Normal right ventricular size, thickness and borderline systolic
function (RVEF =44%, normal > 45%) with no regional wall motion
abnormalities.

3. Mildly to moderately dilated left atrium. Normal size of the
right atrium.

4. Mild to moderate mitral regurgitation, mild tricuspid
regurgitation. Mild aortic regurgitation.

5. Normal size of the aortic root, ascending aorta and pulmonary
artery.

6.  No pericardial effusion.
IMPRESSION: 1. Moderate left ventricular dilatation with moderate concentric
hypertrophy and severely decreased systolic function (LVEF = 20%).
There is diffuse hypokinesis. No late gadolinium enhancement was
seen.

2. Normal right ventricular size, thickness and borderline systolic
function (RVEF =44%, normal > 45%) with no regional wall motion
abnormalities.

3. Mildly to moderately dilated left atrium. Normal size of the
right atrium.

4. Mild to moderate mitral regurgitation, mild tricuspid
regurgitation. Mild aortic regurgitation.

Collectively, these findings are consistent with advanced stage of
hypertensive cardiomyopathy. There is no evidence of ischemic,
infiltrative or inflammatory cardiomyopathy.

Makai Fellows

## 2018-03-05 ENCOUNTER — Other Ambulatory Visit (HOSPITAL_COMMUNITY): Payer: Self-pay | Admitting: Internal Medicine

## 2018-03-06 MED FILL — HYDROCHLOROTHIAZIDE 25 MG T: 25 | 30 days supply | Qty: 30 | Fill #0

## 2018-03-06 MED FILL — AMLODIPINE BESYLATE 10 MG T: 10 | 34 days supply | Qty: 34 | Fill #0

## 2018-03-12 MED FILL — hydrALAZINE HCL 100 MG TABS: 100 | 34 days supply | Qty: 102 | Fill #1

## 2018-03-12 MED FILL — LOSARTAN POTASSIUM 100 MG T: 100 | 30 days supply | Qty: 45 | Fill #1

## 2018-03-12 MED FILL — SPIRONOLACTONE 25 MG TABLET: 25 | 30 days supply | Qty: 30 | Fill #3

## 2018-04-15 MED FILL — HYDROCHLOROTHIAZIDE 25 MG T: 25 | 30 days supply | Qty: 30 | Fill #1

## 2018-04-26 MED FILL — AMLODIPINE BESYLATE 10 MG T: 10 | 34 days supply | Qty: 34 | Fill #1

## 2018-04-26 MED FILL — FLECAINIDE ACETATE 100 MG T: 100 | 30 days supply | Qty: 60 | Fill #3

## 2018-04-26 MED FILL — SPIRONOLACTONE 25 MG TABLET: 25 | 30 days supply | Qty: 30 | Fill #4

## 2018-05-06 ENCOUNTER — Other Ambulatory Visit (HOSPITAL_COMMUNITY): Payer: Self-pay | Admitting: Internal Medicine

## 2018-05-06 MED FILL — LOSARTAN POTASSIUM 100 MG T: 100 | 30 days supply | Qty: 45 | Fill #2

## 2018-05-07 MED FILL — CARVEDILOL 6.25 MG TABLET: 6.25 | 30 days supply | Qty: 60 | Fill #0

## 2018-05-12 NOTE — Progress Notes (Signed)
ADVANCED HF CLINIC NOTE  PCP: None  Primary Cardiologist: Dr Gala Romney   HPI Mandy Serrano is a 42 year old with history of obesity, HTN ( was on 5 anti-HTN medicines in [place]), sickle cell anemia, systolic heart failure 05/2016, headaches, and frequent PVCs.  Admitted 8/17 with increased dyspnea and HTN crisis. She had not been taking any medications over the last last year. New acute systolic heart identified on ECHO. LVEF ~25%. CMRI no infiltrative/inflamatory process and EF ~25%. Had high PVC burden so she was started on amio. TSH was normal. Unable to obtain Entresto because she is not [place] citizen. Discharge weight 225 pounds.   Echo 12/17 with EF 55%  Today she returns for HF follow up. Overall doing well. She denies SOB, orthopnea, PND, or edema. She is able to walk around grocery store with no problems. She only gets SOB with running in place. She is doing youtube exercises daily and walks/runs 1 mile. No dizziness, CP, or palpitations. Mildly decreased appetite and energy level. She has a painful tooth that she would like to get extracted and is requesting a note for cardiac clearance. Taking all medications. Limits fluid and salt intake. Weights 235-240 lbs at home. SBP 120-130s at home.   Review of systems complete and found to be negative unless listed in HPI.   SH:  Social History   Socioeconomic History  . Marital status: Married    Spouse name: Not on file  . Number of children: Not on file  . Years of education: Not on file  . Highest education level: Not on file  Occupational History  . Not on file  Social Needs  . Financial resource strain: Not on file  . Food insecurity:    Worry: Not on file    Inability: Not on file  . Transportation needs:    Medical: Not on file    Non-medical: Not on file  Tobacco Use  . Smoking status: Never Smoker  . Smokeless tobacco: Never Used  Substance and Sexual Activity  . Alcohol use: No  . Drug use: No  . Sexual activity:  Not on file  Lifestyle  . Physical activity:    Days per week: Not on file    Minutes per session: Not on file  . Stress: Not on file  Relationships  . Social connections:    Talks on phone: Not on file    Gets together: Not on file    Attends religious service: Not on file    Active member of club or organization: Not on file    Attends meetings of clubs or organizations: Not on file    Relationship status: Not on file  . Intimate partner violence:    Fear of current or ex partner: Not on file    Emotionally abused: Not on file    Physically abused: Not on file    Forced sexual activity: Not on file  Other Topics Concern  . Not on file  Social History Narrative  . Not on file    FH:  Family History  Problem Relation Age of Onset  . Hypertension Mother   . Hypertension Father     Past Medical History:  Diagnosis Date  . Malignant hypertension     Current Outpatient Medications  Medication Sig Dispense Refill  . amLODipine (NORVASC) 10 MG tablet Take 1 tablet (10 mg total) by mouth daily. Ok to fill with HF fund until 04/2018 34 tablet 2  . ASPIR-LOW  81 MG EC tablet TAKE 1 TABLET BY MOUTH DAILY. 30 tablet 3  . carvedilol (COREG) 6.25 MG tablet TAKE 1 TABLET BY MOUTH TWICE DAILY 60 tablet 2  . flecainide (TAMBOCOR) 100 MG tablet Take 1 tablet (100 mg total) by mouth 2 (two) times daily. 60 tablet 3  . hydrALAZINE (APRESOLINE) 100 MG tablet Take 1 tablet (100 mg total) by mouth 3 (three) times daily. 90 tablet 2  . hydrochlorothiazide (HYDRODIURIL) 25 MG tablet TAKE 1 TABLET BY MOUTH DAILY 30 tablet 2  . losartan (COZAAR) 100 MG tablet TAKE 1 TABLET IN THE MORNING AND 1/2 TABLET IN THE EVENING 45 tablet 5  . spironolactone (ALDACTONE) 25 MG tablet Take 1 tablet (25 mg total) by mouth daily. 30 tablet 2  . acetaminophen (TYLENOL) 500 MG tablet Take 500 mg by mouth every 6 (six) hours as needed for mild pain or headache.     No current facility-administered medications for  this encounter.     Vitals:   05/13/18 0948  BP: (!) 148/100  Pulse: 81  SpO2: 99%  Weight: 232 lb 9.6 oz (105.5 kg)    Wt Readings from Last 3 Encounters:  05/13/18 232 lb 9.6 oz (105.5 kg)  02/11/18 236 lb 6.4 oz (107.2 kg)  11/16/17 238 lb (108 kg)    PHYSICAL EXAM: General: Well appearing. No resp difficulty. Appears nervous. HEENT: Normal Neck: Supple. JVP 5-6. Carotids 2+ bilat; no bruits. No thyromegaly or nodule noted. Cor: PMI nondisplaced. RRR, No M/G/R noted Lungs: CTAB, normal effort. Abdomen: Soft, non-tender, non-distended, no HSM. No bruits or masses. +BS  Extremities: No cyanosis, clubbing, or rash. R and LLE no edema.  Neuro: Alert & orientedx3, cranial nerves grossly intact. moves all 4 extremities w/o difficulty. Affect pleasant   ASSESSMENT & PLAN: 1. Chronic Systolic Heart Failure- NICM- ECHO 05/2016  LVEF ~25-30%. CMRI no infiltrative/inflamatory process and EF ~25%.  EF recovery noted 09/2016 with EF up to 55%.  NICM HTN  vs PVC induced - NYHA class II - Volume status stable on exam.  - Continue carvedilol 6.25 mg twice a day. Will not increase today with decreased appetite and energy level/recovery of EF - Continue hydralazine 100 mg TID - Continue losartan 150 mg daily,  - Continue spiro 25 mg daily.  - Repeat Echo. She is requesting dental clearance for extraction (has not made appointment yet) and will need a note if EF remains normal.  2. HTN - Elevated today, but SBP 120-130s at home. Suspect she has white coat HTN. She checks 2x/day and was instructed to let us know if SBP >140 consistently 3. PVCs -While in hospital had 16% PVCs in 8/17. Amio added. PVCs suppressed with EF recovery. Off amio and PVCs back.  Started on flecanide per EP. She declined ablation.  - Continue flecanide. Follows with Dr Elberta Fortis. She has declined ablation.  - Holter monitor 01/2018 with rare PVCs on flecainide (0.49%)  BMET today  Repeat echo. She will need a note  for dental extraction if EF remains normal.  Follow up 3-4 months with APP  Alford Highland, NP 10:08 AM  Greater than 50% of the 25 minute visit was spent in counseling/coordination of care regarding disease state education, salt/fluid restriction, sliding scale diuretics, and medication compliance.

## 2018-05-13 ENCOUNTER — Ambulatory Visit (HOSPITAL_COMMUNITY)
Admission: RE | Admit: 2018-05-13 | Discharge: 2018-05-13 | Disposition: A | Discharge status: Home / Self Care | Payer: No Typology Code available for payment source | Source: Ambulatory Visit | Attending: Internal Medicine | Admitting: Internal Medicine

## 2018-05-13 ENCOUNTER — Encounter (HOSPITAL_COMMUNITY): Payer: Self-pay

## 2018-05-13 VITALS — BP 148/100 | HR 81 | Wt 232.6 lb

## 2018-05-13 DIAGNOSIS — I429 Cardiomyopathy, unspecified: Secondary | ICD-10-CM | POA: Insufficient documentation

## 2018-05-13 DIAGNOSIS — I110 Hypertensive heart disease with heart failure: Secondary | ICD-10-CM | POA: Insufficient documentation

## 2018-05-13 DIAGNOSIS — I10 Essential (primary) hypertension: Secondary | ICD-10-CM

## 2018-05-13 DIAGNOSIS — Z79899 Other long term (current) drug therapy: Secondary | ICD-10-CM | POA: Insufficient documentation

## 2018-05-13 DIAGNOSIS — I5022 Chronic systolic (congestive) heart failure: Secondary | ICD-10-CM | POA: Insufficient documentation

## 2018-05-13 DIAGNOSIS — I493 Ventricular premature depolarization: Secondary | ICD-10-CM | POA: Insufficient documentation

## 2018-05-13 DIAGNOSIS — Z6838 Body mass index (BMI) 38.0-38.9, adult: Secondary | ICD-10-CM | POA: Insufficient documentation

## 2018-05-13 DIAGNOSIS — E669 Obesity, unspecified: Secondary | ICD-10-CM | POA: Insufficient documentation

## 2018-05-13 LAB — BASIC METABOLIC PANEL
ANION GAP: 9 (ref 5–15)
BUN: 25 mg/dL — AB (ref 6–20)
CHLORIDE: 103 mmol/L (ref 98–111)
CO2: 25 mmol/L (ref 22–32)
Calcium: 10 mg/dL (ref 8.9–10.3)
Creatinine, Ser: 1.14 mg/dL — ABNORMAL HIGH (ref 0.44–1.00)
GFR calc Af Amer: >60 mL/min (ref >60)
GFR, EST NON AFRICAN AMERICAN: 58 mL/min — AB (ref >60)
GLUCOSE: 80 mg/dL (ref 70–99)
POTASSIUM: 4.1 mmol/L (ref 3.5–5.1)
Sodium: 137 mmol/L (ref 135–145)

## 2018-05-13 NOTE — Patient Instructions (Signed)
Routine lab work today. Will notify you of abnormal results, otherwise no news is good news!  Will schedule you for an echocardiogram at [facility]. Address: [address] ([address] Floor), [place], [place] [address]  Phone: [phone] Their office will call you to schedule.  Follow up 3-4 months.  _________________________________________________________________ [place] Code:  Take all medication as prescribed the day of your appointment. Bring all medications with you to your appointment.  Do the following things EVERYDAY: 1) Weigh yourself in the morning before breakfast. Write it down and keep it in a log. 2) Take your medicines as prescribed 3) Eat low salt foods-Limit salt (sodium) to 2000 mg per day.  4) Stay as active as you can everyday 5) Limit all fluids for the day to less than 2 liters

## 2018-05-15 MED FILL — hydrALAZINE HCL 100 MG TABS: 100 | 34 days supply | Qty: 102 | Fill #2

## 2018-05-15 MED FILL — HYDROCHLOROTHIAZIDE 25 MG T: 25 | 30 days supply | Qty: 30 | Fill #2

## 2018-05-17 ENCOUNTER — Ambulatory Visit (HOSPITAL_COMMUNITY): Payer: No Typology Code available for payment source | Attending: Cardiovascular Disease

## 2018-05-17 ENCOUNTER — Encounter (HOSPITAL_COMMUNITY): Payer: Self-pay | Admitting: Cardiology

## 2018-05-17 ENCOUNTER — Other Ambulatory Visit: Payer: Self-pay

## 2018-05-17 DIAGNOSIS — I110 Hypertensive heart disease with heart failure: Secondary | ICD-10-CM | POA: Insufficient documentation

## 2018-05-17 DIAGNOSIS — I5022 Chronic systolic (congestive) heart failure: Secondary | ICD-10-CM | POA: Insufficient documentation

## 2018-05-17 DIAGNOSIS — D571 Sickle-cell disease without crisis: Secondary | ICD-10-CM | POA: Insufficient documentation

## 2018-05-17 DIAGNOSIS — I080 Rheumatic disorders of both mitral and aortic valves: Secondary | ICD-10-CM | POA: Insufficient documentation

## 2018-05-17 DIAGNOSIS — I493 Ventricular premature depolarization: Secondary | ICD-10-CM | POA: Insufficient documentation

## 2018-05-17 DIAGNOSIS — Z6838 Body mass index (BMI) 38.0-38.9, adult: Secondary | ICD-10-CM | POA: Insufficient documentation

## 2018-05-27 MED FILL — AMLODIPINE BESYLATE 10 MG T: 10 | 34 days supply | Qty: 34 | Fill #2

## 2018-05-28 ENCOUNTER — Telehealth (HOSPITAL_COMMUNITY): Payer: Self-pay

## 2018-05-28 NOTE — Telephone Encounter (Signed)
Result Notes for ECHOCARDIOGRAM COMPLETE   Notes recorded by Chyrl Civatte, RN on 05/28/2018 at 2:31 PM EDT LVMTCB. ------  Notes recorded by Alford Highland, NP on 05/17/2018 at 10:10 AM EDT EF remains normal, 60-65%. She had requested a note for dental extraction. Okayed per Dr Shirlee Latch as long as EF remained normal. Can you provide a note for her? Thanks

## 2018-05-29 MED FILL — SPIRONOLACTONE 25 MG TABLET: 25 | 30 days supply | Qty: 30 | Fill #5

## 2018-06-20 ENCOUNTER — Other Ambulatory Visit (HOSPITAL_COMMUNITY): Payer: Self-pay | Admitting: Internal Medicine

## 2018-06-21 ENCOUNTER — Other Ambulatory Visit (HOSPITAL_COMMUNITY): Payer: Self-pay

## 2018-06-21 MED ORDER — HYDROCHLOROTHIAZIDE 25 MG PO TABS: 25.0000 mg | ORAL_TABLET | Freq: Every day | ORAL | 10 refills | Status: DC

## 2018-06-21 MED FILL — HYDROCHLOROTHIAZIDE 25 MG T: 25 | 30 days supply | Qty: 30 | Fill #0

## 2018-06-25 ENCOUNTER — Other Ambulatory Visit (HOSPITAL_COMMUNITY): Payer: Self-pay | Admitting: Internal Medicine

## 2018-06-25 ENCOUNTER — Other Ambulatory Visit: Payer: Self-pay | Admitting: Cardiology

## 2018-06-25 MED FILL — FLECAINIDE ACETATE 100 MG T: 100 | 30 days supply | Qty: 60 | Fill #0

## 2018-06-25 MED FILL — LOSARTAN POTASSIUM 100 MG T: 100 | 30 days supply | Qty: 45 | Fill #0

## 2018-07-02 ENCOUNTER — Other Ambulatory Visit (HOSPITAL_COMMUNITY): Payer: Self-pay | Admitting: Internal Medicine

## 2018-07-02 MED FILL — CARVEDILOL 6.25 MG TABLET: 6.25 | 30 days supply | Qty: 60 | Fill #1

## 2018-07-03 ENCOUNTER — Other Ambulatory Visit (HOSPITAL_COMMUNITY): Payer: Self-pay | Admitting: Internal Medicine

## 2018-07-05 ENCOUNTER — Other Ambulatory Visit (HOSPITAL_COMMUNITY): Payer: Self-pay

## 2018-07-05 MED ORDER — SPIRONOLACTONE 25 MG PO TABS: 25.0000 mg | ORAL_TABLET | Freq: Every day | ORAL | 5 refills | Status: DC

## 2018-07-05 MED FILL — SPIRONOLACTONE 25 MG TABLET: 25 | 30 days supply | Qty: 30 | Fill #0

## 2018-07-16 ENCOUNTER — Other Ambulatory Visit (HOSPITAL_COMMUNITY): Payer: Self-pay | Admitting: Internal Medicine

## 2018-07-17 MED FILL — AMLODIPINE BESYLATE 10 MG T: 10 | 30 days supply | Qty: 30 | Fill #0

## 2018-08-05 MED FILL — HYDROCHLOROTHIAZIDE 25 MG T: 25 | 30 days supply | Qty: 30 | Fill #1

## 2018-08-12 MED FILL — LOSARTAN POTASSIUM 100 MG T: 100 | 30 days supply | Qty: 45 | Fill #1

## 2018-08-12 MED FILL — SPIRONOLACTONE 25 MG TABLET: 25 | 30 days supply | Qty: 30 | Fill #1

## 2018-08-16 ENCOUNTER — Telehealth (HOSPITAL_COMMUNITY): Payer: Self-pay | Admitting: Licensed Clinical Social Worker

## 2018-08-16 NOTE — Telephone Encounter (Signed)
CSW called pt to inquire about current insurance status.  Pt confirms she is currently uninsured- had applied for medicaid last year but was denied.  Pt states she is currently working a very small amount but mainly depends on her spouse.  CSW discussed upcoming open enrollment through [employer].  CSW provided pt with instructions on how to apply by phone as pt does not have internet access.  CSW will continue to follow in clinic and assist as needed  Burna Sis, LCSW Clinical Social Worker [facility] [phone]

## 2018-08-26 MED FILL — AMLODIPINE BESYLATE 10 MG T: 10 | 30 days supply | Qty: 30 | Fill #1

## 2018-09-03 MED FILL — FLECAINIDE ACETATE 100 MG T: 100 | 30 days supply | Qty: 60 | Fill #1

## 2018-09-03 MED FILL — CARVEDILOL 6.25 MG TABLET: 6.25 | 30 days supply | Qty: 60 | Fill #2

## 2018-09-03 MED FILL — HYDROCHLOROTHIAZIDE 25 MG T: 25 | 30 days supply | Qty: 30 | Fill #2

## 2018-09-16 NOTE — Progress Notes (Signed)
ADVANCED HF CLINIC NOTE  PCP: None  Primary Cardiologist: Dr Haroldine Laws   HPI Mandy Serrano is a 41 y.o. with history of obesity, HTN ( was on 5 anti-HTN medicines in [place]), sickle cell anemia, systolic heart failure 11/9189, headaches, and frequent PVCs.  Admitted 8/17 with increased dyspnea and HTN crisis. She had not been taking any medications over the last last year. New acute systolic heart identified on ECHO. LVEF ~25%. CMRI no infiltrative/inflamatory process and EF ~25%. Had high PVC burden so she was started on amio. TSH was normal. Unable to obtain Entresto because she is not [place] citizen. Discharge weight 225 pounds.   She returns today for HF follow up. Last visit repeat echo was scheduled and showed EF 60-65%. Overall doing well. Denies SOB, orthopnea, PND, or edema. Working out daily on treadmill. No CP or palpitations. Rare, mild dizziness with standing up quickly. Has some left sided mouth pain, but has been following with her dentist. Has not seen PCP in a while. Needs help renewing orange card. Weights down 5 lbs at home. She has been dieting and exercising. Limits how much she eats. SBP 130-140s on home checks. Taking all medications   ECHO 05/2016  LVEF ~25-30%.  CMRI no infiltrative/inflamatory process and EF ~25%.  Echo 12/17 with EF 55% Echo 7/19 EF 60-65%  Review of systems complete and found to be negative unless listed in HPI.   SH:  Social History   Socioeconomic History  . Marital status: Married    Spouse name: Not on file  . Number of children: Not on file  . Years of education: Not on file  . Highest education level: Not on file  Occupational History  . Not on file  Social Needs  . Financial resource strain: Not on file  . Food insecurity:    Worry: Not on file    Inability: Not on file  . Transportation needs:    Medical: Not on file    Non-medical: Not on file  Tobacco Use  . Smoking status: Never Smoker  . Smokeless tobacco: Never Used    Substance and Sexual Activity  . Alcohol use: No  . Drug use: No  . Sexual activity: Not on file  Lifestyle  . Physical activity:    Days per week: Not on file    Minutes per session: Not on file  . Stress: Not on file  Relationships  . Social connections:    Talks on phone: Not on file    Gets together: Not on file    Attends religious service: Not on file    Active member of club or organization: Not on file    Attends meetings of clubs or organizations: Not on file    Relationship status: Not on file  . Intimate partner violence:    Fear of current or ex partner: Not on file    Emotionally abused: Not on file    Physically abused: Not on file    Forced sexual activity: Not on file  Other Topics Concern  . Not on file  Social History Narrative  . Not on file    FH:  Family History  Problem Relation Age of Onset  . Hypertension Mother   . Hypertension Father     Past Medical History:  Diagnosis Date  . Malignant hypertension     Current Outpatient Medications  Medication Sig Dispense Refill  . acetaminophen (TYLENOL) 500 MG tablet Take 500 mg by mouth every 6 (  six) hours as needed for mild pain or headache.    Marland Kitchen amLODipine (NORVASC) 10 MG tablet TAKE 1 TABLET (10 MG TOTAL) BY MOUTH DAILY 30 tablet 5  . ASPIR-LOW 81 MG EC tablet TAKE 1 TABLET BY MOUTH DAILY. 30 tablet 3  . carvedilol (COREG) 6.25 MG tablet TAKE 1 TABLET BY MOUTH TWICE DAILY 60 tablet 2  . flecainide (TAMBOCOR) 100 MG tablet Take 1 tablet (100 mg total) by mouth 2 (two) times daily. Please make yearly appt with Dr. Curt Bears for January. 1st attempt 60 tablet 3  . hydrALAZINE (APRESOLINE) 100 MG tablet Take 1 tablet (100 mg total) by mouth 3 (three) times daily. 90 tablet 2  . hydrochlorothiazide (HYDRODIURIL) 25 MG tablet Take 1 tablet (25 mg total) by mouth daily. 30 tablet 10  . losartan (COZAAR) 100 MG tablet TAKE 1 TABLET BY MOUTH BY MOUTH EVERY MORNING, AND 1/2 TABLET EVERY EVENING 45 tablet 5   . spironolactone (ALDACTONE) 25 MG tablet TAKE 1 TABLET BY MOUTH DAILY. 30 tablet 5  . spironolactone (ALDACTONE) 25 MG tablet Take 1 tablet (25 mg total) by mouth daily. 30 tablet 5   No current facility-administered medications for this encounter.     Vitals:   09/17/18 1037  BP: (!) 162/104  Pulse: 93  SpO2: 93%  Weight: 103.2 kg (227 lb 9.6 oz)    Wt Readings from Last 3 Encounters:  09/17/18 103.2 kg (227 lb 9.6 oz)  05/13/18 105.5 kg (232 lb 9.6 oz)  02/11/18 107.2 kg (236 lb 6.4 oz)    PHYSICAL EXAM: General: Well appearing. No resp difficulty. HEENT: Normal Neck: Supple. JVP flat. Carotids 2+ bilat; no bruits. No thyromegaly or nodule noted. Cor: PMI nondisplaced. RRR, No M/G/R noted Lungs: CTAB, normal effort. Abdomen: Soft, non-tender, non-distended, no HSM. No bruits or masses. +BS  Extremities: No cyanosis, clubbing, or rash. R and LLE no edema.  Neuro: Alert & orientedx3, cranial nerves grossly intact. moves all 4 extremities w/o difficulty. Affect pleasant  EKG: NSR 91 bpm, left axis deviation. Personally reviewed  ASSESSMENT & PLAN: 1. Chronic Systolic Heart Failure- NICM- HTN  vs PVC induced. ECHO 05/2016  LVEF ~25-30%. CMRI no infiltrative/inflamatory process and EF ~25%.  EF recovery noted 09/2016 with EF up to 55%.  - Echo 04/2018: EF 60-65% - NYHA class II - Volume status stable. Does not need lasix.  - Increase coreg to 12.5 mg BID. (for BP) - Continue hydralazine 100 mg TID - Continue losartan 150 mg daily - Continue spiro 25 mg daily. BMET today 2. HTN - Elevated today. Running 130-140s at home. - Increase coreg to 12.5 mg BID - Continue amlodipine 10 mg daily and HCTZ 25 mg daily in addition to HF medications. 3. PVCs - While in hospital had 16% PVCs in 8/17. Amio added. PVCs suppressed with EF recovery. Off amio and PVCs back.  Started on flecanide per EP. She declined ablation.  - Continue flecanide. Follows with Dr Curt Bears. She has declined  ablation. Encouraged her to schedule follow up with EP.  - Holter monitor 01/2018 with rare PVCs on flecainide (0.49%) - EKG today shows NSR with no PVCs  CSW met with her to discuss renewing orange card BMET today Increase coreg as above Provided note to allow dental procedure with normal EF Follow up in 6 months with Dr Haroldine Laws  Georgiana Shore, NP 10:52 AM  Greater than 50% of the 25 minute visit was spent in counseling/coordination of care regarding disease  state education, salt/fluid restriction, sliding scale diuretics, and medication compliance.

## 2018-09-17 ENCOUNTER — Ambulatory Visit (HOSPITAL_COMMUNITY)
Admission: RE | Admit: 2018-09-17 | Discharge: 2018-09-17 | Disposition: A | Discharge status: Home / Self Care | Payer: No Typology Code available for payment source | Source: Ambulatory Visit | Attending: Internal Medicine | Admitting: Internal Medicine

## 2018-09-17 ENCOUNTER — Encounter (HOSPITAL_COMMUNITY): Payer: Self-pay

## 2018-09-17 VITALS — BP 162/104 | HR 93 | Wt 227.6 lb

## 2018-09-17 DIAGNOSIS — I10 Essential (primary) hypertension: Secondary | ICD-10-CM

## 2018-09-17 DIAGNOSIS — E669 Obesity, unspecified: Secondary | ICD-10-CM | POA: Insufficient documentation

## 2018-09-17 DIAGNOSIS — D571 Sickle-cell disease without crisis: Secondary | ICD-10-CM | POA: Insufficient documentation

## 2018-09-17 DIAGNOSIS — I493 Ventricular premature depolarization: Secondary | ICD-10-CM | POA: Insufficient documentation

## 2018-09-17 DIAGNOSIS — Z8249 Family history of ischemic heart disease and other diseases of the circulatory system: Secondary | ICD-10-CM | POA: Insufficient documentation

## 2018-09-17 DIAGNOSIS — I5022 Chronic systolic (congestive) heart failure: Secondary | ICD-10-CM | POA: Insufficient documentation

## 2018-09-17 DIAGNOSIS — I110 Hypertensive heart disease with heart failure: Secondary | ICD-10-CM | POA: Insufficient documentation

## 2018-09-17 DIAGNOSIS — Z79899 Other long term (current) drug therapy: Secondary | ICD-10-CM | POA: Insufficient documentation

## 2018-09-17 LAB — BASIC METABOLIC PANEL
Anion gap: 9 (ref 5–15)
BUN: 14 mg/dL (ref 6–20)
CHLORIDE: 101 mmol/L (ref 98–111)
CO2: 25 mmol/L (ref 22–32)
Calcium: 9.5 mg/dL (ref 8.9–10.3)
Creatinine, Ser: 1.14 mg/dL — ABNORMAL HIGH (ref 0.44–1.00)
GFR calc Af Amer: >60 mL/min (ref >60)
GFR, EST NON AFRICAN AMERICAN: 58 mL/min — AB (ref >60)
Glucose, Bld: 88 mg/dL (ref 70–99)
POTASSIUM: 3.5 mmol/L (ref 3.5–5.1)
Sodium: 135 mmol/L (ref 135–145)

## 2018-09-17 MED ORDER — CARVEDILOL 12.5 MG PO TABS: 12.5000 mg | ORAL_TABLET | Freq: Two times a day (BID) | ORAL | 6 refills | Status: DC

## 2018-09-17 NOTE — Patient Instructions (Addendum)
INCREASE Coreg 12.5mg  twice a day  Labs today We will only contact you if something comes back abnormal or we need to make some changes. Otherwise no news is good news!  Your physician recommends that you schedule a follow-up appointment in: 6 months. Please call office in [facility] to schedule your appointment.

## 2018-09-17 NOTE — Progress Notes (Signed)
CSW consulted to assist pt with dental services.  Pt reports she has had dental pain over the past couple of months but has been unable to see the dentist because she is currently uninsured.  Pt reports that she had the [facility] but that it recently expired and she has been unable to get to DSS to get it renewed.  CSW discussed importance of renewing this so she can visit dentist through the [employer].  Only other option would be to go to [facility] dental clinic- CSW submitted pt name to this program but was informed they will not have any dental check up appts until after January- pt informed of this  CSW will continue to follow in clinic and assist as needed  Burna Sis, LCSW Clinical Social Worker [facility] [phone]

## 2018-09-20 MED FILL — SPIRONOLACTONE 25 MG TABLET: 25 | 30 days supply | Qty: 30 | Fill #2

## 2018-09-24 MED FILL — CARVEDILOL 12.5 MG TABLET: 12.5 | 30 days supply | Qty: 60 | Fill #0

## 2018-09-30 MED FILL — LOSARTAN POTASSIUM 100 MG T: 100 | 30 days supply | Qty: 45 | Fill #2

## 2018-09-30 MED FILL — AMLODIPINE BESYLATE 10 MG T: 10 | 30 days supply | Qty: 30 | Fill #2

## 2018-10-21 ENCOUNTER — Other Ambulatory Visit (HOSPITAL_COMMUNITY): Payer: Self-pay | Admitting: Internal Medicine

## 2018-10-21 MED ORDER — HYDRALAZINE HCL 100 MG PO TABS: 100.0000 mg | ORAL_TABLET | Freq: Three times a day (TID) | ORAL | 2 refills | Status: DC

## 2018-10-21 MED FILL — hydrALAZINE HCL 100 MG TABS: 100 | 34 days supply | Qty: 102 | Fill #0

## 2018-10-21 MED FILL — HYDROCHLOROTHIAZIDE 25 MG T: 25 | 30 days supply | Qty: 30 | Fill #3

## 2018-10-21 NOTE — Addendum Note (Signed)
Addended by: Beryl Hornberger, Milagros Reap on: 10/21/2018 11:38 AM   Modules accepted: Orders

## 2018-10-31 MED FILL — SPIRONOLACTONE 25 MG TABLET: 25 | 30 days supply | Qty: 30 | Fill #3

## 2018-11-11 MED FILL — FLECAINIDE ACETATE 100 MG T: 100 | 30 days supply | Qty: 60 | Fill #2

## 2018-11-11 MED FILL — AMLODIPINE BESYLATE 10 MG T: 10 | 30 days supply | Qty: 30 | Fill #3

## 2018-12-02 MED FILL — LOSARTAN POTASSIUM 100 MG T: 100 | 30 days supply | Qty: 45 | Fill #3 | Status: TO

## 2018-12-02 MED FILL — HYDROCHLOROTHIAZIDE 25 MG T: 25 | 30 days supply | Qty: 30 | Fill #4

## 2018-12-11 ENCOUNTER — Other Ambulatory Visit (HOSPITAL_COMMUNITY): Payer: Self-pay | Admitting: Internal Medicine

## 2018-12-11 MED FILL — SPIRONOLACTONE 25 MG TABLET: 25 | 30 days supply | Qty: 30 | Fill #4 | Status: TO

## 2018-12-13 ENCOUNTER — Other Ambulatory Visit (HOSPITAL_COMMUNITY): Payer: Self-pay

## 2018-12-13 DIAGNOSIS — I5022 Chronic systolic (congestive) heart failure: Secondary | ICD-10-CM

## 2018-12-13 MED ORDER — CARVEDILOL 12.5 MG PO TABS: 12.5000 mg | ORAL_TABLET | Freq: Two times a day (BID) | ORAL | 6 refills | Status: DC

## 2018-12-13 MED FILL — CARVEDILOL 12.5 MG TABLET: 12.5 | 30 days supply | Qty: 60 | Fill #0

## 2018-12-23 MED FILL — AMLODIPINE BESYLATE 10 MG T: 10 | 30 days supply | Qty: 30 | Fill #4 | Status: TO

## 2019-01-11 MED FILL — HYDROCHLOROTHIAZIDE 25 MG T: 25 | 30 days supply | Qty: 30 | Fill #5 | Status: TO

## 2019-01-24 ENCOUNTER — Other Ambulatory Visit (HOSPITAL_COMMUNITY): Payer: Self-pay | Admitting: *Deleted

## 2019-01-27 MED FILL — FLECAINIDE ACETATE 100 MG T: 100 | 30 days supply | Qty: 60 | Fill #0

## 2019-01-27 MED FILL — AMLODIPINE BESYLATE 10 MG T: 10 | 30 days supply | Qty: 30 | Fill #0

## 2019-01-27 MED FILL — LOSARTAN POTASSIUM 100 MG T: 100 | 30 days supply | Qty: 45 | Fill #0 | Status: TO

## 2019-01-27 MED FILL — SPIRONOLACTONE 25 MG TABS: 25 | 30 days supply | Qty: 30 | Fill #0

## 2019-02-03 MED FILL — hydrALAZINE HCL 100 MG TABS: 100 | 34 days supply | Qty: 102 | Fill #0

## 2019-02-17 MED FILL — HYDROCHLOROTHIAZIDE 25 MG T: 25 | 30 days supply | Qty: 30 | Fill #0

## 2019-03-06 ENCOUNTER — Other Ambulatory Visit (HOSPITAL_COMMUNITY): Payer: Self-pay | Admitting: Internal Medicine

## 2019-03-06 MED FILL — SPIRONOLACTONE 25 MG TABS: 25 | 30 days supply | Qty: 30 | Fill #0

## 2019-03-06 MED FILL — AMLODIPINE BESYLATE 10 MG T: 10 | 30 days supply | Qty: 30 | Fill #0

## 2019-03-19 ENCOUNTER — Encounter (HOSPITAL_COMMUNITY): Payer: No Typology Code available for payment source | Admitting: Internal Medicine

## 2019-03-27 MED FILL — LOSARTAN POTASSIUM 100 MG T: 100 | 30 days supply | Qty: 45 | Fill #0

## 2019-04-09 ENCOUNTER — Other Ambulatory Visit: Payer: Self-pay | Admitting: Cardiology

## 2019-04-09 MED FILL — HYDROCHLOROTHIAZIDE 25 MG T: 25 | 30 days supply | Qty: 30 | Fill #0

## 2019-04-09 MED FILL — FLECAINIDE ACETATE 100 MG T: 100 | 30 days supply | Qty: 60 | Fill #0

## 2019-04-28 MED FILL — CARVEDILOL 12.5 MG TABLET: 12.5 | 30 days supply | Qty: 60 | Fill #1

## 2019-04-29 MED FILL — SPIRONOLACTONE 25 MG TABLET: 25 | 30 days supply | Qty: 30 | Fill #0

## 2019-05-06 MED FILL — AMLODIPINE BESYLATE 10 MG T: 10 | 30 days supply | Qty: 30 | Fill #0

## 2019-05-15 ENCOUNTER — Encounter (HOSPITAL_COMMUNITY): Payer: No Typology Code available for payment source | Admitting: Internal Medicine

## 2019-05-19 ENCOUNTER — Other Ambulatory Visit (HOSPITAL_COMMUNITY): Payer: Self-pay | Admitting: Internal Medicine

## 2019-05-19 MED FILL — HYDROCHLOROTHIAZIDE 25 MG T: 25 | 30 days supply | Qty: 30 | Fill #1

## 2019-05-19 MED FILL — LOSARTAN POTASSIUM 100 MG T: 100 | 30 days supply | Qty: 45 | Fill #0

## 2019-06-11 ENCOUNTER — Other Ambulatory Visit: Payer: Self-pay | Admitting: Cardiology

## 2019-06-12 MED FILL — FLECAINIDE ACETATE 100 MG T: 100 | 90 days supply | Qty: 180 | Fill #0

## 2019-06-16 MED FILL — hydrALAZINE HCL 100 MG TABS: 100 | 34 days supply | Qty: 102 | Fill #0

## 2019-06-17 MED FILL — SPIRONOLACTONE 25 MG TABLET: 25 | 30 days supply | Qty: 30 | Fill #1

## 2019-07-03 MED FILL — AMLODIPINE BESYLATE 10 MG T: 10 | 30 days supply | Qty: 30 | Fill #1

## 2019-07-21 ENCOUNTER — Other Ambulatory Visit (HOSPITAL_COMMUNITY): Payer: Self-pay | Admitting: Internal Medicine

## 2019-07-21 MED FILL — LOSARTAN POTASSIUM 100 MG T: 100 | 30 days supply | Qty: 45 | Fill #0

## 2019-07-21 MED FILL — HYDROCHLOROTHIAZIDE 25 MG T: 25 | 30 days supply | Qty: 30 | Fill #0

## 2019-08-06 MED FILL — SPIRONOLACTONE 25 MG TABS: 25 | 30 days supply | Qty: 30 | Fill #2

## 2019-08-13 MED FILL — AMLODIPINE BESYLATE 10 MG T: 10 | 30 days supply | Qty: 30 | Fill #2

## 2019-09-03 ENCOUNTER — Telehealth (HOSPITAL_COMMUNITY): Payer: Self-pay | Admitting: Licensed Clinical Social Worker

## 2019-09-03 NOTE — Telephone Encounter (Addendum)
CSW reaching out to patient because they are currently receiving medications through [facility].  CSW called to discuss if pt is eligible for any alternative coverage options.    Patient reports that she is not a citizen and does not currently have a greencard so would not be eligible to pursue insurance through the [facility] at this time.  CSW spoke with pt about getting appt at [facility] as she does not have a PCP and speaking with them about Orange Card/CAFA/Blue card to help with medications- pt will plan on calling [facility] to set up initial appt.  CSW will continue to follow to assist as needed.  Jorge Ny, LCSW Clinical Social Worker [facility] Desk#: [phone] Cell#: [phone]

## 2019-09-09 MED FILL — HYDROCHLOROTHIAZIDE 25 MG T: 25 | 30 days supply | Qty: 30 | Fill #1

## 2019-09-09 MED FILL — CARVEDILOL 12.5 MG TABLET: 12.5 | 30 days supply | Qty: 60 | Fill #2

## 2019-09-22 ENCOUNTER — Other Ambulatory Visit (HOSPITAL_COMMUNITY): Payer: Self-pay | Admitting: Internal Medicine

## 2019-09-22 MED FILL — hydrALAZINE HCL 100 MG TABS: 100 | 34 days supply | Qty: 102 | Fill #0

## 2019-09-22 MED FILL — LOSARTAN POTASSIUM 100 MG T: 100 | 30 days supply | Qty: 45 | Fill #0

## 2019-09-22 MED FILL — SPIRONOLACTONE 25 MG TABS: 25 | 30 days supply | Qty: 30 | Fill #3

## 2019-10-06 MED FILL — AMLODIPINE BESYLATE 10 MG T: 10 | 30 days supply | Qty: 30 | Fill #3

## 2019-10-21 MED FILL — HYDROCHLOROTHIAZIDE 25 MG T: 25 | 30 days supply | Qty: 30 | Fill #2

## 2019-11-03 ENCOUNTER — Other Ambulatory Visit (HOSPITAL_COMMUNITY): Payer: Self-pay | Admitting: Internal Medicine

## 2019-11-03 MED FILL — LOSARTAN POTASSIUM 100 MG T: 100 | 30 days supply | Qty: 45 | Fill #0

## 2019-11-03 MED FILL — spIRONOLACTONE 25 MG TABS: 25 | 30 days supply | Qty: 30 | Fill #4

## 2019-11-19 ENCOUNTER — Other Ambulatory Visit: Payer: Self-pay | Admitting: Cardiology

## 2019-11-19 MED FILL — FLECAINIDE ACETATE 100 MG T: 100 | 15 days supply | Qty: 30 | Fill #0

## 2019-11-19 MED FILL — AMLODIPINE BESYLATE 10 MG T: 10 | 30 days supply | Qty: 30 | Fill #4

## 2019-12-05 MED FILL — HYDROCHLOROTHIAZIDE 25 MG T: 25 | 30 days supply | Qty: 30 | Fill #3

## 2019-12-08 ENCOUNTER — Ambulatory Visit: Payer: Self-pay | Admitting: Cardiology

## 2019-12-15 ENCOUNTER — Other Ambulatory Visit: Payer: Self-pay | Admitting: Cardiology

## 2019-12-15 MED FILL — FLECAINIDE ACETATE 100 MG T: 100 | 30 days supply | Qty: 60 | Fill #0

## 2019-12-15 MED FILL — SPIRONOLACTONE 25 MG TABS: 25 | 30 days supply | Qty: 30 | Fill #5

## 2020-01-06 ENCOUNTER — Ambulatory Visit (INDEPENDENT_AMBULATORY_CARE_PROVIDER_SITE_OTHER): Payer: Self-pay | Admitting: Cardiology

## 2020-01-06 ENCOUNTER — Encounter: Payer: Self-pay | Admitting: Cardiology

## 2020-01-06 ENCOUNTER — Other Ambulatory Visit: Payer: Self-pay

## 2020-01-06 VITALS — BP 148/101 | HR 73 | Ht 65.0 in | Wt 216.0 lb

## 2020-01-06 DIAGNOSIS — I493 Ventricular premature depolarization: Secondary | ICD-10-CM

## 2020-01-06 NOTE — Progress Notes (Signed)
Electrophysiology Office Note   Date:  01/06/2020   ID:  Mandy Serrano, DOB Oct 31, 1975, MRN [id number]  PCP:  Patient, No Pcp Per  Cardiologist:  [facility] Primary Electrophysiologist:  Keia Rask Jorja Loa, MD    No chief complaint on file.   History of Present Illness: Mandy Serrano is a 44 y.o. female who is being seen today for the evaluation of PVCs at the request of Mandy Serrano. Presenting today for electrophysiology evaluation.  Has a history of obesity, hypertension, sickle cell, systolic heart failure diagnosed 05/2016, headaches, and frequent PVCs.  She is admitted 45 with dyspnea and hypertensive crisis.  She had not been taking his medications for a year.  She was found to have new acute systolic heart failure by echo with an EF of 25%.  Cardiac MRI showed no infiltrative or inflammatory process.  She high PVC burden and was thus started on amiodarone.  He has been taken off of her amiodarone and continues to have a high burden of PVCs.  She is not aware of her PVCs.  She has not had any heart failure symptoms since stopping the amiodarone.  Today, denies symptoms of palpitations, chest pain, shortness of breath, orthopnea, PND, lower extremity edema, claudication, dizziness, presyncope, syncope, bleeding, or neurologic sequela. The patient is tolerating medications without difficulties.  She currently feels well.  She does have occasional chest pain, but this is not associated with exertion.  She is otherwise able to do all of her daily activities.  She does not note much in the way of palpitations.  She does say that she has a long family history of hypertension.  Her blood pressure is elevated today.  She does get some dizziness while higher doses of carvedilol.  Aside from that, she is doing well without complaint.  Past Medical History:  Diagnosis Date  . Malignant hypertension    Past Surgical History:  Procedure Laterality Date  . CESAREAN SECTION       Current  Outpatient Medications  Medication Sig Dispense Refill  . acetaminophen (TYLENOL) 500 MG tablet Take 500 mg by mouth every 6 (six) hours as needed for mild pain or headache.    Marland Kitchen amLODipine (NORVASC) 10 MG tablet TAKE 1 TABLET BY MOUTH DAILY 30 tablet 6  . ASPIR-LOW 81 MG EC tablet TAKE 1 TABLET BY MOUTH DAILY. 30 tablet 3  . carvedilol (COREG) 12.5 MG tablet Take 1 tablet (12.5 mg total) by mouth 2 (two) times daily with a meal. 60 tablet 6  . flecainide (TAMBOCOR) 100 MG tablet Take 1 tablet (100 mg total) by mouth 2 (two) times daily. 60 tablet 0  . hydrALAZINE (APRESOLINE) 100 MG tablet TAKE 1 TABLET BY MOUTH 3 TIMES DAILY 102 tablet 0  . hydrochlorothiazide (HYDRODIURIL) 25 MG tablet TAKE 1 TABLET BY MOUTH DAILY. 30 tablet 3  . losartan (COZAAR) 100 MG tablet TAKE 1 TABLET BY MOUTH EVERY MORNING AND 1/2 TABLET EVERY EVENING 45 tablet 0  . spironolactone (ALDACTONE) 25 MG tablet TAKE 1 TABLET BY MOUTH DAILY 30 tablet 6   No current facility-administered medications for this visit.    Allergies:   Patient has no known allergies.   Social History:  The patient  reports that she has never smoked. She has never used smokeless tobacco. She reports that she does not drink alcohol or use drugs.   Family History:  The patient's family history includes Hypertension in her father and mother.   ROS:  Please see the history  of present illness.   Otherwise, review of systems is positive for none.   All other systems are reviewed and negative.   PHYSICAL EXAM: VS:  BP (!) 148/101   Pulse 73   Ht 5\' 5"  (1.651 m)   Wt 216 lb (98 kg)   SpO2 100%   BMI 35.94 kg/m  , BMI Body mass index is 35.94 kg/m. GEN: Well nourished, well developed, in no acute distress  HEENT: normal  Neck: no JVD, carotid bruits, or masses Cardiac: RRR; no murmurs, rubs, or gallops,no edema  Respiratory:  clear to auscultation bilaterally, normal work of breathing GI: soft, nontender, nondistended, + BS MS: no deformity  or atrophy  Skin: warm and dry Neuro:  Strength and sensation are intact Psych: euthymic mood, full affect  EKG:  EKG is ordered today. Personal review of the ekg ordered shows sinus rhythm, rate 73  Recent Labs: No results found for requested labs within last 8760 hours.    Lipid Panel     Component Value Date/Time   CHOL 223 (H) 09/19/2016 1436   TRIG 104 09/19/2016 1436   HDL 51 09/19/2016 1436   CHOLHDL 4.4 09/19/2016 1436   VLDL 21 09/19/2016 1436   LDLCALC 151 (H) 09/19/2016 1436     Wt Readings from Last 3 Encounters:  01/06/20 216 lb (98 kg)  09/17/18 227 lb 9.6 oz (103.2 kg)  05/13/18 232 lb 9.6 oz (105.5 kg)      Other studies Reviewed: Additional studies/ records that were reviewed today include: TTE 10/11/17  Review of the above records today demonstrates:  - Left ventricle: The cavity size was mildly dilated. There was   mild focal basal and mild concentric hypertrophy of the septum.   The estimated ejection fraction was 55%. Wall motion was normal;   there were no regional wall motion abnormalities. Doppler   parameters are consistent with abnormal left ventricular   relaxation (grade 1 diastolic dysfunction). - Aortic valve: There was trivial regurgitation. - Impressions: EF has recovered since previous echo. Small mobile   filamentous structure in LV apex likel ruptured chordae tendineae   (benign).  CMRI 06/14/16 1. Moderate left ventricular dilatation with moderate concentric hypertrophy and severely decreased systolic function (LVEF = 20%). There is diffuse hypokinesis. No late gadolinium enhancement was seen.  2. Normal right ventricular size, thickness and borderline systolic function (RVEF =76%, normal > 45%) with no regional wall motion abnormalities.  3. Mildly to moderately dilated left atrium. Normal size of the right atrium.  4. Mild to moderate mitral regurgitation, mild tricuspid regurgitation. Mild aortic  regurgitation.  ASSESSMENT AND PLAN:  1.  PVCs: 16% in 2017 and was put on amiodarone.  Ejection fraction recovered.  Amiodarone was stopped and PVCs returned.  She has since been put on flecainide.  Fortunately her ECG today shows no evidence of PVCs.  We Hamzah Savoca continue at current doses.  2.  Chronic systolic heart failure due to nonischemic cardiomyopathy: Ejection fraction has since improved.  3.  Hypertension: Blood pressure significantly elevated today.  It is also elevated at home.  She is on 6 blood pressure medications.  We Honor Frison refer her to hypertension clinic.  Current medicines are reviewed at length with the patient today.   The patient does not have concerns regarding her medicines.  The following changes were made today: none  Labs/ tests ordered today include:  Orders Placed This Encounter  Procedures  . EKG 12-Lead    Disposition:  FU with Dejane Scheibe 6 months  Signed, Haadiya Frogge Jorja Loa, MD  01/06/2020 11:50 AM     [facility] [address] [place] [address] [phone] (office) [phone] (fax)

## 2020-01-06 NOTE — Patient Instructions (Signed)
Medication Instructions:  Your physician recommends that you continue on your current medications as directed. Please refer to the Current Medication list given to you today.  *If you need a refill on your cardiac medications before your next appointment, please call your pharmacy*   Lab Work: None ordered  Testing/Procedures: None ordered   Follow-Up: Your physician recommends that you schedule a follow-up appointment in: the blood pressure clinic with Dr. Duke Salvia.  At [facility], you and your health needs are our priority.  As part of our continuing mission to provide you with exceptional heart care, we have created designated Provider Care Teams.  These Care Teams include your primary Cardiologist (physician) and Advanced Practice Providers (APPs -  Physician Assistants and Nurse Practitioners) who all work together to provide you with the care you need, when you need it.  We recommend signing up for the patient portal called "MyChart".  Sign up information is provided on this After Visit Summary.  MyChart is used to connect with patients for Virtual Visits (Telemedicine).  Patients are able to view lab/test results, encounter notes, upcoming appointments, etc.  Non-urgent messages can be sent to your provider as well.   To learn more about what you can do with MyChart, go to [email or web address].    Your next appointment:   6 month(s)  The format for your next appointment:   In Person  Provider:   Loman Brooklyn, MD   Thank you for choosing [facility]!!   Dory Horn, RN [phone]    Other Instructions

## 2020-01-08 ENCOUNTER — Other Ambulatory Visit (HOSPITAL_COMMUNITY): Payer: Self-pay | Admitting: Internal Medicine

## 2020-01-08 ENCOUNTER — Other Ambulatory Visit: Payer: Self-pay | Admitting: *Deleted

## 2020-01-08 MED FILL — AMLODIPINE BESYLATE 10 MG T: 10 | 30 days supply | Qty: 30 | Fill #5

## 2020-01-09 MED FILL — LOSARTAN POTASSIUM 100 MG T: 100 | 30 days supply | Qty: 45 | Fill #0

## 2020-01-09 NOTE — Telephone Encounter (Signed)
This is a CHF pt 

## 2020-01-21 ENCOUNTER — Other Ambulatory Visit: Payer: Self-pay | Admitting: Cardiology

## 2020-01-21 DIAGNOSIS — I5022 Chronic systolic (congestive) heart failure: Secondary | ICD-10-CM

## 2020-01-21 MED FILL — CARVEDILOL 12.5 MG TABLET: 12.5 | 90 days supply | Qty: 180 | Fill #0

## 2020-01-22 ENCOUNTER — Other Ambulatory Visit (HOSPITAL_COMMUNITY): Payer: Self-pay | Admitting: Internal Medicine

## 2020-01-22 NOTE — Telephone Encounter (Signed)
This is a CHF pt 

## 2020-02-04 ENCOUNTER — Other Ambulatory Visit (HOSPITAL_COMMUNITY): Payer: Self-pay | Admitting: Internal Medicine

## 2020-02-05 ENCOUNTER — Ambulatory Visit: Payer: Self-pay | Admitting: Cardiology

## 2020-02-05 ENCOUNTER — Other Ambulatory Visit (HOSPITAL_COMMUNITY): Payer: Self-pay | Admitting: Cardiology

## 2020-02-05 MED FILL — HYDROCHLOROTHIAZIDE 25 MG T: 25 | 90 days supply | Qty: 90 | Fill #0

## 2020-02-05 MED FILL — SPIRONOLACTONE 25 MG TABS: 25 | 90 days supply | Qty: 90 | Fill #0

## 2020-02-05 MED FILL — hydrALAZINE HCL 100 MG TABS: 100 | 30 days supply | Qty: 90 | Fill #0

## 2020-02-05 NOTE — Telephone Encounter (Signed)
Pt has not been here since 2019. Pt was last seen by Dr.Camnitz.  Please address

## 2020-02-20 ENCOUNTER — Other Ambulatory Visit: Payer: Self-pay | Admitting: Cardiology

## 2020-02-20 MED FILL — FLECAINIDE ACETATE 100 MG T: 100 | 30 days supply | Qty: 60 | Fill #0

## 2020-02-26 ENCOUNTER — Other Ambulatory Visit (HOSPITAL_COMMUNITY): Payer: Self-pay | Admitting: Internal Medicine

## 2020-02-26 NOTE — Telephone Encounter (Signed)
This is a CHF pt 

## 2020-03-09 MED FILL — LOSARTAN POTASSIUM 100 MG T: 100 | 30 days supply | Qty: 45 | Fill #1

## 2020-03-26 ENCOUNTER — Other Ambulatory Visit (HOSPITAL_COMMUNITY): Payer: Self-pay | Admitting: Adult Health

## 2020-03-26 MED FILL — AMLODIPINE BESYLATE 10 MG T: 10 | 90 days supply | Qty: 90 | Fill #0

## 2020-04-23 MED FILL — FLECAINIDE ACETATE 100 MG T: 100 | 30 days supply | Qty: 60 | Fill #1

## 2020-04-23 MED FILL — LOSARTAN POTASSIUM 100 MG T: 100 | 30 days supply | Qty: 45 | Fill #2

## 2020-05-10 ENCOUNTER — Encounter (HOSPITAL_COMMUNITY): Payer: Self-pay | Admitting: Internal Medicine

## 2020-05-10 ENCOUNTER — Ambulatory Visit (HOSPITAL_COMMUNITY)
Admission: RE | Admit: 2020-05-10 | Discharge: 2020-05-10 | Disposition: A | Discharge status: Home / Self Care | Payer: No Typology Code available for payment source | Source: Ambulatory Visit | Attending: Internal Medicine | Admitting: Internal Medicine

## 2020-05-10 ENCOUNTER — Other Ambulatory Visit: Payer: Self-pay

## 2020-05-10 VITALS — BP 138/84 | HR 72 | Wt 227.0 lb

## 2020-05-10 DIAGNOSIS — I10 Essential (primary) hypertension: Secondary | ICD-10-CM

## 2020-05-10 DIAGNOSIS — D571 Sickle-cell disease without crisis: Secondary | ICD-10-CM | POA: Insufficient documentation

## 2020-05-10 DIAGNOSIS — Z6837 Body mass index (BMI) 37.0-37.9, adult: Secondary | ICD-10-CM | POA: Insufficient documentation

## 2020-05-10 DIAGNOSIS — Z7982 Long term (current) use of aspirin: Secondary | ICD-10-CM | POA: Insufficient documentation

## 2020-05-10 DIAGNOSIS — I110 Hypertensive heart disease with heart failure: Secondary | ICD-10-CM | POA: Insufficient documentation

## 2020-05-10 DIAGNOSIS — E669 Obesity, unspecified: Secondary | ICD-10-CM | POA: Insufficient documentation

## 2020-05-10 DIAGNOSIS — I493 Ventricular premature depolarization: Secondary | ICD-10-CM | POA: Insufficient documentation

## 2020-05-10 DIAGNOSIS — Z79899 Other long term (current) drug therapy: Secondary | ICD-10-CM | POA: Insufficient documentation

## 2020-05-10 DIAGNOSIS — I5022 Chronic systolic (congestive) heart failure: Secondary | ICD-10-CM | POA: Insufficient documentation

## 2020-05-10 DIAGNOSIS — Z8249 Family history of ischemic heart disease and other diseases of the circulatory system: Secondary | ICD-10-CM | POA: Insufficient documentation

## 2020-05-10 LAB — COMPREHENSIVE METABOLIC PANEL
ALT: 14 U/L (ref 0–44)
AST: 19 U/L (ref 15–41)
Albumin: 3.7 g/dL (ref 3.5–5.0)
Alkaline Phosphatase: 59 U/L (ref 38–126)
Anion gap: 11 (ref 5–15)
BUN: 14 mg/dL (ref 6–20)
CO2: 22 mmol/L (ref 22–32)
Calcium: 8.9 mg/dL (ref 8.9–10.3)
Chloride: 102 mmol/L (ref 98–111)
Creatinine, Ser: 0.97 mg/dL (ref 0.44–1.00)
GFR calc Af Amer: >60 mL/min (ref >60)
GFR calc non Af Amer: >60 mL/min (ref >60)
Glucose, Bld: 92 mg/dL (ref 70–99)
Potassium: 4 mmol/L (ref 3.5–5.1)
Sodium: 135 mmol/L (ref 135–145)
Total Bilirubin: 0.9 mg/dL (ref 0.3–1.2)
Total Protein: 7.8 g/dL (ref 6.5–8.1)

## 2020-05-10 LAB — BRAIN NATRIURETIC PEPTIDE: B Natriuretic Peptide: 41 pg/mL (ref 0.0–100.0)

## 2020-05-10 NOTE — Progress Notes (Signed)
ADVANCED HF CLINIC NOTE  PCP: None  Primary Cardiologist: Dr Gala Romney   HPI Mandy Serrano is a 44 y.o. with history of obesity, HTN ( was on 5 anti-HTN medicines in [place]), sickle cell anemia, systolic heart failure 05/2016, headaches, and frequent PVCs.  Admitted 8/17 with increased dyspnea and HTN crisis. She had not been taking any medications over the last last year. New acute systolic heart identified on ECHO. LVEF ~25%. CMRI no infiltrative/inflamatory process and EF ~25%. Had high PVC burden (16%) so she was started on amio. Amio eventually switch to flecainide as EF recovered.   Unable to obtain Sherryll Burger because she is not [place] citizen.   Echo 7/19 EF 60-65%  She returns today for HF follow up. We have not seen her since 2019. Recently seen by EP. PVCs well suppressed on flecainide. Referred to HTN clinic as BP elevated on 6 meds. Here for HF f/u. Says she feels ok. Denies SOB, orthopnea, PND or edema. Compliant with all meds. Lost a5 pounds but gained it back during COVID situation. Does exercise in front of the TV. Her husband told her that she used to snore a lot but doesn't snore any more.   I did bedside echo today EF 60-65%    ECHO 05/2016  LVEF ~25-30%.  CMRI no infiltrative/inflamatory process and EF ~25%.  Echo 12/17 with EF 55% Echo 7/19 EF 60-65%  Review of systems complete and found to be negative unless listed in HPI.   SH:  Social History   Socioeconomic History  . Marital status: Married    Spouse name: Not on file  . Number of children: Not on file  . Years of education: Not on file  . Highest education level: Not on file  Occupational History  . Not on file  Tobacco Use  . Smoking status: Never Smoker  . Smokeless tobacco: Never Used  Substance and Sexual Activity  . Alcohol use: No  . Drug use: No  . Sexual activity: Not on file  Other Topics Concern  . Not on file  Social History Narrative  . Not on file   Social Determinants of Health    Financial Resource Strain:   . Difficulty of Paying Living Expenses:   Food Insecurity:   . Worried About Programme researcher, broadcasting/film/video in the Last Year:   . Barista in the Last Year:   Transportation Needs:   . Freight forwarder (Medical):   Marland Kitchen Lack of Transportation (Non-Medical):   Physical Activity:   . Days of Exercise per Week:   . Minutes of Exercise per Session:   Stress:   . Feeling of Stress :   Social Connections:   . Frequency of Communication with Friends and Family:   . Frequency of Social Gatherings with Friends and Family:   . Attends Religious Services:   . Active Member of Clubs or Organizations:   . Attends Banker Meetings:   Marland Kitchen Marital Status:   Intimate Partner Violence:   . Fear of Current or Ex-Partner:   . Emotionally Abused:   Marland Kitchen Physically Abused:   . Sexually Abused:     FH:  Family History  Problem Relation Age of Onset  . Hypertension Mother   . Hypertension Father     Past Medical History:  Diagnosis Date  . Malignant hypertension     Current Outpatient Medications  Medication Sig Dispense Refill  . acetaminophen (TYLENOL) 500 MG tablet Take 500 mg  by mouth every 6 (six) hours as needed for mild pain or headache.    Marland Kitchen amLODipine (NORVASC) 10 MG tablet Take 1 tablet (10 mg total) by mouth daily. Must keep pending appt for further refills 90 tablet 0  . ASPIR-LOW 81 MG EC tablet TAKE 1 TABLET BY MOUTH DAILY. 30 tablet 3  . carvedilol (COREG) 12.5 MG tablet TAKE 1 TABLET BY MOUTH TWICE DAILY WITH MEALS 180 tablet 3  . flecainide (TAMBOCOR) 100 MG tablet TAKE 1 TABLET BY MOUTH TWICE DAILY. 60 tablet 11  . hydrALAZINE (APRESOLINE) 100 MG tablet TAKE 1 TABLET BY MOUTH 3 TIMES DAILY 270 tablet 3  . hydrochlorothiazide (HYDRODIURIL) 25 MG tablet TAKE 1 TABLET BY MOUTH DAILY. 90 tablet 3  . losartan (COZAAR) 100 MG tablet TAKE 1 TABLET BY MOUTH EVERY MORNING AND 1/2 TABLET EVERY EVENING 45 tablet 2  . spironolactone (ALDACTONE)  25 MG tablet TAKE 1 TABLET BY MOUTH DAILY 90 tablet 3   No current facility-administered medications for this encounter.    Vitals:   05/10/20 1416  BP: 138/84  Pulse: 72  SpO2: 99%  Weight: 103 kg (227 lb)    Wt Readings from Last 3 Encounters:  05/10/20 103 kg (227 lb)  01/06/20 98 kg (216 lb)  09/17/18 103.2 kg (227 lb 9.6 oz)    PHYSICAL EXAM: General:  Well appearing. No resp difficulty HEENT: normal Neck: supple. no JVD. Carotids 2+ bilat; no bruits. No lymphadenopathy or thryomegaly appreciated. Cor: PMI nondisplaced. Regular rate & rhythm. No rubs, gallops or murmurs. Lungs: clear Abdomen: obese soft, nontender, nondistended. No hepatosplenomegaly. No bruits or masses. Good bowel sounds. Extremities: no cyanosis, clubbing, rash, edema Neuro: alert & orientedx3, cranial nerves grossly intact. moves all 4 extremities w/o difficulty. Affect pleasant   ASSESSMENT & PLAN: 1. Chronic Systolic Heart Failure- NICM- HTN  vs PVC induced. ECHO 05/2016  LVEF ~25-30%. CMRI no infiltrative/inflamatory process and EF ~25%.  EF recovery noted 09/2016 with EF up to 55%.  - Echo 04/2018: EF 60-65% - I did bedside echo today (05/10/20) EF 60-65%  - NYHA I - Volume status stable. Does not need lasix.  - Continue current meds  2. HTN - BP reasonably controlled - has f/u with HTN clinic - realizes need for weight loss and more activity - with refractory HTN she needs sleep study but cannot affor without insurance - I have asked SW to see her today  3. PVCs - While in hospital had 16% PVCs in 8/17. Amio added. PVCs suppressed with EF recovery. Off amio and PVCs back.  Started on flecanide per EP. She declined ablation.  - Continue flecanide. Follows with Dr Elberta Fortis. - Holter monitor 01/2018 with rare PVCs on flecainide (0.49%) - No PVCs on exam today - EF stable by bedside echo    Arvilla Meres, MD 2:48 PM

## 2020-05-10 NOTE — Patient Instructions (Signed)
Labs done today, we will notify you for any abnormal results  Please call our office in 1 year to schedule your follow up appointment  If you have any questions or concerns before your next appointment please send [place] a message through Rock or call our office at [phone].    TO LEAVE A MESSAGE FOR THE NURSE SELECT OPTION 2, PLEASE LEAVE A MESSAGE INCLUDING: . YOUR NAME . DATE OF BIRTH . CALL BACK NUMBER . REASON FOR CALL**this is important as we prioritize the call backs  YOU WILL RECEIVE A CALL BACK THE SAME DAY AS LONG AS YOU CALL BEFORE 4:00 PM  At the [facility], you and your health needs are our priority. As part of our continuing mission to provide you with exceptional heart care, we have created designated Provider Care Teams. These Care Teams include your primary Cardiologist (physician) and Advanced Practice Providers (APPs- Physician Assistants and Nurse Practitioners) who all work together to provide you with the care you need, when you need it.   You may see any of the following providers on your designated Care Team at your next follow up: Marland Kitchen Dr Arvilla Meres . Dr Marca Ancona . Tonye Becket, NP . Robbie Lis, PA . Karle Plumber, PharmD   Please be sure to bring in all your medications bottles to every appointment.

## 2020-05-17 MED FILL — hydrALAZINE HCL 100 MG TABS: 100 | 30 days supply | Qty: 90 | Fill #1

## 2020-06-10 MED FILL — HYDROCHLOROTHIAZIDE 25 MG T: 25 | 90 days supply | Qty: 90 | Fill #1

## 2020-06-10 MED FILL — SPIRONOLACTONE 25 MG TABS: 25 | 90 days supply | Qty: 90 | Fill #1

## 2020-06-14 ENCOUNTER — Other Ambulatory Visit (HOSPITAL_COMMUNITY): Payer: Self-pay | Admitting: Cardiology

## 2020-06-14 MED FILL — LOSARTAN POTASSIUM 100 MG T: 100 | 30 days supply | Qty: 45 | Fill #0

## 2020-07-05 MED FILL — FLECAINIDE ACETATE 100 MG T: 100 | 30 days supply | Qty: 60 | Fill #2

## 2020-08-05 ENCOUNTER — Other Ambulatory Visit (HOSPITAL_COMMUNITY): Payer: Self-pay | Admitting: Internal Medicine

## 2020-08-05 MED FILL — AMLODIPINE BESYLATE 10 MG T: 10 | 90 days supply | Qty: 90 | Fill #0

## 2020-08-05 MED FILL — LOSARTAN POTASSIUM 100 MG T: 100 | 30 days supply | Qty: 45 | Fill #1

## 2020-08-17 MED FILL — CARVEDILOL 12.5 MG TABLET: 12.5 | 90 days supply | Qty: 180 | Fill #1

## 2020-08-17 MED FILL — hydrALAZINE HCL 100 MG TABS: 100 | 30 days supply | Qty: 90 | Fill #2

## 2020-09-06 MED FILL — FLECAINIDE ACETATE 100 MG T: 100 | 30 days supply | Qty: 60 | Fill #3

## 2020-09-27 MED FILL — LOSARTAN POTASSIUM 100 MG T: 100 | 30 days supply | Qty: 45 | Fill #2

## 2020-11-04 MED FILL — HYDROCHLOROTHIAZIDE 25 MG T: 25 | 90 days supply | Qty: 90 | Fill #2

## 2020-11-10 MED FILL — SPIRONOLACTONE 25 MG TABS: 25 | 90 days supply | Qty: 90 | Fill #2

## 2020-11-30 ENCOUNTER — Other Ambulatory Visit (HOSPITAL_COMMUNITY): Payer: Self-pay | Admitting: Cardiology

## 2020-11-30 MED FILL — hydrALAZINE HCL 100 MG TABS: 100 | 30 days supply | Qty: 90 | Fill #3

## 2020-11-30 MED FILL — LOSARTAN POTASSIUM 100 MG T: 100 | 30 days supply | Qty: 45 | Fill #0

## 2020-11-30 MED FILL — FLECAINIDE ACETATE 100 MG T: 100 | 30 days supply | Qty: 60 | Fill #4

## 2021-01-25 ENCOUNTER — Other Ambulatory Visit (HOSPITAL_COMMUNITY): Payer: Self-pay

## 2021-01-25 MED FILL — Amlodipine Besylate Tab 10 MG (Base Equivalent): ORAL | 90 days supply | Qty: 90 | Fill #0 | Status: CN

## 2021-02-02 ENCOUNTER — Other Ambulatory Visit (HOSPITAL_COMMUNITY): Payer: Self-pay

## 2021-02-15 ENCOUNTER — Other Ambulatory Visit (HOSPITAL_COMMUNITY): Payer: Self-pay

## 2021-02-15 MED FILL — Losartan Potassium Tab 100 MG: ORAL | 30 days supply | Qty: 45 | Fill #0 | Status: AC

## 2021-02-15 MED FILL — Amlodipine Besylate Tab 10 MG (Base Equivalent): ORAL | 90 days supply | Qty: 90 | Fill #0 | Status: AC

## 2021-03-05 ENCOUNTER — Other Ambulatory Visit (HOSPITAL_COMMUNITY): Payer: Self-pay

## 2021-03-05 ENCOUNTER — Other Ambulatory Visit: Payer: Self-pay | Admitting: Cardiology

## 2021-03-07 ENCOUNTER — Other Ambulatory Visit (HOSPITAL_COMMUNITY): Payer: Self-pay

## 2021-03-07 MED ORDER — FLECAINIDE ACETATE 100 MG PO TABS
100.0000 mg | ORAL_TABLET | Freq: Two times a day (BID) | ORAL | 0 refills | Status: DC
  Filled 2021-03-07: qty 60, 30d supply, fill #0

## 2021-04-27 ENCOUNTER — Other Ambulatory Visit: Payer: Self-pay | Admitting: Cardiology

## 2021-04-27 ENCOUNTER — Other Ambulatory Visit (HOSPITAL_COMMUNITY): Payer: Self-pay

## 2021-04-27 DIAGNOSIS — I5022 Chronic systolic (congestive) heart failure: Secondary | ICD-10-CM

## 2021-04-28 ENCOUNTER — Other Ambulatory Visit (HOSPITAL_COMMUNITY): Payer: Self-pay

## 2021-04-28 MED ORDER — CARVEDILOL 12.5 MG PO TABS
12.5000 mg | ORAL_TABLET | Freq: Two times a day (BID) | ORAL | 0 refills | Status: DC
  Filled 2021-04-28: qty 60, 30d supply, fill #0

## 2021-05-02 ENCOUNTER — Other Ambulatory Visit (HOSPITAL_COMMUNITY): Payer: Self-pay | Admitting: Cardiology

## 2021-05-02 ENCOUNTER — Other Ambulatory Visit (HOSPITAL_COMMUNITY): Payer: Self-pay

## 2021-05-02 MED ORDER — SPIRONOLACTONE 25 MG PO TABS
25.0000 mg | ORAL_TABLET | Freq: Every day | ORAL | 1 refills | Status: DC
  Filled 2021-05-02: qty 90, 90d supply, fill #0
  Filled 2021-11-09: qty 90, 90d supply, fill #1

## 2021-05-02 NOTE — Telephone Encounter (Signed)
This is a CHF pt 

## 2021-05-03 ENCOUNTER — Other Ambulatory Visit (HOSPITAL_COMMUNITY): Payer: Self-pay

## 2021-05-03 ENCOUNTER — Other Ambulatory Visit (HOSPITAL_COMMUNITY): Payer: Self-pay | Admitting: Cardiology

## 2021-05-03 MED ORDER — HYDROCHLOROTHIAZIDE 25 MG PO TABS
25.0000 mg | ORAL_TABLET | Freq: Every day | ORAL | 0 refills | Status: DC
  Filled 2021-05-03: qty 90, 90d supply, fill #0

## 2021-05-05 ENCOUNTER — Other Ambulatory Visit (HOSPITAL_COMMUNITY): Payer: Self-pay

## 2021-05-05 MED FILL — Losartan Potassium Tab 100 MG: ORAL | 30 days supply | Qty: 45 | Fill #1 | Status: AC

## 2021-05-24 ENCOUNTER — Other Ambulatory Visit (HOSPITAL_COMMUNITY): Payer: Self-pay

## 2021-05-24 ENCOUNTER — Other Ambulatory Visit (HOSPITAL_COMMUNITY): Payer: Self-pay | Admitting: Cardiology

## 2021-05-24 MED ORDER — HYDRALAZINE HCL 100 MG PO TABS
100.0000 mg | ORAL_TABLET | Freq: Three times a day (TID) | ORAL | 3 refills | Status: DC
  Filled 2021-05-24: qty 90, 30d supply, fill #0
  Filled 2021-10-22: qty 90, 30d supply, fill #1
  Filled 2022-02-07: qty 90, 30d supply, fill #2
  Filled 2022-05-02: qty 90, 30d supply, fill #3

## 2021-06-17 ENCOUNTER — Other Ambulatory Visit: Payer: Self-pay | Admitting: Cardiology

## 2021-06-17 ENCOUNTER — Other Ambulatory Visit (HOSPITAL_COMMUNITY): Payer: Self-pay

## 2021-06-17 MED ORDER — FLECAINIDE ACETATE 100 MG PO TABS
100.0000 mg | ORAL_TABLET | Freq: Two times a day (BID) | ORAL | 0 refills | Status: DC
  Filled 2021-06-17: qty 60, 30d supply, fill #0

## 2021-06-28 ENCOUNTER — Encounter: Payer: Self-pay | Admitting: *Deleted

## 2021-06-28 ENCOUNTER — Other Ambulatory Visit: Payer: Self-pay

## 2021-06-28 ENCOUNTER — Encounter: Payer: Self-pay | Admitting: Cardiology

## 2021-06-28 ENCOUNTER — Ambulatory Visit (INDEPENDENT_AMBULATORY_CARE_PROVIDER_SITE_OTHER): Payer: Self-pay | Admitting: Cardiology

## 2021-06-28 VITALS — BP 128/78 | HR 70 | Ht 66.0 in | Wt 236.6 lb

## 2021-06-28 DIAGNOSIS — I493 Ventricular premature depolarization: Secondary | ICD-10-CM

## 2021-06-28 NOTE — Patient Instructions (Signed)
Medication Instructions:  °Your physician recommends that you continue on your current medications as directed. Please refer to the Current Medication list given to you today. ° °*If you need a refill on your cardiac medications before your next appointment, please call your pharmacy* ° ° °Lab Work: °None ordered ° ° °Testing/Procedures: °None ordered ° ° °Follow-Up: °At [facility], you and your health needs are our priority.  As part of our continuing mission to provide you with exceptional heart care, we have created designated Provider Care Teams.  These Care Teams include your primary Cardiologist (physician) and Advanced Practice Providers (APPs -  Physician Assistants and Nurse Practitioners) who all work together to provide you with the care you need, when you need it. ° °We recommend signing up for the patient portal called "MyChart".  Sign up information is provided on this After Visit Summary.  MyChart is used to connect with patients for Virtual Visits (Telemedicine).  Patients are able to view lab/test results, encounter notes, upcoming appointments, etc.  Non-urgent messages can be sent to your provider as well.   °To learn more about what you can do with MyChart, go to [email or web address].   ° °Your next appointment:   °6 month(s) ° °The format for your next appointment:   °In Person ° °Provider:   °You will see one of the following Advanced Practice Providers on your designated Care Team:   °Renee Ursuy, PA-C °Michael "Andy" Tillery, PA-C ° ° ° °Thank you for choosing [facility]!! ° ° °Renai Lopata, RN °[phone] ° °

## 2021-06-28 NOTE — Progress Notes (Signed)
Electrophysiology Office Note   Date:  06/28/2021   ID:  Mandy Serrano, DOB 04/11/1976, MRN [id number]  PCP:  Patient, No Pcp Per (Inactive)  Cardiologist:  Bensimhon Primary Electrophysiologist:  Gagandeep Kossman Jorja Loa, MD    No chief complaint on file.   History of Present Illness: Mandy Serrano is a 45 y.o. female who is being seen today for the evaluation of PVCs at the request of Arvilla Meres. Presenting today for electrophysiology evaluation.    She has a history significant for obesity, hypertension, sickle cell, systolic heart failure diagnosed in 2017, headaches, and frequent PVCs.  She was admitted in 2017 with dyspnea and hypertension.  She had been off medications for 1 year.  She was found to have an ejection fraction of 25%.  MRI showed was negative for infiltrative or inflammatory process.  She had a high PVC burden and was started on amiodarone.  Ejection fraction recovered.  Amiodarone was stopped, though PVCs returned.  She has since been started on flecainide.  Today, denies symptoms of palpitations, chest pain, shortness of breath, orthopnea, PND, lower extremity edema, claudication, dizziness, presyncope, syncope, bleeding, or neurologic sequela. The patient is tolerating medications without difficulties.  Since being seen she has done well.  She has no chest pain or shortness of breath.  She has been able to do all her daily activities.  She has not noted any PVCs.  She is tolerating the flecainide without issue.  Past Medical History:  Diagnosis Date   Malignant hypertension    Past Surgical History:  Procedure Laterality Date   CESAREAN SECTION       Current Outpatient Medications  Medication Sig Dispense Refill   acetaminophen (TYLENOL) 500 MG tablet Take 500 mg by mouth every 6 (six) hours as needed for mild pain or headache.     amLODipine (NORVASC) 10 MG tablet TAKE 1 TABLET (10 MG TOTAL) BY MOUTH DAILY. 90 tablet 3   ASPIR-LOW 81 MG EC tablet TAKE 1  TABLET BY MOUTH DAILY. 30 tablet 3   carvedilol (COREG) 12.5 MG tablet Take 1 tablet (12.5 mg total) by mouth 2 (two) times daily with a meal. Please make overdue appt with Dr. Elberta Fortis before anymore refills. Thank you 1st attempt 60 tablet 0   flecainide (TAMBOCOR) 100 MG tablet Take 1 tablet (100 mg total) by mouth 2 (two) times daily. 60 tablet 0   hydrALAZINE (APRESOLINE) 100 MG tablet Take 1 tablet (100 mg total) by mouth 3 (three) times daily. 270 tablet 3   hydrochlorothiazide (HYDRODIURIL) 25 MG tablet Take 1 tablet (25 mg total) by mouth daily. 90 tablet 0   losartan (COZAAR) 100 MG tablet TAKE 1 TABLET BY MOUTH EVERY MORNING AND 1/2 TABLET EVERY EVENING 45 tablet 2   spironolactone (ALDACTONE) 25 MG tablet Take 1 tablet (25 mg total) by mouth daily. Follow up appt needed for further refills. Please call [phone] to schedule appt 90 tablet 1   No current facility-administered medications for this visit.    Allergies:   Patient has no known allergies.   Social History:  The patient  reports that she has never smoked. She has never used smokeless tobacco. She reports that she does not drink alcohol and does not use drugs.   Family History:  The patient's family history includes Hypertension in her father and mother.   ROS:  Please see the history of present illness.   Otherwise, review of systems is positive for none.   All other systems  are reviewed and negative.   PHYSICAL EXAM: VS:  BP 128/78   Pulse 70   Ht 5\' 6"  (1.676 m)   Wt 236 lb 9.6 oz (107.3 kg)   SpO2 98%   BMI 38.19 kg/m  , BMI Body mass index is 38.19 kg/m. GEN: Well nourished, well developed, in no acute distress  HEENT: normal  Neck: no JVD, carotid bruits, or masses Cardiac: RRR; no murmurs, rubs, or gallops,no edema  Respiratory:  clear to auscultation bilaterally, normal work of breathing GI: soft, nontender, nondistended, + BS MS: no deformity or atrophy  Skin: warm and dry Neuro:  Strength and  sensation are intact Psych: euthymic mood, full affect  EKG:  EKG is ordered today. Personal review of the ekg ordered shows sinus rhythm, rate 70  Recent Labs: No results found for requested labs within last 8760 hours.    Lipid Panel     Component Value Date/Time   CHOL 223 (H) 09/19/2016 1436   TRIG 104 09/19/2016 1436   HDL 51 09/19/2016 1436   CHOLHDL 4.4 09/19/2016 1436   VLDL 21 09/19/2016 1436   LDLCALC 151 (H) 09/19/2016 1436     Wt Readings from Last 3 Encounters:  06/28/21 236 lb 9.6 oz (107.3 kg)  05/10/20 227 lb (103 kg)  01/06/20 216 lb (98 kg)      Other studies Reviewed: Additional studies/ records that were reviewed today include: TTE 10/11/17  Review of the above records today demonstrates:  - Left ventricle: The cavity size was mildly dilated. There was   mild focal basal and mild concentric hypertrophy of the septum.   The estimated ejection fraction was 55%. Wall motion was normal;   there were no regional wall motion abnormalities. Doppler   parameters are consistent with abnormal left ventricular   relaxation (grade 1 diastolic dysfunction). - Aortic valve: There was trivial regurgitation. - Impressions: EF has recovered since previous echo. Small mobile   filamentous structure in LV apex likel ruptured chordae tendineae   (benign).  CMRI 06/14/16 1. Moderate left ventricular dilatation with moderate concentric hypertrophy and severely decreased systolic function (LVEF = 20%). There is diffuse hypokinesis. No late gadolinium enhancement was seen.   2. Normal right ventricular size, thickness and borderline systolic function (RVEF =44%, normal > 45%) with no regional wall motion abnormalities.   3. Mildly to moderately dilated left atrium. Normal size of the right atrium.   4. Mild to moderate mitral regurgitation, mild tricuspid regurgitation. Mild aortic regurgitation.  ASSESSMENT AND PLAN:  1.  PVCs: Burden of 60% in 2017 he was put  on amiodarone.  Ejection fraction recovered.  Amiodarone was stopped and PVCs returned.  Has since been started on flecainide 100 mg twice daily.  ECG performed for high risk medication monitoring.  She has had no further PVCs.  She is unaware of arrhythmia.  2.  Chronic systolic heart failure due to nonischemic cardiomyopathy: Ejection fraction is improved.  No obvious volume overload.  3.  Hypertension: Currently well controlled   Current medicines are reviewed at length with the patient today.   The patient does not have concerns regarding her medicines.  The following changes were made today: None  Labs/ tests ordered today include:  Orders Placed This Encounter  Procedures   EKG 12-Lead     Disposition:   FU with Devonte Migues 6 months  Signed, Gerene Nedd 2018, MD  06/28/2021 10:22 AM     [facility] [address]  [address] [place] [address] [phone] (office) [phone] (fax)

## 2021-07-14 ENCOUNTER — Other Ambulatory Visit (HOSPITAL_COMMUNITY): Payer: Self-pay | Admitting: Cardiology

## 2021-07-18 ENCOUNTER — Other Ambulatory Visit (HOSPITAL_COMMUNITY): Payer: Self-pay

## 2021-07-18 MED ORDER — LOSARTAN POTASSIUM 100 MG PO TABS
ORAL_TABLET | Freq: Two times a day (BID) | ORAL | 2 refills | Status: DC
  Filled 2021-07-18: qty 45, 30d supply, fill #0
  Filled 2021-09-27: qty 45, 30d supply, fill #1
  Filled 2021-12-13: qty 45, 30d supply, fill #2

## 2021-07-31 MED FILL — Amlodipine Besylate Tab 10 MG (Base Equivalent): ORAL | 90 days supply | Qty: 90 | Fill #1 | Status: AC

## 2021-08-01 ENCOUNTER — Other Ambulatory Visit (HOSPITAL_COMMUNITY): Payer: Self-pay

## 2021-08-16 ENCOUNTER — Other Ambulatory Visit: Payer: Self-pay | Admitting: Cardiology

## 2021-08-16 ENCOUNTER — Other Ambulatory Visit (HOSPITAL_COMMUNITY): Payer: Self-pay

## 2021-08-16 DIAGNOSIS — I5022 Chronic systolic (congestive) heart failure: Secondary | ICD-10-CM

## 2021-08-16 MED ORDER — CARVEDILOL 12.5 MG PO TABS
12.5000 mg | ORAL_TABLET | Freq: Two times a day (BID) | ORAL | 3 refills | Status: DC
  Filled 2021-08-16: qty 180, 90d supply, fill #0
  Filled 2022-03-21: qty 180, 90d supply, fill #1

## 2021-09-23 ENCOUNTER — Other Ambulatory Visit (HOSPITAL_COMMUNITY): Payer: Self-pay

## 2021-09-23 ENCOUNTER — Other Ambulatory Visit: Payer: Self-pay | Admitting: Cardiology

## 2021-09-23 MED ORDER — FLECAINIDE ACETATE 100 MG PO TABS
100.0000 mg | ORAL_TABLET | Freq: Two times a day (BID) | ORAL | 9 refills | Status: DC
  Filled 2021-09-23: qty 60, 30d supply, fill #0
  Filled 2021-12-13: qty 60, 30d supply, fill #1
  Filled 2022-02-16: qty 60, 30d supply, fill #2
  Filled 2022-04-12: qty 60, 30d supply, fill #3
  Filled 2022-06-10: qty 60, 30d supply, fill #4
  Filled 2022-08-27: qty 60, 30d supply, fill #5

## 2021-09-26 ENCOUNTER — Other Ambulatory Visit (HOSPITAL_COMMUNITY): Payer: Self-pay

## 2021-09-27 ENCOUNTER — Other Ambulatory Visit (HOSPITAL_COMMUNITY): Payer: Self-pay

## 2021-09-29 ENCOUNTER — Other Ambulatory Visit (HOSPITAL_COMMUNITY): Payer: Self-pay

## 2021-10-24 ENCOUNTER — Other Ambulatory Visit (HOSPITAL_COMMUNITY): Payer: Self-pay

## 2021-11-03 ENCOUNTER — Other Ambulatory Visit (HOSPITAL_COMMUNITY): Payer: Self-pay

## 2021-11-03 ENCOUNTER — Other Ambulatory Visit (HOSPITAL_COMMUNITY): Payer: Self-pay | Admitting: Cardiology

## 2021-11-03 MED ORDER — HYDROCHLOROTHIAZIDE 25 MG PO TABS
25.0000 mg | ORAL_TABLET | Freq: Every day | ORAL | 0 refills | Status: DC
  Filled 2021-11-03: qty 90, 90d supply, fill #0

## 2021-11-09 ENCOUNTER — Other Ambulatory Visit (HOSPITAL_COMMUNITY): Payer: Self-pay

## 2021-12-14 ENCOUNTER — Other Ambulatory Visit (HOSPITAL_COMMUNITY): Payer: Self-pay

## 2021-12-15 ENCOUNTER — Other Ambulatory Visit (HOSPITAL_COMMUNITY): Payer: Self-pay

## 2021-12-29 ENCOUNTER — Ambulatory Visit (INDEPENDENT_AMBULATORY_CARE_PROVIDER_SITE_OTHER): Payer: Self-pay

## 2021-12-29 ENCOUNTER — Other Ambulatory Visit: Payer: Self-pay

## 2021-12-29 ENCOUNTER — Encounter: Payer: Self-pay | Admitting: Cardiology

## 2021-12-29 ENCOUNTER — Ambulatory Visit (INDEPENDENT_AMBULATORY_CARE_PROVIDER_SITE_OTHER): Payer: No Typology Code available for payment source | Admitting: Cardiology

## 2021-12-29 VITALS — BP 110/66 | HR 74 | Ht 66.0 in | Wt 226.8 lb

## 2021-12-29 DIAGNOSIS — I493 Ventricular premature depolarization: Secondary | ICD-10-CM

## 2021-12-29 NOTE — Patient Instructions (Signed)
Medication Instructions:  ?Your physician has recommended you make the following change in your medication:  ?STOP Hydrochlorothiazide ? ?*If you need a refill on your cardiac medications before your next appointment, please call your pharmacy* ? ? ?Lab Work: ?None ordered ? ? ?Testing/Procedures: ?ZIO XT- Long Term Monitor Instructions ? ?Your physician has requested you wear a ZIO patch monitor for 3 days.  ?This is a single patch monitor. [employer] supplies one patch monitor per enrollment. Additional ?stickers are not available. Please do not apply patch if you will be having a Nuclear Stress Test,  ?Echocardiogram, Cardiac CT, MRI, or Chest Xray during the period you would be wearing the  ?monitor. The patch cannot be worn during these tests. You cannot remove and re-apply the  ?ZIO XT patch monitor.  ?Your ZIO patch monitor will be mailed 3 day USPS to your address on file. It may take 3-5 days  ?to receive your monitor after you have been enrolled.  ?Once you have received your monitor, please review the enclosed instructions. Your monitor  ?has already been registered assigning a specific monitor serial # to you. ? ?Billing and Patient Assistance Program Information ? ?We have supplied [employer] with any of your insurance information on file for billing purposes. ?[employer] offers a sliding scale Patient Assistance Program for patients that do not have  ?insurance, or whose insurance does not completely cover the cost of the ZIO monitor.  ?You must apply for the Patient Assistance Program to qualify for this discounted rate.  ?To apply, please call [employer] at [phone], select option 4, select option 2, ask to apply for  ?Patient Assistance Program. Mandy Serrano will ask your household income, and how many people  ?are in your household. They will quote your out-of-pocket cost based on that information.  ?[employer] will also be able to set up a 66-month, interest-free payment plan if needed. ? ?Applying the monitor ?   ?Shave hair from upper left chest.  ?Hold abrader disc by orange tab. Rub abrader in 40 strokes over the upper left chest as  ?indicated in your monitor instructions.  ?Clean area with 4 enclosed alcohol pads. Let dry.  ?Apply patch as indicated in monitor instructions. Patch will be placed under collarbone on left  ?side of chest with arrow pointing upward.  ?Rub patch adhesive wings for 2 minutes. Remove white label marked "1". Remove the white  ?label marked "2". Rub patch adhesive wings for 2 additional minutes.  ?While looking in a mirror, press and release button in center of patch. A small green light will  ?flash 3-4 times. This will be your only indicator that the monitor has been turned on.  ?Do not shower for the first 24 hours. You may shower after the first 24 hours.  ?Press the button if you feel a symptom. You will hear a small click. Record Date, Time and  ?Symptom in the Patient Logbook.  ?When you are ready to remove the patch, follow instructions on the last 2 pages of Patient  ?Logbook. Stick patch monitor onto the last page of Patient Logbook.  ?Place Patient Logbook in the blue and white box. Use locking tab on box and tape box closed  ?securely. The blue and white box has prepaid postage on it. Please place it in the mailbox as  ?soon as possible. Your physician should have your test results approximately 7 days after the  ?monitor has been mailed back to [facility].  ?Call [facility] at [phone]  if you have questions regarding  ?your ZIO XT patch monitor. Call them immediately if you see an orange light blinking on your  ?monitor.  ?If your monitor falls off in less than 4 days, contact our Monitor department at [phone].  ?If your monitor becomes loose or falls off after 4 days call [employer] at [phone] for  ?suggestions on securing your monitor ? ? ? ?Follow-Up: ?At [facility], you and your health needs are our priority.  As part of our continuing  mission to provide you with exceptional heart care, we have created designated Provider Care Teams.  These Care Teams include your primary Cardiologist (physician) and Advanced Practice Providers (APPs -  Physician Assistants and Nurse Practitioners) who all work together to provide you with the care you need, when you need it. ? ?We recommend signing up for the patient portal called "MyChart".  Sign up information is provided on this After Visit Summary.  MyChart is used to connect with patients for Virtual Visits (Telemedicine).  Patients are able to view lab/test results, encounter notes, upcoming appointments, etc.  Non-urgent messages can be sent to your provider as well.   ?To learn more about what you can do with MyChart, go to [email or web address].   ? ?Your next appointment:   ?To be  determined ? ?The format for your next appointment:   ?In Person ? ?Provider:   ?Allegra Lai, MD ? ? ? ?Thank you for choosing [facility]!! ? ? ?Trinidad Curet, RN ?([phone] ? ?

## 2021-12-29 NOTE — Progress Notes (Signed)
? ?Electrophysiology Office Note ? ? ?Date:  12/29/2021  ? ?Mandy Serrano, DOB 07-04-1976, MRN [id number] ? ?PCP:  Patient, No Pcp Per (Inactive)  ?Cardiologist:  Bensimhon ?Primary Electrophysiologist:  Mandy Voorheis Jorja Loa, MD   ? ?No chief complaint on file. ? ? ? ?History of Present Illness: ?Mandy Serrano is a 46 y.o. female who is being seen today for the evaluation of PVCs at the request of Mandy Serrano. Presenting today for electrophysiology evaluation.   ? ?He has a history significant for obesity, hypertension, sickle cell, systolic heart failure diagnosed in 2017, headaches, PVCs.  She has been to the hospital in 2017 with dyspnea and hypertension.  She had been off of her medications for approximately 1 year.  She was found to have an ejection fraction of 25%.  MRI was negative for infiltrative or inflammatory process.  She was noted to have a high PVC burden and was started on amiodarone.  Ejection fraction recovered.  Amiodarone was stopped and PVCs return.  She has since started flecainide. ? ?Today, denies symptoms of palpitations, chest pain, shortness of breath, orthopnea, PND, lower extremity edema, claudication, dizziness, presyncope, syncope, bleeding, or neurologic sequela. The patient is tolerating medications without difficulties.  Since being seen she has overall done well.  She has no restrictions.  She does get dizzy sometimes when she stands up.  She states that her blood pressure is low at home at times.  She gets dizzy when she takes her medications.  Aside from that, she has no major complaints.  She feels that she is able to do all of her daily activities.  ECG today shows ventricular bigeminy. ? ?Past Medical History:  ?Diagnosis Date  ? Malignant hypertension   ? ?Past Surgical History:  ?Procedure Laterality Date  ? CESAREAN SECTION    ? ? ? ?Current Outpatient Medications  ?Medication Sig Dispense Refill  ? acetaminophen (TYLENOL) 500 MG tablet Take 500 mg by mouth every 6  (six) hours as needed for mild pain or headache.    ? amLODipine (NORVASC) 10 MG tablet TAKE 1 TABLET (10 MG TOTAL) BY MOUTH DAILY. 90 tablet 3  ? ASPIR-LOW 81 MG EC tablet TAKE 1 TABLET BY MOUTH DAILY. 30 tablet 3  ? carvedilol (COREG) 12.5 MG tablet Take 1 tablet (12.5 mg total) by mouth 2 (two) times daily with a meal. 180 tablet 3  ? flecainide (TAMBOCOR) 100 MG tablet Take 1 tablet (100 mg total) by mouth 2 (two) times daily. 60 tablet 9  ? hydrALAZINE (APRESOLINE) 100 MG tablet Take 1 tablet (100 mg total) by mouth 3 (three) times daily. 270 tablet 3  ? losartan (COZAAR) 100 MG tablet Take 1 tablet by mouth every morning and 1/2 tablet every evening. 45 tablet 2  ? spironolactone (ALDACTONE) 25 MG tablet Take 1 tablet (25 mg total) by mouth daily. Follow up appt needed for further refills. Please call [phone] to schedule appt 90 tablet 1  ? ?No current facility-administered medications for this visit.  ? ? ?Allergies:   Patient has no known allergies.  ? ?Social History:  The patient  reports that she has never smoked. She has been exposed to tobacco smoke. She has never used smokeless tobacco. She reports that she does not drink alcohol and does not use drugs.  ? ?Family History:  The patient's family history includes Hypertension in her father and mother.  ? ?ROS:  Please see the history of present illness.   Otherwise, review of  systems is positive for none.   All other systems are reviewed and negative.  ? ?PHYSICAL EXAM: ?VS:  BP 110/66   Pulse 74   Ht 5\' 6"  (1.676 m)   Wt 226 lb 12.8 oz (102.9 kg)   SpO2 99%   BMI 36.61 kg/m?  , BMI Body mass index is 36.61 kg/m?. ?GEN: Well nourished, well developed, in no acute distress  ?HEENT: normal  ?Neck: no JVD, carotid bruits, or masses ?Cardiac: RRR; no murmurs, rubs, or gallops,no edema  ?Respiratory:  clear to auscultation bilaterally, normal work of breathing ?GI: soft, nontender, nondistended, + BS ?MS: no deformity or atrophy  ?Skin: warm and  dry ?Neuro:  Strength and sensation are intact ?Psych: euthymic mood, full affect ? ?EKG:  EKG is ordered today. ?Personal review of the ekg ordered shows sinus rhythm, ventricular bigeminy ? ?Recent Labs: ?No results found for requested labs within last 8760 hours.  ? ? ?Lipid Panel  ?   ?Component Value Date/Time  ? CHOL 223 (H) 09/19/2016 1436  ? TRIG 104 09/19/2016 1436  ? HDL 51 09/19/2016 1436  ? CHOLHDL 4.4 09/19/2016 1436  ? VLDL 21 09/19/2016 1436  ? LDLCALC 151 (H) 09/19/2016 1436  ? ? ? ?Wt Readings from Last 3 Encounters:  ?12/29/21 226 lb 12.8 oz (102.9 kg)  ?06/28/21 236 lb 9.6 oz (107.3 kg)  ?05/10/20 227 lb (103 kg)  ?  ? ? ?Other studies Reviewed: ?Additional studies/ records that were reviewed today include: TTE 10/11/17  ?Review of the above records today demonstrates:  ?- Left ventricle: The cavity size was mildly dilated. There was ?  mild focal basal and mild concentric hypertrophy of the septum. ?  The estimated ejection fraction was 55%. Wall motion was normal; ?  there were no regional wall motion abnormalities. Doppler ?  parameters are consistent with abnormal left ventricular ?  relaxation (grade 1 diastolic dysfunction). ?- Aortic valve: There was trivial regurgitation. ?- Impressions: EF has recovered since previous echo. Small mobile ?  filamentous structure in LV apex likel ruptured chordae tendineae ?  (benign). ? ?CMRI 06/14/16 ?1. Moderate left ventricular dilatation with moderate concentric ?hypertrophy and severely decreased systolic function (LVEF = 20%). ?There is diffuse hypokinesis. No late gadolinium enhancement was ?seen. ?  ?2. Normal right ventricular size, thickness and borderline systolic ?function (RVEF =44%, normal > 45%) with no regional wall motion ?abnormalities. ?  ?3. Mildly to moderately dilated left atrium. Normal size of the ?right atrium. ?  ?4. Mild to moderate mitral regurgitation, mild tricuspid ?regurgitation. Mild aortic regurgitation. ? ?ASSESSMENT AND  PLAN: ? ?1.  PVCs: Elevated burden in 2017.  She was initially put on amiodarone.  Amiodarone was stopped and PVCs return.  She is now on flecainide 100 mg twice daily.  High risk medication monitoring today via ECG.  Unfortunately she is having more PVCs today and has been dizzy.  We Mandy Serrano have her wear a 3-day monitor to determine her burden. ? ?2.  Chronic systolic heart failure due to nonischemic cardiomyopathy: Ejection fraction is fortunately improved.  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  No changes at this time. ? ?3.  Hypertension: She gets dizzy sometimes.  Her blood pressure is borderline low.  We Mandy Serrano stop her hydrochlorothiazide today.  She is on quite a few blood pressure medications. ? ?Current medicines are reviewed at length with the patient today.   ?The patient does not have concerns regarding her medicines.  The following changes  were made today: Stop hydrochlorothiazide ? ?Labs/ tests ordered today include:  ?Orders Placed This Encounter  ?Procedures  ? LONG TERM MONITOR (3-14 DAYS)  ? EKG 12-Lead  ? ? ? ?Disposition:   FU with Mandy Serrano 6 months ? ?Signed, ?Mandy Quintela Jorja Loa, MD  ?12/29/2021 11:02 AM    ? ?[facility] ?[address] ?[address] ?[place] [address] ?[phone] (office) ?[phone] (fax)  ?

## 2021-12-29 NOTE — Progress Notes (Unsigned)
Applied a 3 day Zio XT monitor to patient in the office 

## 2022-01-30 ENCOUNTER — Other Ambulatory Visit (HOSPITAL_COMMUNITY): Payer: Self-pay | Admitting: Internal Medicine

## 2022-01-31 ENCOUNTER — Other Ambulatory Visit (HOSPITAL_COMMUNITY): Payer: Self-pay

## 2022-01-31 MED ORDER — LOSARTAN POTASSIUM 100 MG PO TABS
ORAL_TABLET | Freq: Two times a day (BID) | ORAL | 0 refills | Status: DC
  Filled 2022-01-31: qty 45, 30d supply, fill #0

## 2022-02-08 ENCOUNTER — Other Ambulatory Visit (HOSPITAL_COMMUNITY): Payer: Self-pay

## 2022-02-17 ENCOUNTER — Other Ambulatory Visit (HOSPITAL_COMMUNITY): Payer: Self-pay

## 2022-02-20 ENCOUNTER — Other Ambulatory Visit (HOSPITAL_COMMUNITY): Payer: Self-pay

## 2022-02-20 ENCOUNTER — Other Ambulatory Visit (HOSPITAL_COMMUNITY): Payer: Self-pay | Admitting: Internal Medicine

## 2022-02-20 MED ORDER — AMLODIPINE BESYLATE 10 MG PO TABS
10.0000 mg | ORAL_TABLET | Freq: Every day | ORAL | 0 refills | Status: DC
  Filled 2022-02-20: qty 90, 90d supply, fill #0

## 2022-03-15 ENCOUNTER — Other Ambulatory Visit (HOSPITAL_COMMUNITY): Payer: Self-pay | Admitting: Internal Medicine

## 2022-03-16 ENCOUNTER — Other Ambulatory Visit (HOSPITAL_COMMUNITY): Payer: Self-pay

## 2022-03-16 MED ORDER — LOSARTAN POTASSIUM 100 MG PO TABS
ORAL_TABLET | ORAL | 0 refills | Status: DC
  Filled 2022-03-16: qty 45, fill #0

## 2022-03-16 MED ORDER — LOSARTAN POTASSIUM 100 MG PO TABS
ORAL_TABLET | ORAL | 0 refills | Status: DC
  Filled 2022-03-16: qty 45, 30d supply, fill #0

## 2022-03-16 NOTE — Addendum Note (Signed)
Addended by: Kerry Dory on: 03/16/2022 04:58 PM   Modules accepted: Orders

## 2022-03-17 ENCOUNTER — Other Ambulatory Visit (HOSPITAL_COMMUNITY): Payer: Self-pay

## 2022-03-21 ENCOUNTER — Other Ambulatory Visit (HOSPITAL_COMMUNITY): Payer: Self-pay

## 2022-04-12 ENCOUNTER — Other Ambulatory Visit (HOSPITAL_COMMUNITY): Payer: Self-pay

## 2022-04-20 ENCOUNTER — Other Ambulatory Visit (HOSPITAL_COMMUNITY): Payer: Self-pay

## 2022-04-20 ENCOUNTER — Other Ambulatory Visit (HOSPITAL_COMMUNITY): Payer: Self-pay | Admitting: Internal Medicine

## 2022-04-20 MED ORDER — SPIRONOLACTONE 25 MG PO TABS
25.0000 mg | ORAL_TABLET | Freq: Every day | ORAL | 3 refills | Status: DC
  Filled 2022-04-20: qty 30, 30d supply, fill #0
  Filled 2022-06-10: qty 30, 30d supply, fill #1
  Filled 2022-08-22: qty 30, 30d supply, fill #2
  Filled 2022-10-25: qty 30, 30d supply, fill #3
  Filled 2022-12-28: qty 30, 30d supply, fill #4
  Filled 2023-02-20: qty 30, 30d supply, fill #5

## 2022-05-02 ENCOUNTER — Other Ambulatory Visit (HOSPITAL_COMMUNITY): Payer: Self-pay | Admitting: Internal Medicine

## 2022-05-03 ENCOUNTER — Other Ambulatory Visit (HOSPITAL_COMMUNITY): Payer: Self-pay

## 2022-05-03 MED ORDER — LOSARTAN POTASSIUM 100 MG PO TABS
ORAL_TABLET | ORAL | 0 refills | Status: DC
  Filled 2022-05-03: qty 45, 30d supply, fill #0

## 2022-05-04 ENCOUNTER — Other Ambulatory Visit (HOSPITAL_COMMUNITY): Payer: Self-pay

## 2022-06-12 ENCOUNTER — Other Ambulatory Visit (HOSPITAL_COMMUNITY): Payer: Self-pay

## 2022-06-27 ENCOUNTER — Other Ambulatory Visit (HOSPITAL_COMMUNITY): Payer: Self-pay

## 2022-06-27 ENCOUNTER — Other Ambulatory Visit (HOSPITAL_COMMUNITY): Payer: Self-pay | Admitting: Internal Medicine

## 2022-06-27 MED ORDER — LOSARTAN POTASSIUM 100 MG PO TABS
ORAL_TABLET | ORAL | 0 refills | Status: DC
  Filled 2022-06-27: qty 45, 30d supply, fill #0

## 2022-07-06 ENCOUNTER — Inpatient Hospital Stay (HOSPITAL_COMMUNITY)
Admission: RE | Admit: 2022-07-06 | Discharge: 2022-07-06 | Disposition: A | Discharge status: Home / Self Care | Payer: Self-pay | Source: Ambulatory Visit | Attending: Internal Medicine | Admitting: Internal Medicine

## 2022-07-06 NOTE — Progress Notes (Signed)
Did not show for appt.

## 2022-08-07 ENCOUNTER — Other Ambulatory Visit (HOSPITAL_COMMUNITY): Payer: Self-pay

## 2022-08-07 ENCOUNTER — Other Ambulatory Visit (HOSPITAL_COMMUNITY): Payer: Self-pay | Admitting: Internal Medicine

## 2022-08-07 MED ORDER — AMLODIPINE BESYLATE 10 MG PO TABS
10.0000 mg | ORAL_TABLET | Freq: Every day | ORAL | 0 refills | Status: DC
  Filled 2022-08-07: qty 90, 90d supply, fill #0

## 2022-08-09 ENCOUNTER — Ambulatory Visit (HOSPITAL_COMMUNITY)
Admission: RE | Admit: 2022-08-09 | Discharge: 2022-08-09 | Disposition: A | Discharge status: Home / Self Care | Payer: Self-pay | Source: Ambulatory Visit | Attending: Internal Medicine | Admitting: Internal Medicine

## 2022-08-09 VITALS — BP 162/100 | HR 76 | Wt 228.4 lb

## 2022-08-09 DIAGNOSIS — I10 Essential (primary) hypertension: Secondary | ICD-10-CM | POA: Insufficient documentation

## 2022-08-09 DIAGNOSIS — I5021 Acute systolic (congestive) heart failure: Secondary | ICD-10-CM | POA: Insufficient documentation

## 2022-08-09 DIAGNOSIS — I493 Ventricular premature depolarization: Secondary | ICD-10-CM

## 2022-08-09 DIAGNOSIS — R9431 Abnormal electrocardiogram [ECG] [EKG]: Secondary | ICD-10-CM | POA: Insufficient documentation

## 2022-08-09 DIAGNOSIS — I5022 Chronic systolic (congestive) heart failure: Secondary | ICD-10-CM

## 2022-08-09 NOTE — Patient Instructions (Signed)
There has been no changes to your medications.  You have been referred to th hypertension clinic. They will call you to arrange your appointment.  Your physician recommends that you schedule a follow-up appointment in: as needed.  If you have any questions or concerns before your next appointment please send [place] a message through [place] or call our office at [phone].    TO LEAVE A MESSAGE FOR THE NURSE SELECT OPTION 2, PLEASE LEAVE A MESSAGE INCLUDING: YOUR NAME DATE OF BIRTH CALL BACK NUMBER REASON FOR CALL**this is important as we prioritize the call backs  YOU WILL RECEIVE A CALL BACK THE SAME DAY AS LONG AS YOU CALL BEFORE 4:00 PM  At the [facility], you and your health needs are our priority. As part of our continuing mission to provide you with exceptional heart care, we have created designated Provider Care Teams. These Care Teams include your primary Cardiologist (physician) and Advanced Practice Providers (APPs- Physician Assistants and Nurse Practitioners) who all work together to provide you with the care you need, when you need it.   You may see any of the following providers on your designated Care Team at your next follow up: Dr Glori Bickers Dr Loralie Champagne Dr. Roxana Hires, NP Lyda Jester, [place] [facility] [place], [place] Forestine Na, NP Audry Riles, PharmD   Please be sure to bring in all your medications bottles to every appointment.

## 2022-08-09 NOTE — Progress Notes (Signed)
ADVANCED HF CLINIC NOTE  PCP: None  Primary Cardiologist: Dr Haroldine Laws   HPI Mandy Serrano is a 46 y.o. with history of obesity, HTN ( was on 5 anti-HTN medicines in [place]), sickle cell anemia, systolic heart failure 11/3555, headaches, and frequent PVCs.  Admitted 8/17 with increased dyspnea and HTN crisis. She had not been taking any medications over the last last year. New acute systolic heart identified on ECHO. LVEF ~25%. CMRI no infiltrative/inflamatory process and EF ~25%. Had high PVC burden (16%) so she was started on amio. Amio eventually switch to flecainide as EF recovered.   Unable to obtain Delene Loll because she is not [place] citizen.    Echo 7/19 EF 60-65%  She returns today for HF follow up. We have not seen her since 7/21 bedside echo at that time EF 60-65%. PVCs suppressed on flecainide. Does all ADLs without problem. No CP or SOB. HCTZ stopped previously due to dizziness with SBP < 120. Denies snoring. Denies follow BP closely at home because cuff is broken for 3 weeks. Prior to that says BP was well controlled.  ECHO 05/2016  LVEF ~25-30%.  CMRI no infiltrative/inflamatory process and EF ~25%.  Echo 12/17 with EF 55% Echo 7/19 EF 60-65%  Review of systems complete and found to be negative unless listed in HPI.   SH:  Social History   Socioeconomic History   Marital status: Married    Spouse name: Not on file   Number of children: Not on file   Years of education: Not on file   Highest education level: Not on file  Occupational History   Not on file  Tobacco Use   Smoking status: Never    Passive exposure: Past   Smokeless tobacco: Never  Substance and Sexual Activity   Alcohol use: No   Drug use: No   Sexual activity: Not on file  Other Topics Concern   Not on file  Social History Narrative   Not on file   Social Determinants of Health   Financial Resource Strain: Not on file  Food Insecurity: Not on file  Transportation Needs: Not on file   Physical Activity: Not on file  Stress: Not on file  Social Connections: Not on file  Intimate Partner Violence: Not on file    FH:  Family History  Problem Relation Age of Onset   Hypertension Mother    Hypertension Father     Past Medical History:  Diagnosis Date   Malignant hypertension     Current Outpatient Medications  Medication Sig Dispense Refill   acetaminophen (TYLENOL) 500 MG tablet Take 500 mg by mouth every 6 (six) hours as needed for mild pain or headache.     amLODipine (NORVASC) 10 MG tablet Take 1 tablet (10 mg total) by mouth daily. 90 tablet 0   ASPIR-LOW 81 MG EC tablet TAKE 1 TABLET BY MOUTH DAILY. 30 tablet 3   carvedilol (COREG) 12.5 MG tablet Take 1 tablet (12.5 mg total) by mouth 2 (two) times daily with a meal. 180 tablet 3   flecainide (TAMBOCOR) 100 MG tablet Take 1 tablet (100 mg total) by mouth 2 (two) times daily. 60 tablet 9   hydrALAZINE (APRESOLINE) 100 MG tablet Take 1 tablet (100 mg total) by mouth 3 (three) times daily. 270 tablet 3   losartan (COZAAR) 100 MG tablet Take 1 tablet (100 mg total) by mouth in the morning AND 0.5 tablets (50 mg total) every evening. Absolute last refill with out  office visit please call [phone] to schedule. 45 tablet 0   spironolactone (ALDACTONE) 25 MG tablet Take 1 tablet (25 mg total) by mouth daily. 90 tablet 3   No current facility-administered medications for this encounter.    Vitals:   08/09/22 1125 08/09/22 1215  BP: (!) 160/100 (!) 162/100  Pulse: 76   SpO2: 99%   Weight: 103.6 kg (228 lb 6.4 oz)     Wt Readings from Last 3 Encounters:  08/09/22 103.6 kg (228 lb 6.4 oz)  12/29/21 102.9 kg (226 lb 12.8 oz)  06/28/21 107.3 kg (236 lb 9.6 oz)    PHYSICAL EXAM: General:  Well appearing. No resp difficulty HEENT: normal Neck: supple. no JVD. Carotids 2+ bilat; no bruits. No lymphadenopathy or thryomegaly appreciated. Cor: PMI nondisplaced. Regular rate & rhythm. No rubs, gallops or  murmurs. Lungs: clear Abdomen: obese soft, nontender, nondistended. No hepatosplenomegaly. No bruits or masses. Good bowel sounds. Extremities: no cyanosis, clubbing, rash, edema Neuro: alert & orientedx3, cranial nerves grossly intact. moves all 4 extremities w/o difficulty. Affect pleasant    ASSESSMENT & PLAN:  1. Chronic Systolic Heart Failure- NICM- HTN  vs PVC induced. ECHO 05/2016  LVEF ~25-30%. CMRI no infiltrative/inflamatory process and EF ~25%.  EF recovery noted 09/2016 with EF up to 55%.  - Echo 04/2018: EF 60-65% - Bedside echo 05/10/20 EF 60-65%  - Remains NYHA I - Volume status stable. Off loop diuretics - Continue current meds  2. HTN - Has not been following BP closely but thinks it has been well controlled though BP elevated here (I rechecked personally) - I have urged her to get a new BP cuff to follow closely - Refer to HTN clinic - realizes need for weight loss and more activity - with refractory HTN she needs sleep study but cannot affor without insurance  3. PVCs - While in hospital had 16% PVCs in 8/17. Amio added. PVCs suppressed with EF recovery. Off amio and PVCs back.  - Started on flecanide per EP. She declined ablation.  - Continue flecanide. Follows with Dr Elberta Fortis. - Holter monitor 01/2018 with rare PVCs on flecainide (0.49%) - No PVCs on exam today - As above, needs sleep study  Can graduate HF Clinic   Mandy Meres, MD 12:15 PM

## 2022-08-22 ENCOUNTER — Other Ambulatory Visit (HOSPITAL_COMMUNITY): Payer: Self-pay

## 2022-08-22 ENCOUNTER — Other Ambulatory Visit (HOSPITAL_COMMUNITY): Payer: Self-pay | Admitting: Cardiology

## 2022-08-22 MED ORDER — HYDRALAZINE HCL 100 MG PO TABS
100.0000 mg | ORAL_TABLET | Freq: Three times a day (TID) | ORAL | 1 refills | Status: DC
  Filled 2022-08-22: qty 90, 30d supply, fill #0
  Filled 2023-01-17: qty 90, 30d supply, fill #1
  Filled 2023-06-18: qty 90, 30d supply, fill #2

## 2022-08-27 ENCOUNTER — Other Ambulatory Visit (HOSPITAL_COMMUNITY): Payer: Self-pay | Admitting: Internal Medicine

## 2022-08-28 ENCOUNTER — Other Ambulatory Visit (HOSPITAL_COMMUNITY): Payer: Self-pay

## 2022-08-28 MED ORDER — LOSARTAN POTASSIUM 100 MG PO TABS
ORAL_TABLET | ORAL | 0 refills | Status: DC
  Filled 2022-08-28: qty 45, 30d supply, fill #0

## 2022-09-27 ENCOUNTER — Ambulatory Visit (HOSPITAL_BASED_OUTPATIENT_CLINIC_OR_DEPARTMENT_OTHER): Payer: Self-pay | Admitting: Cardiovascular Disease

## 2022-10-25 ENCOUNTER — Other Ambulatory Visit: Payer: Self-pay

## 2022-10-30 ENCOUNTER — Other Ambulatory Visit (HOSPITAL_COMMUNITY): Payer: Self-pay

## 2022-11-12 ENCOUNTER — Other Ambulatory Visit: Payer: Self-pay | Admitting: Cardiology

## 2022-11-12 ENCOUNTER — Other Ambulatory Visit (HOSPITAL_COMMUNITY): Payer: Self-pay | Admitting: Internal Medicine

## 2022-11-12 DIAGNOSIS — I5022 Chronic systolic (congestive) heart failure: Secondary | ICD-10-CM

## 2022-11-13 ENCOUNTER — Other Ambulatory Visit (HOSPITAL_COMMUNITY): Payer: Self-pay

## 2022-11-13 MED ORDER — LOSARTAN POTASSIUM 100 MG PO TABS
ORAL_TABLET | ORAL | 0 refills | Status: DC
  Filled 2022-11-13: qty 45, 30d supply, fill #0

## 2022-11-14 ENCOUNTER — Other Ambulatory Visit (HOSPITAL_COMMUNITY): Payer: Self-pay

## 2022-11-14 MED ORDER — CARVEDILOL 12.5 MG PO TABS
12.5000 mg | ORAL_TABLET | Freq: Two times a day (BID) | ORAL | 0 refills | Status: DC
  Filled 2022-11-14: qty 180, 90d supply, fill #0

## 2022-12-21 ENCOUNTER — Other Ambulatory Visit: Payer: Self-pay | Admitting: Cardiology

## 2022-12-21 ENCOUNTER — Other Ambulatory Visit (HOSPITAL_COMMUNITY): Payer: Self-pay

## 2022-12-21 MED ORDER — FLECAINIDE ACETATE 100 MG PO TABS
100.0000 mg | ORAL_TABLET | Freq: Two times a day (BID) | ORAL | 0 refills | Status: DC
  Filled 2022-12-21: qty 60, 30d supply, fill #0

## 2022-12-28 ENCOUNTER — Other Ambulatory Visit (HOSPITAL_COMMUNITY): Payer: Self-pay

## 2023-01-17 ENCOUNTER — Other Ambulatory Visit (HOSPITAL_COMMUNITY): Payer: Self-pay

## 2023-02-20 ENCOUNTER — Other Ambulatory Visit (HOSPITAL_COMMUNITY): Payer: Self-pay | Admitting: Internal Medicine

## 2023-02-20 ENCOUNTER — Other Ambulatory Visit (HOSPITAL_COMMUNITY): Payer: Self-pay

## 2023-02-20 MED ORDER — AMLODIPINE BESYLATE 10 MG PO TABS
10.0000 mg | ORAL_TABLET | Freq: Every day | ORAL | 0 refills | Status: DC
  Filled 2023-02-20: qty 90, 90d supply, fill #0

## 2023-02-20 MED ORDER — LOSARTAN POTASSIUM 100 MG PO TABS
ORAL_TABLET | ORAL | 0 refills | Status: DC
  Filled 2023-02-20: qty 45, 30d supply, fill #0

## 2023-04-01 ENCOUNTER — Other Ambulatory Visit: Payer: Self-pay | Admitting: Cardiology

## 2023-04-02 ENCOUNTER — Other Ambulatory Visit (HOSPITAL_COMMUNITY): Payer: Self-pay

## 2023-04-02 MED ORDER — FLECAINIDE ACETATE 100 MG PO TABS
100.0000 mg | ORAL_TABLET | Freq: Two times a day (BID) | ORAL | 0 refills | Status: DC
  Filled 2023-04-02: qty 60, 30d supply, fill #0

## 2023-05-07 ENCOUNTER — Other Ambulatory Visit (HOSPITAL_COMMUNITY): Payer: Self-pay | Admitting: Internal Medicine

## 2023-05-08 ENCOUNTER — Other Ambulatory Visit (HOSPITAL_COMMUNITY): Payer: Self-pay

## 2023-05-08 MED ORDER — LOSARTAN POTASSIUM 100 MG PO TABS
ORAL_TABLET | ORAL | 0 refills | Status: DC
  Filled 2023-05-08: qty 22, 15d supply, fill #0

## 2023-05-11 ENCOUNTER — Other Ambulatory Visit (HOSPITAL_COMMUNITY): Payer: Self-pay

## 2023-06-18 ENCOUNTER — Other Ambulatory Visit (HOSPITAL_COMMUNITY): Payer: Self-pay | Admitting: Internal Medicine

## 2023-06-18 ENCOUNTER — Other Ambulatory Visit (HOSPITAL_COMMUNITY): Payer: Self-pay

## 2023-06-18 ENCOUNTER — Other Ambulatory Visit (HOSPITAL_COMMUNITY): Payer: Self-pay | Admitting: Cardiology

## 2023-06-18 DIAGNOSIS — I5021 Acute systolic (congestive) heart failure: Secondary | ICD-10-CM

## 2023-06-18 MED ORDER — LOSARTAN POTASSIUM 100 MG PO TABS
ORAL_TABLET | ORAL | 0 refills | Status: DC
  Filled 2023-06-18: qty 25, 17d supply, fill #0

## 2023-06-20 ENCOUNTER — Other Ambulatory Visit (HOSPITAL_COMMUNITY): Payer: Self-pay

## 2023-06-20 MED ORDER — SPIRONOLACTONE 25 MG PO TABS
25.0000 mg | ORAL_TABLET | Freq: Every day | ORAL | 3 refills | Status: DC
  Filled 2023-06-20: qty 90, 90d supply, fill #0

## 2023-06-20 MED ORDER — SPIRONOLACTONE 25 MG PO TABS
25.0000 mg | ORAL_TABLET | Freq: Every day | ORAL | 0 refills | Status: DC
  Filled 2023-06-20: qty 30, 30d supply, fill #0

## 2023-06-20 NOTE — Telephone Encounter (Signed)
Notified pt she needs to establish with a general cardiologist to continue medication refills and a new referral will be placed.  Patient voice understanding. Refill sent to pharmacy

## 2023-06-20 NOTE — Telephone Encounter (Signed)
Pt was last seen in the CHF clinic, can pt get a refill? Please address

## 2023-07-03 ENCOUNTER — Other Ambulatory Visit (HOSPITAL_COMMUNITY): Payer: Self-pay

## 2023-08-20 ENCOUNTER — Emergency Department (HOSPITAL_COMMUNITY): Payer: Self-pay

## 2023-08-20 ENCOUNTER — Other Ambulatory Visit: Payer: Self-pay

## 2023-08-20 ENCOUNTER — Emergency Department (HOSPITAL_COMMUNITY)
Admission: EM | Admit: 2023-08-20 | Discharge: 2023-08-20 | Disposition: A | Discharge status: Home / Self Care | Payer: Self-pay | Attending: Emergency Medicine | Admitting: Emergency Medicine

## 2023-08-20 DIAGNOSIS — I10 Essential (primary) hypertension: Secondary | ICD-10-CM | POA: Insufficient documentation

## 2023-08-20 DIAGNOSIS — Z79899 Other long term (current) drug therapy: Secondary | ICD-10-CM | POA: Insufficient documentation

## 2023-08-20 LAB — URINALYSIS, ROUTINE W REFLEX MICROSCOPIC
Glucose, UA: NEGATIVE mg/dL
Nitrite: NEGATIVE
Protein, ur: 100 mg/dL — AB
Specific Gravity, Urine: 1.01 (ref 1.005–1.030)
pH: 7 (ref 5.0–8.0)

## 2023-08-20 LAB — CBC
MCH: 29.4 pg (ref 26.0–34.0)
Platelets: 353 10*3/uL (ref 150–400)
RBC: 5.06 MIL/uL (ref 3.87–5.11)
WBC: 7.8 10*3/uL (ref 4.0–10.5)
nRBC: 0 % (ref 0.0–0.2)

## 2023-08-20 LAB — TROPONIN I (HIGH SENSITIVITY)
Troponin I (High Sensitivity): 28 ng/L — ABNORMAL HIGH (ref <18)
Troponin I (High Sensitivity): 27 ng/L — ABNORMAL HIGH (ref <18)

## 2023-08-20 LAB — BASIC METABOLIC PANEL
CO2: 24 mmol/L (ref 22–32)
Chloride: 102 mmol/L (ref 98–111)
Creatinine, Ser: 0.95 mg/dL (ref 0.44–1.00)
GFR, Estimated: >60 mL/min (ref >60)
Sodium: 138 mmol/L (ref 135–145)

## 2023-08-20 MED ORDER — HYDRALAZINE HCL 20 MG/ML IJ SOLN: 10.0000 mg | Freq: Once | INTRAMUSCULAR | Status: DC

## 2023-08-20 MED ORDER — SPIRONOLACTONE 25 MG PO TABS
25.0000 mg | ORAL_TABLET | Freq: Every day | ORAL | 0 refills | Status: DC
  Filled 2023-08-20: qty 30, 30d supply, fill #0

## 2023-08-20 MED ORDER — AMLODIPINE BESYLATE 10 MG PO TABS
10.0000 mg | ORAL_TABLET | Freq: Every day | ORAL | 0 refills | Status: DC
  Filled 2023-08-20: qty 30, 30d supply, fill #0

## 2023-08-20 MED ORDER — CARVEDILOL 12.5 MG PO TABS
12.5000 mg | ORAL_TABLET | Freq: Two times a day (BID) | ORAL | 0 refills | Status: DC
  Filled 2023-08-20: qty 60, 30d supply, fill #0

## 2023-08-20 MED ORDER — LOSARTAN POTASSIUM 100 MG PO TABS
ORAL_TABLET | ORAL | 0 refills | Status: DC
  Filled 2023-08-20: qty 45, 30d supply, fill #0

## 2023-08-20 MED ORDER — HYDRALAZINE HCL 20 MG/ML IJ SOLN
5.0000 mg | Freq: Once | INTRAMUSCULAR | Status: AC
  Administered 2023-08-20: 5 mg via INTRAVENOUS
  Filled 2023-08-20: qty 1

## 2023-08-21 ENCOUNTER — Other Ambulatory Visit (HOSPITAL_COMMUNITY): Payer: Self-pay

## 2023-08-22 ENCOUNTER — Other Ambulatory Visit: Payer: Self-pay | Admitting: Cardiology

## 2023-08-23 ENCOUNTER — Other Ambulatory Visit (HOSPITAL_COMMUNITY): Payer: Self-pay

## 2023-08-23 MED ORDER — FLECAINIDE ACETATE 100 MG PO TABS
100.0000 mg | ORAL_TABLET | Freq: Two times a day (BID) | ORAL | 0 refills | Status: DC
  Filled 2023-08-23: qty 30, 15d supply, fill #0

## 2023-08-23 NOTE — Telephone Encounter (Signed)
Pt has an upcoming appt in November 2024 in the CHF clinic. Please address

## 2023-09-12 ENCOUNTER — Encounter (HOSPITAL_COMMUNITY): Payer: Self-pay

## 2023-09-18 ENCOUNTER — Other Ambulatory Visit: Payer: Self-pay | Admitting: Cardiology

## 2023-09-18 ENCOUNTER — Other Ambulatory Visit (HOSPITAL_COMMUNITY): Payer: Self-pay

## 2023-09-19 ENCOUNTER — Other Ambulatory Visit (HOSPITAL_COMMUNITY): Payer: Self-pay

## 2023-09-19 MED ORDER — FLECAINIDE ACETATE 100 MG PO TABS
100.0000 mg | ORAL_TABLET | Freq: Two times a day (BID) | ORAL | 0 refills | Status: DC
  Filled 2023-09-19: qty 30, 15d supply, fill #0

## 2023-09-24 ENCOUNTER — Other Ambulatory Visit (HOSPITAL_COMMUNITY): Payer: Self-pay

## 2023-09-27 ENCOUNTER — Other Ambulatory Visit (HOSPITAL_COMMUNITY): Payer: Self-pay

## 2023-10-12 ENCOUNTER — Other Ambulatory Visit (HOSPITAL_COMMUNITY): Payer: Self-pay

## 2023-10-18 ENCOUNTER — Other Ambulatory Visit: Payer: Self-pay | Admitting: Cardiology

## 2023-10-18 ENCOUNTER — Other Ambulatory Visit: Payer: Self-pay

## 2023-10-18 ENCOUNTER — Other Ambulatory Visit (HOSPITAL_COMMUNITY): Payer: Self-pay

## 2023-10-18 ENCOUNTER — Other Ambulatory Visit (HOSPITAL_COMMUNITY): Payer: Self-pay | Admitting: Cardiology

## 2023-10-18 MED ORDER — FLECAINIDE ACETATE 100 MG PO TABS
100.0000 mg | ORAL_TABLET | Freq: Two times a day (BID) | ORAL | 0 refills | Status: DC
  Filled 2023-10-18: qty 30, 15d supply, fill #0

## 2023-10-18 MED ORDER — HYDRALAZINE HCL 100 MG PO TABS
100.0000 mg | ORAL_TABLET | Freq: Three times a day (TID) | ORAL | 0 refills | Status: DC
  Filled 2023-10-18: qty 90, 30d supply, fill #0

## 2023-10-19 ENCOUNTER — Other Ambulatory Visit (HOSPITAL_COMMUNITY): Payer: Self-pay

## 2023-10-29 ENCOUNTER — Ambulatory Visit: Payer: Self-pay | Admitting: Physician Assistant

## 2023-10-29 ENCOUNTER — Ambulatory Visit: Payer: Self-pay | Admitting: Student

## 2023-10-31 ENCOUNTER — Other Ambulatory Visit (HOSPITAL_COMMUNITY): Payer: Self-pay

## 2023-11-25 ENCOUNTER — Other Ambulatory Visit (HOSPITAL_COMMUNITY): Payer: Self-pay

## 2023-11-25 ENCOUNTER — Other Ambulatory Visit: Payer: Self-pay | Admitting: Cardiology

## 2023-11-28 NOTE — Telephone Encounter (Signed)
 This is [name] Pt. She is passed her 3rd attempt and has been cancelling several appts. Does Dr. Lawana Pray want to refill? Please advise.

## 2023-12-03 ENCOUNTER — Ambulatory Visit (HOSPITAL_BASED_OUTPATIENT_CLINIC_OR_DEPARTMENT_OTHER): Payer: Self-pay | Admitting: Cardiovascular Disease

## 2023-12-03 NOTE — Telephone Encounter (Signed)
 Cannot refill at this time.  If pt would like to call the office to discuss this with me, great!  But would need to speak with her before deciding if willing to refill since she has cancelled several follow ups. Let me know if needed further. Thanks

## 2023-12-04 ENCOUNTER — Other Ambulatory Visit (HOSPITAL_COMMUNITY): Payer: Self-pay

## 2023-12-14 ENCOUNTER — Telehealth: Payer: Self-pay | Admitting: Cardiology

## 2023-12-14 NOTE — Telephone Encounter (Signed)
 Spoke to pt. Aware that she was last seen by Dr. Gala Romney 07/2022 and Dr. Elberta Fortis 12/2021 and not sure if can send in refills as she has cancelled many appts. Aware that Dr. Elberta Fortis is following Flecainide & Carvedilol and other refills request would go through the advanced heart failure clinic. States she does not currently have insurance and is paying out of pocket, which is why she has had to cancel appts. Will forward to both MDs for advisement Pt aware office will follow up once MDs advise.....  Forwarding to our scheduler to arrange overdue f/u w/ EP...Marland KitchenMarland Kitchen

## 2023-12-14 NOTE — Telephone Encounter (Signed)
 LMOM to schedule fu with AHF clinic

## 2023-12-14 NOTE — Telephone Encounter (Signed)
*  STAT* If patient is at the pharmacy, call can be transferred to refill team.   1. Which medications need to be refilled? (please list name of each medication and dose if known)   amLODipine (NORVASC) 10 MG tablet  (30 day supply) carvedilol (COREG) 12.5 MG tablet  (90-day supply) flecainide (TAMBOCOR) 100 MG tablet  (90-day supply) hydrALAZINE (APRESOLINE) 100 MG tablet  (30-day supply) losartan (COZAAR) 100 MG tablet  (90-day supply) spironolactone (ALDACTONE) 25 MG tablet  (30 day supply)  2. Would you like to learn more about the convenience, safety, & potential cost savings by using the [facility]?   3. Are you open to using the [facility] (Type [facility]. ).  4. Which pharmacy/location (including street and city if local pharmacy) is medication to be sent to?  [facility]   5. Do they need a 30 day or 90 day supply?   See above  Patient stated she is almost/completely out of these medications.

## 2023-12-14 NOTE — Telephone Encounter (Signed)
 Pt aware Dr. Elberta Fortis says she will need to be seen for more refills from an EP standpoint. Will send to scheduler to arrange EP APP OV. Pt aware still awaiting heart failure clinic response and they should be in touch with her.

## 2023-12-14 NOTE — Telephone Encounter (Signed)
 Pt has not been seen since 12/29/2021 and pt has had numerous attempts and appts and pt has cancelled all appts and pt is requesting refills on her medication again. Would Dr. Alvin Critchley like to refill these medications without seeing pt? Please address

## 2023-12-17 ENCOUNTER — Other Ambulatory Visit (HOSPITAL_COMMUNITY): Payer: Self-pay

## 2023-12-17 ENCOUNTER — Encounter (HOSPITAL_COMMUNITY): Payer: Self-pay

## 2023-12-17 ENCOUNTER — Ambulatory Visit (HOSPITAL_COMMUNITY)
Admission: RE | Admit: 2023-12-17 | Discharge: 2023-12-17 | Disposition: A | Discharge status: Home / Self Care | Payer: Self-pay | Source: Ambulatory Visit | Attending: Family Medicine

## 2023-12-17 VITALS — BP 186/120 | HR 115 | Wt 226.0 lb

## 2023-12-17 DIAGNOSIS — I493 Ventricular premature depolarization: Secondary | ICD-10-CM

## 2023-12-17 DIAGNOSIS — I160 Hypertensive urgency: Secondary | ICD-10-CM

## 2023-12-17 DIAGNOSIS — Z79899 Other long term (current) drug therapy: Secondary | ICD-10-CM | POA: Insufficient documentation

## 2023-12-17 DIAGNOSIS — I428 Other cardiomyopathies: Secondary | ICD-10-CM | POA: Insufficient documentation

## 2023-12-17 DIAGNOSIS — E669 Obesity, unspecified: Secondary | ICD-10-CM | POA: Insufficient documentation

## 2023-12-17 DIAGNOSIS — Z5971 Insufficient health insurance coverage: Secondary | ICD-10-CM | POA: Insufficient documentation

## 2023-12-17 DIAGNOSIS — Z91141 Patient's other noncompliance with medication regimen due to financial hardship: Secondary | ICD-10-CM | POA: Insufficient documentation

## 2023-12-17 DIAGNOSIS — Z139 Encounter for screening, unspecified: Secondary | ICD-10-CM

## 2023-12-17 DIAGNOSIS — I5022 Chronic systolic (congestive) heart failure: Secondary | ICD-10-CM

## 2023-12-17 DIAGNOSIS — D571 Sickle-cell disease without crisis: Secondary | ICD-10-CM | POA: Insufficient documentation

## 2023-12-17 DIAGNOSIS — I110 Hypertensive heart disease with heart failure: Secondary | ICD-10-CM | POA: Insufficient documentation

## 2023-12-17 LAB — BRAIN NATRIURETIC PEPTIDE: B Natriuretic Peptide: 133.3 pg/mL — ABNORMAL HIGH (ref 0.0–100.0)

## 2023-12-17 LAB — CBC
HCT: 42.6 % (ref 36.0–46.0)
Hemoglobin: 14.2 g/dL (ref 12.0–15.0)
MCH: 30.2 pg (ref 26.0–34.0)
MCHC: 33.3 g/dL (ref 30.0–36.0)
MCV: 90.6 fL (ref 80.0–100.0)
Platelets: 348 10*3/uL (ref 150–400)
RBC: 4.7 MIL/uL (ref 3.87–5.11)
RDW: 13.4 % (ref 11.5–15.5)
WBC: 9 10*3/uL (ref 4.0–10.5)
nRBC: 0 % (ref 0.0–0.2)

## 2023-12-17 LAB — COMPREHENSIVE METABOLIC PANEL
ALT: 16 U/L (ref 0–44)
AST: 18 U/L (ref 15–41)
Albumin: 3.4 g/dL — ABNORMAL LOW (ref 3.5–5.0)
Alkaline Phosphatase: 62 U/L (ref 38–126)
Anion gap: 10 (ref 5–15)
BUN: 12 mg/dL (ref 6–20)
CO2: 25 mmol/L (ref 22–32)
Calcium: 9.2 mg/dL (ref 8.9–10.3)
Chloride: 100 mmol/L (ref 98–111)
Creatinine, Ser: 0.96 mg/dL (ref 0.44–1.00)
GFR, Estimated: >60 mL/min (ref >60)
Glucose, Bld: 104 mg/dL — ABNORMAL HIGH (ref 70–99)
Potassium: 4 mmol/L (ref 3.5–5.1)
Sodium: 135 mmol/L (ref 135–145)
Total Bilirubin: 0.6 mg/dL (ref 0.0–1.2)
Total Protein: 7.5 g/dL (ref 6.5–8.1)

## 2023-12-17 LAB — TSH: TSH: 2.446 u[IU]/mL (ref 0.350–4.500)

## 2023-12-17 LAB — HEMOGLOBIN A1C
Hgb A1c MFr Bld: 5.6 % (ref 4.8–5.6)
Mean Plasma Glucose: 114.02 mg/dL

## 2023-12-17 MED ORDER — FLECAINIDE ACETATE 100 MG PO TABS
100.0000 mg | ORAL_TABLET | Freq: Two times a day (BID) | ORAL | 8 refills | Status: DC
  Filled 2023-12-17: qty 60, 30d supply, fill #0
  Filled 2024-01-15: qty 60, 30d supply, fill #1
  Filled 2024-02-10: qty 60, 30d supply, fill #2

## 2023-12-17 MED ORDER — HYDRALAZINE HCL 100 MG PO TABS
100.0000 mg | ORAL_TABLET | Freq: Three times a day (TID) | ORAL | 8 refills | Status: DC
  Filled 2023-12-17: qty 90, 30d supply, fill #0
  Filled 2024-02-10: qty 90, 30d supply, fill #1

## 2023-12-17 MED ORDER — AMLODIPINE BESYLATE 10 MG PO TABS
10.0000 mg | ORAL_TABLET | Freq: Every day | ORAL | 8 refills | Status: DC
  Filled 2023-12-17: qty 30, 30d supply, fill #0
  Filled 2024-01-12: qty 30, 30d supply, fill #1
  Filled 2024-02-10: qty 30, 30d supply, fill #2
  Filled 2024-03-31: qty 30, 30d supply, fill #3
  Filled 2024-04-23: qty 30, 30d supply, fill #4
  Filled 2024-05-22: qty 30, 30d supply, fill #5
  Filled 2024-06-26: qty 30, 30d supply, fill #6
  Filled 2024-07-22: qty 30, 30d supply, fill #7
  Filled 2024-08-25: qty 30, 30d supply, fill #8

## 2023-12-17 MED ORDER — SPIRONOLACTONE 25 MG PO TABS
25.0000 mg | ORAL_TABLET | Freq: Every day | ORAL | 8 refills | Status: DC
  Filled 2023-12-17: qty 30, 30d supply, fill #0
  Filled 2024-01-12: qty 30, 30d supply, fill #1
  Filled 2024-02-10: qty 30, 30d supply, fill #2

## 2023-12-17 MED ORDER — LOSARTAN POTASSIUM 100 MG PO TABS
ORAL_TABLET | ORAL | 8 refills | Status: DC
  Filled 2023-12-17: qty 45, 30d supply, fill #0
  Filled 2024-01-12: qty 45, 30d supply, fill #1
  Filled 2024-02-10: qty 45, 30d supply, fill #2

## 2023-12-17 MED ORDER — BLOOD PRESSURE CUFF MISC
0 refills | Status: AC
  Filled 2023-12-17: qty 1, fill #0

## 2023-12-17 MED ORDER — CARVEDILOL 12.5 MG PO TABS
12.5000 mg | ORAL_TABLET | Freq: Two times a day (BID) | ORAL | 8 refills | Status: DC
  Filled 2023-12-17: qty 60, 30d supply, fill #0
  Filled 2024-01-15: qty 60, 30d supply, fill #1
  Filled 2024-02-10: qty 60, 30d supply, fill #2
  Filled 2024-04-07: qty 60, 30d supply, fill #3
  Filled 2024-05-06: qty 60, 30d supply, fill #4
  Filled 2024-06-06: qty 60, 30d supply, fill #5
  Filled 2024-07-04: qty 60, 30d supply, fill #6
  Filled 2024-08-03: qty 60, 30d supply, fill #7
  Filled 2024-09-19: qty 60, 30d supply, fill #8

## 2023-12-17 NOTE — Patient Instructions (Addendum)
 Thank you for coming in today  If you had labs drawn today, any labs that are abnormal the clinic will call you No news is good news  You were given a prescription for a Blood pressure cuff  You were given List of primary care clinics and doctors.  YOU HAVE A APPOINTMENT WITH DR. Elberta Fortis NURSE PRACTITIONER  TOMORROW 12/18/23 AT 2:20 PM   Medications: Refills have been sent to [facility]  Follow up appointments:  Your physician recommends that you schedule a follow-up appointment in:  2 weeks in clinic   Do the following things EVERYDAY: Weigh yourself in the morning before breakfast. Write it down and keep it in a log. Take your medicines as prescribed Eat low salt foods--Limit salt (sodium) to 2000 mg per day.  Stay as active as you can everyday Limit all fluids for the day to less than 2 liters   At the [facility], you and your health needs are our priority. As part of our continuing mission to provide you with exceptional heart care, we have created designated Provider Care Teams. These Care Teams include your primary Cardiologist (physician) and Advanced Practice Providers (APPs- Physician Assistants and Nurse Practitioners) who all work together to provide you with the care you need, when you need it.   You may see any of the following providers on your designated Care Team at your next follow up: Dr Arvilla Meres Dr Marca Ancona Dr. Marcos Eke, NP Robbie Lis, [place] [facility] [place], [place] Brynda Peon, NP Karle Plumber, PharmD   Please be sure to bring in all your medications bottles to every appointment.    Thank you for choosing [facility]  If you have any questions or concerns before your next appointment please send [place] a message through [place] or call our office at [phone].    TO LEAVE A MESSAGE FOR THE NURSE SELECT OPTION 2, PLEASE LEAVE A MESSAGE  INCLUDING: YOUR NAME DATE OF BIRTH CALL BACK NUMBER REASON FOR CALL**this is important as we prioritize the call backs  YOU WILL RECEIVE A CALL BACK THE SAME DAY AS LONG AS YOU CALL BEFORE 4:00 PM

## 2023-12-17 NOTE — Telephone Encounter (Signed)
 Appt scheduled

## 2023-12-17 NOTE — Progress Notes (Unsigned)
  Electrophysiology Office Note:   Date:  12/18/2023  ID:  Mandy Serrano, Mandy Serrano Mar 18, 1976, MRN [id number]  Primary Cardiologist: None Primary Heart Failure: None Electrophysiologist: None      History of Present Illness:   Mandy Serrano is a 48 y.o. female with h/o PVC's, HTN, HFrEF, sickle cell seen today for routine electrophysiology followup.   Since last being seen in our clinic the patient reports she had been off her medications for approximately 2 weeks. She was seen in the HF Clinic on 2/24 and was restarted on her medications. She started her blood pressure meds last night and took them this am as well. She noted a slight headache and mild dizziness but otherwise feels well.  She denies chest pain, palpitations, dyspnea, PND, orthopnea, nausea, vomiting, dizziness, syncope, edema, weight gain, or early satiety.   Review of systems complete and found to be negative unless listed in HPI.   EP Information / Studies Reviewed:    EKG is not ordered today. EKG from 12/17/23 reviewed which showed ST 106 bpm, PVC's      Studies:  cMRI 05/2016 > moderate LV dilatation with moderate concentric hypertrophy & severely decreased systolic function (LVEF 20%)  ECHO 2019 > LVEF 60-65% Cardiac Monitor 12/2021 > 6% PVC's   Arrhythmia / AAD PVC's > dx ~2017 Amiodarone > PVC's reduced / LVEF recovered, stopped and PVC's returned  Flecainide     Risk Assessment/Calculations:     HYPERTENSION CONTROL Vitals:   12/18/23 1421  BP: (!) 160/110    The patient's blood pressure is elevated above target today.  In order to address the patient's elevated BP: Blood pressure will be monitored at home to determine if medication changes need to be made.           Physical Exam:   VS:  BP (!) 160/110   Pulse 84   Ht 5\' 5"  (1.651 m)   Wt 225 lb (102.1 kg)   SpO2 99%   BMI 37.44 kg/m    Wt Readings from Last 3 Encounters:  12/18/23 225 lb (102.1 kg)  12/17/23 226 lb (102.5 kg)  08/09/22 228  lb 6.4 oz (103.6 kg)     GEN: Well nourished, well developed in no acute distress NECK: No JVD; No carotid bruits CARDIAC: Regular rate and rhythm, no murmurs, rubs, gallops RESPIRATORY:  Clear to auscultation without rales, wheezing or rhonchi  ABDOMEN: Soft, non-tender, non-distended EXTREMITIES:  No edema; No deformity   ASSESSMENT AND PLAN:    PVC's  Elevated in 2017, low LVEF > initially treated with amiodarone, LVEF recovered and then amio stopped with return of PVC's.  Subsequently started on flecainide. Most recent monitor 2023 had 6% PVC burden.  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

## 2023-12-17 NOTE — Progress Notes (Signed)
 H&V Care Navigation CSW Progress Note  Clinical Social Worker Clinical Social Worker consulted to speak with patient regarding lack of insurance.  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  States only source of income for household is her spouse who gets around $1,500/month- with this income should qualify for Medicaid based on income if she qualifies otherwise.  Provided pt with information about [facility] office- she plans to go after visit today.  Also provided pt with CAFA to help with past due bills with [facility]- she will plan to complete and turn in.  Pt reports no other concerns or questions at this time.  SDOH Screenings   Financial Resource Strain: Medium Risk (12/17/2023)  Tobacco Use: Low Risk  (12/29/2021)    Burna Sis, LCSW Clinical Social Worker [facility] Desk#: [phone] Cell#: [phone]

## 2023-12-17 NOTE — Progress Notes (Addendum)
 ADVANCED HF CLINIC NOTE  PCP: None  HF Cardiologist: Dr Gala Romney   HPI Mandy Serrano is a 48 y.o. with history of obesity, HTN  was on 5 anti-HTN medicines in [place]), sickle cell anemia, systolic heart failure 05/2016, headaches, and frequent PVCs.  Admitted 8/17 with increased dyspnea and HTN crisis. She had not been taking any medications over the last last year. New acute systolic heart identified on ECHO. LVEF ~25%. CMRI no infiltrative/inflamatory process and EF ~25%. Had high PVC burden (16%) so she was started on amio. Amio eventually switch to flecainide as EF recovered.   Unable to obtain Sherryll Burger because she is not [place] citizen.    Echo 7/19 EF 60-65%.  Bedside echo 04/2020 EF 60-65%.  Last seen in AHF clinic 05/2022--> graduated from AHF clinic, and referred to HTN clinic.  Today she returns for HF follow up. We have not seen her since 8/23, at which time she graduated from the AHF clinic. She is not SOB walking up steps. 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 Main complaint is HA today. Denies palpitations, abnormal bleeding, CP, dizziness, edema, or PND/Orthopnea. Appetite ok. No fever or chills. Weight at home 226 pounds. Off all meds except hydral for past 2-3 weeks. No ETOH/tobacco/drugs at home. She is uninsured, non-citizen, has transportation but difficulty paying for meds.  Cardiac Studies - Zio 3/23: 6% PVC burden - Bedside echo 7/21: EF 60-65% - Echo 7/19: EF 60-65% - Echo 12/17: EF 55% - Echo 8/17: EF ~25-30%.  - cMRI 8/17: LVEF 25%, no infiltrative/inflamatory process    Review of systems complete and found to be negative unless listed in HPI.   SH:  Social History   Socioeconomic History   Marital status: Married    Spouse name: Not on file   Number of children: Not on file   Years of education: Not on file   Highest education level: Not on file  Occupational History   Not on file  Tobacco Use   Smoking status: Never    Passive  exposure: Past   Smokeless tobacco: Never  Substance and Sexual Activity   Alcohol use: No   Drug use: No   Sexual activity: Not on file  Other Topics Concern   Not on file  Social History Narrative   Not on file   Social Drivers of Health   Financial Resource Strain: Not on file  Food Insecurity: Not on file  Transportation Needs: Not on file  Physical Activity: Not on file  Stress: Not on file  Social Connections: Not on file  Intimate Partner Violence: Not on file    FH:  Family History  Problem Relation Age of Onset   Hypertension Mother    Hypertension Father     Past Medical History:  Diagnosis Date   Malignant hypertension     Current Outpatient Medications  Medication Sig Dispense Refill   acetaminophen (TYLENOL) 500 MG tablet Take 500 mg by mouth every 6 (six) hours as needed for mild pain or headache.     ASPIR-LOW 81 MG EC tablet TAKE 1 TABLET BY MOUTH DAILY. 30 tablet 3   hydrALAZINE (APRESOLINE) 100 MG tablet Take 1 tablet (100 mg total) by mouth 3 (three) times daily. 90 tablet 0   amLODipine (NORVASC) 10 MG tablet Take 1 tablet (10 mg total) by mouth daily. (Patient not taking: Reported on 12/17/2023) 90 tablet 0   amLODipine (NORVASC) 10 MG tablet Take 1 tablet (10 mg  total) by mouth daily. (Patient not taking: Reported on 12/17/2023) 30 tablet 0   carvedilol (COREG) 12.5 MG tablet Take 1 tablet (12.5 mg total) by mouth 2 (two) times daily with a meal. (Patient not taking: Reported on 12/17/2023) 180 tablet 0   flecainide (TAMBOCOR) 100 MG tablet Take 1 tablet (100 mg total) by mouth 2 (two) times daily. *need appt* (Patient not taking: Reported on 12/17/2023) 30 tablet 0   losartan (COZAAR) 100 MG tablet Take 1 tablet (100 mg total) by mouth in the morning AND 1/2 tablet (50mg ) every evening. Pt needs to schedule appt with Dr. Elberta Fortis for further refills - 3rd attempt. (Patient not taking: Reported on 12/17/2023) 25 tablet 0   losartan (COZAAR) 100 MG tablet  Take 1 tablet (100 mg total) by mouth in the morning AND 0.5 tablets (50 mg total) at bedtime. (Patient not taking: Reported on 12/17/2023) 45 tablet 0   spironolactone (ALDACTONE) 25 MG tablet Take 1 tablet (25 mg total) by mouth daily. (Patient not taking: Reported on 12/17/2023) 30 tablet 0   spironolactone (ALDACTONE) 25 MG tablet Take 1 tablet (25 mg total) by mouth daily. (Patient not taking: Reported on 12/17/2023) 30 tablet 0   No current facility-administered medications for this encounter.   Wt Readings from Last 3 Encounters:  12/17/23 102.5 kg (226 lb)  08/09/22 103.6 kg (228 lb 6.4 oz)  12/29/21 102.9 kg (226 lb 12.8 oz)   BP (!) 186/120   Pulse (!) 115   Wt 102.5 kg (226 lb)   SpO2 98%   BMI 36.48 kg/m   PHYSICAL EXAM: General:  NAD. No resp difficulty, walked into clinic HEENT: Normal Neck: Supple. No JVD. Cor: Regular rate & rhythm. No rubs, gallops or murmurs. Lungs: Clear Abdomen: Soft, obese, nontender, nondistended.  Extremities: No cyanosis, clubbing, rash, edema Neuro: Alert & oriented x 3, moves all 4 extremities w/o difficulty. Affect pleasant.  ECG (personally reviewed): ST with PVC 106 bpm  ASSESSMENT & PLAN:  1. 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  - Echo 8/17: EF ~25-30%.  - cMRI no infiltrative/inflamatory process and EF ~25%.   - Echo 12/17: EF recovery, EF up to 55%.  - Echo 7/19: EF 60-65% - Bedside echo 05/10/20: EF 60-65%  - Remains NYHA I-II, volume looks OK today. - Off all meds x 2 weeks. - Restart Coreg 12.5 mg bid - Restart spiro 50 mg daily - Restart losartan 100 mg am/50 mg pm - Continue hydralazine 100 mg tid - Labs today, repeat BMET at follow up - Plan to repeat echo to ensure EF stable, then can likely graduate AHF clinic.  2. Hypertensive Urgency - Uncontrolled, off most meds - Restart meds as above - Restart amlodipine 10 mg daily - Given Rx for BP cuff - realizes need for weight loss and more  activity - with refractory HTN, she needs sleep study but cannot afford without insurance - I asked her to pick up her meds today  - Labs today - Refer to HTN clinic next visit.  3. PVCs - While in hospital had 16% PVCs in 8/17. Amio added. PVCs suppressed with EF recovery. Off amio and PVCs back.  - Holter monitor 01/2018 with rare PVCs on flecainide (0.49%) - Started on flecanide per EP. She declined ablation.  - Restart flecainide 100 mg bid.  - Follows with Dr Elberta Fortis, she is over-due for an appt. Will reach out to their office to arrange follow up -  PVCs on ECG today, asymptomatic - As above, needs sleep study  4. SDOH - HF meds thru HF fund, send Rx to [facility] outpatient pharmacy - Given list of PCPs to establish care - She is uninsured, non-citizen (she is a legal permanent resident) - Engage HFSW to help with resources.  Follow up in 2 weeks with APP for med and BP check. Will need echo when back to GDMT to ensure EF stable.  Mandy Ganong, FNP 10:39 AM

## 2023-12-18 ENCOUNTER — Encounter: Payer: Self-pay | Admitting: Pulmonary Disease

## 2023-12-18 ENCOUNTER — Ambulatory Visit: Payer: Self-pay | Attending: Pulmonary Disease | Admitting: Pulmonary Disease

## 2023-12-18 VITALS — BP 160/110 | HR 84 | Ht 65.0 in | Wt 225.0 lb

## 2023-12-18 DIAGNOSIS — I493 Ventricular premature depolarization: Secondary | ICD-10-CM

## 2023-12-18 DIAGNOSIS — I5022 Chronic systolic (congestive) heart failure: Secondary | ICD-10-CM

## 2023-12-18 DIAGNOSIS — I10 Essential (primary) hypertension: Secondary | ICD-10-CM

## 2023-12-18 NOTE — Patient Instructions (Signed)
 Medication Instructions:  Your physician recommends that you continue on your current medications as directed. Please refer to the Current Medication list given to you today.  *If you need a refill on your cardiac medications before your next appointment, please call your pharmacy*  Lab Work: None ordered If you have labs (blood work) drawn today and your tests are completely normal, you will receive your results only by: MyChart Message (if you have MyChart) OR A paper copy in the mail If you have any lab test that is abnormal or we need to change your treatment, we will call you to review the results.  Follow-Up: At [facility], you and your health needs are our priority.  As part of our continuing mission to provide you with exceptional heart care, we have created designated Provider Care Teams.  These Care Teams include your primary Cardiologist (physician) and Advanced Practice Providers (APPs -  Physician Assistants and Nurse Practitioners) who all work together to provide you with the care you need, when you need it.  We recommend signing up for the patient portal called "MyChart".  Sign up information is provided on this After Visit Summary.  MyChart is used to connect with patients for Virtual Visits (Telemedicine).  Patients are able to view lab/test results, encounter notes, upcoming appointments, etc.  Non-urgent messages can be sent to your provider as well.   To learn more about what you can do with MyChart, go to [email or web address].    Your next appointment:   1 month(s)  Provider:   Canary Brim, NP    Schedule one week nurse for an EKG for flecainide.

## 2023-12-25 ENCOUNTER — Ambulatory Visit: Payer: Self-pay | Attending: Internal Medicine

## 2023-12-25 VITALS — BP 150/100 | HR 75 | Ht 65.0 in | Wt 225.0 lb

## 2023-12-25 DIAGNOSIS — I5021 Acute systolic (congestive) heart failure: Secondary | ICD-10-CM

## 2023-12-25 DIAGNOSIS — Z79899 Other long term (current) drug therapy: Secondary | ICD-10-CM

## 2023-12-25 DIAGNOSIS — I493 Ventricular premature depolarization: Secondary | ICD-10-CM

## 2023-12-25 DIAGNOSIS — I10 Essential (primary) hypertension: Secondary | ICD-10-CM

## 2023-12-25 NOTE — Progress Notes (Signed)
 EKG Reviewed: SR 1st HB. No further PVCs. BP has improved.   On concomittant Coreg. PVCs have resolved.  No escalation in therapy. Narrow complex EKG Tx as per Canary Brim NP  Riley Lam, MD FASE [facility] Cardiologist [facility]  [address] [place], [address] [place], [place] [address] [phone]  3:14 PM

## 2023-12-25 NOTE — Patient Instructions (Addendum)
 Medication Instructions:  Your physician recommends that you continue on your current medications as directed. Please refer to the Current Medication list given to you today.  *If you need a refill on your cardiac medications before your next appointment, please call your pharmacy*  Lab Work: If you have labs (blood work) drawn today and your tests are completely normal, you will receive your results only by: MyChart Message (if you have MyChart) OR A paper copy in the mail If you have any lab test that is abnormal or we need to change your treatment, we will call you to review the results.  Follow-Up: At [facility], you and your health needs are our priority.  As part of our continuing mission to provide you with exceptional heart care, we have created designated Provider Care Teams.  These Care Teams include your primary Cardiologist (physician) and Advanced Practice Providers (APPs -  Physician Assistants and Nurse Practitioners) who all work together to provide you with the care you need, when you need it.  We recommend signing up for the patient portal called "MyChart".  Sign up information is provided on this After Visit Summary.  MyChart is used to connect with patients for Virtual Visits (Telemedicine).  Patients are able to view lab/test results, encounter notes, upcoming appointments, etc.  Non-urgent messages can be sent to your provider as well.   To learn more about what you can do with MyChart, go to [email or web address].    Your next appointment:   Already scheduled  Other Instructions       1st Floor: - Lobby - Registration  - Pharmacy  - Lab - Cafe  2nd Floor: - PV Lab - Diagnostic Testing (echo, CT, nuclear med)  3rd Floor: - Vacant  4th Floor: - TCTS (cardiothoracic surgery) - AFib Clinic - Structural Heart Clinic - Vascular Surgery  - Vascular Ultrasound  5th Floor: - HeartCare Cardiology (general and EP) - Clinical Pharmacy for  coumadin, hypertension, lipid, weight-loss medications, and med management appointments    Valet parking services will be available as well.

## 2023-12-25 NOTE — Progress Notes (Signed)
   Nurse Visit   Date of Encounter: 12/25/2023 ID: Mandy Serrano, DOB 06-11-1976, MRN [id number]  PCP:  Pcp, No   [facility] Providers Cardiologist:  None      Visit Details   VS:  BP (!) 150/100   Pulse 75   Ht 5\' 5"  (1.651 m)   Wt 225 lb (102.1 kg)   SpO2 100%   BMI 37.44 kg/m  , BMI Body mass index is 37.44 kg/m.  Wt Readings from Last 3 Encounters:  12/25/23 225 lb (102.1 kg)  12/18/23 225 lb (102.1 kg)  12/17/23 226 lb (102.5 kg)     Reason for visit: ekg Performed today: Vitals, EKG, Provider consulted:Dr. Izora Ribas, and Education Changes (medications, testing, etc.) : No Change Length of Visit: 15 minutes    Medications Adjustments/Labs and Tests Ordered: Orders Placed This Encounter  Procedures   EKG 12-Lead   No orders of the defined types were placed in this encounter.    Signed, Ethelda Chick, RN  12/25/2023 3:32 PM

## 2024-01-01 NOTE — Progress Notes (Unsigned)
  Electrophysiology Office Note:   Date:  01/01/2024  ID:  Mandy Serrano, DOB [birth year], MRN [id number]  Primary Cardiologist: None Primary Heart Failure: None Electrophysiologist: Mandy Jorja Loa, MD  {Click to update primary MD,subspecialty MD or APP then REFRESH:1}    History of Present Illness:   Mandy Serrano is a 48 y.o. female with h/o PVC's, HTN, HFrEF, sickle cell  seen today for routine electrophysiology followup.   Seen by HF Clinic 12/17/23 and was restarted on all her medications. She followed up in EP Clinic 12/18/23 and   Since last being seen in our clinic the patient reports doing ***.  she denies chest pain, palpitations, dyspnea, PND, orthopnea, nausea, vomiting, dizziness, syncope, edema, weight gain, or early satiety.   Review of systems complete and found to be negative unless listed in HPI.   EP Information / Studies Reviewed:    EKG is ordered today. Personal review as below.      Studies:  cMRI 05/2016 > moderate LV dilatation with moderate concentric hypertrophy & severely decreased systolic function (LVEF 20%)  ECHO 2019 > LVEF 60-65% Cardiac Monitor 12/2021 > 6% PVC's     Arrhythmia / AAD PVC's > dx ~2017 Amiodarone > PVC's reduced / LVEF recovered, stopped and PVC's returned  Flecainide    Risk Assessment/Calculations:     No BP recorded.  {Refresh Note OR Click here to enter BP  :1}***        Physical Exam:   VS:  There were no vitals taken for this visit.   Wt Readings from Last 3 Encounters:  12/25/23 225 lb (102.1 kg)  12/18/23 225 lb (102.1 kg)  12/17/23 226 lb (102.5 kg)     GEN: Well nourished, well developed in no acute distress NECK: No JVD; No carotid bruits CARDIAC: {EPRHYTHM:28826}, no murmurs, rubs, gallops RESPIRATORY:  Clear to auscultation without rales, wheezing or rhonchi  ABDOMEN: Soft, non-tender, non-distended EXTREMITIES:  No edema; No deformity   ASSESSMENT AND PLAN:    PVC's  Elevated in 2017, low LVEF >  initially treated with amiodarone, LVEF recovered and then amio stopped with return of PVC's.  Subsequently started on flecainide. Most recent monitor 2023 had 6% PVC burden.  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 Did she go??/don't see she went ***  Follow up with Dr. Elberta Fortis {EPFOLLOW WU:98119}  Signed, Canary Brim, NP-C, AGACNP-BC [facility] - Electrophysiology  01/01/2024, 6:23 PM

## 2024-01-02 ENCOUNTER — Other Ambulatory Visit (HOSPITAL_COMMUNITY): Payer: Self-pay

## 2024-01-02 ENCOUNTER — Encounter (HOSPITAL_COMMUNITY): Payer: Self-pay

## 2024-01-02 ENCOUNTER — Ambulatory Visit (HOSPITAL_COMMUNITY)
Admission: RE | Admit: 2024-01-02 | Discharge: 2024-01-02 | Disposition: A | Discharge status: Home / Self Care | Payer: Self-pay | Source: Ambulatory Visit | Attending: Family Medicine | Admitting: Family Medicine

## 2024-01-02 VITALS — BP 150/94 | HR 79 | Wt 224.8 lb

## 2024-01-02 DIAGNOSIS — I493 Ventricular premature depolarization: Secondary | ICD-10-CM

## 2024-01-02 DIAGNOSIS — D571 Sickle-cell disease without crisis: Secondary | ICD-10-CM | POA: Insufficient documentation

## 2024-01-02 DIAGNOSIS — I428 Other cardiomyopathies: Secondary | ICD-10-CM | POA: Insufficient documentation

## 2024-01-02 DIAGNOSIS — I10 Essential (primary) hypertension: Secondary | ICD-10-CM

## 2024-01-02 DIAGNOSIS — Z5971 Insufficient health insurance coverage: Secondary | ICD-10-CM | POA: Insufficient documentation

## 2024-01-02 DIAGNOSIS — I5022 Chronic systolic (congestive) heart failure: Secondary | ICD-10-CM

## 2024-01-02 DIAGNOSIS — Z139 Encounter for screening, unspecified: Secondary | ICD-10-CM

## 2024-01-02 DIAGNOSIS — Z79899 Other long term (current) drug therapy: Secondary | ICD-10-CM | POA: Insufficient documentation

## 2024-01-02 DIAGNOSIS — I110 Hypertensive heart disease with heart failure: Secondary | ICD-10-CM | POA: Insufficient documentation

## 2024-01-02 DIAGNOSIS — E669 Obesity, unspecified: Secondary | ICD-10-CM | POA: Insufficient documentation

## 2024-01-02 DIAGNOSIS — Z5986 Financial insecurity: Secondary | ICD-10-CM | POA: Insufficient documentation

## 2024-01-02 LAB — BASIC METABOLIC PANEL
Anion gap: 8 (ref 5–15)
BUN: 19 mg/dL (ref 6–20)
CO2: 23 mmol/L (ref 22–32)
Calcium: 9.1 mg/dL (ref 8.9–10.3)
Chloride: 104 mmol/L (ref 98–111)
Creatinine, Ser: 1.08 mg/dL — ABNORMAL HIGH (ref 0.44–1.00)
GFR, Estimated: >60 mL/min (ref >60)
Glucose, Bld: 94 mg/dL (ref 70–99)
Potassium: 4.3 mmol/L (ref 3.5–5.1)
Sodium: 135 mmol/L (ref 135–145)

## 2024-01-02 LAB — BRAIN NATRIURETIC PEPTIDE: B Natriuretic Peptide: 19 pg/mL (ref 0.0–100.0)

## 2024-01-02 MED ORDER — ISOSORBIDE MONONITRATE ER 30 MG PO TB24
30.0000 mg | ORAL_TABLET | Freq: Every day | ORAL | 3 refills | Status: DC
  Filled 2024-01-02: qty 30, 30d supply, fill #0
  Filled 2024-02-10: qty 30, 30d supply, fill #1

## 2024-01-02 NOTE — Patient Instructions (Signed)
 Medication Changes:  START ISOSORBIDE 30MG  ONCE DAILY   Lab Work:  Labs done today, your results will be available in MyChart, we will contact you for abnormal readings.  Testing/Procedures:  ECHOCARDIOGRAM IN 2 MONTHS AS SCHEDULED   Referrals:  YOU HAVE BEEN REFERRED TO HYPERTENSION CLINIC (DR. [place]) THEY WILL REACH OUT TO YOU OR CALL TO ARRANGE THIS. PLEASE CALL us WITH ANY CONCERNS   Follow-Up in: 2 MONTHS AS SCHEDULED   At the [facility], you and your health needs are our priority. We have a designated team specialized in the treatment of Heart Failure. This Care Team includes your primary Heart Failure Specialized Cardiologist (physician), Advanced Practice Providers (APPs- Physician Assistants and Nurse Practitioners), and Pharmacist who all work together to provide you with the care you need, when you need it.   You may see any of the following providers on your designated Care Team at your next follow up:  Dr. Arvilla Meres Dr. Marca Ancona Dr. Dorthula Nettles Dr. Theresia Bough Tonye Becket, NP Robbie Lis, [place] [facility] [place], [place] Brynda Peon, NP [place] Lee, NP Karle Plumber, PharmD   Please be sure to bring in all your medications bottles to every appointment.   Need to Contact us:  If you have any questions or concerns before your next appointment please send [place] a message through [place] or call our office at [phone].    TO LEAVE A MESSAGE FOR THE NURSE SELECT OPTION 2, PLEASE LEAVE A MESSAGE INCLUDING: YOUR NAME DATE OF BIRTH CALL BACK NUMBER REASON FOR CALL**this is important as we prioritize the call backs  YOU WILL RECEIVE A CALL BACK THE SAME DAY AS LONG AS YOU CALL BEFORE 4:00 PM

## 2024-01-02 NOTE — Progress Notes (Signed)
 ADVANCED HF CLINIC NOTE  PCP: None  HF Cardiologist: Dr Gala Romney   HPI Mandy Serrano is a 48 y.o. with history of obesity, HTN  was on 5 anti-HTN medicines in [place]), sickle cell anemia, systolic heart failure 05/2016, headaches, and frequent PVCs.  Admitted 8/17 with increased dyspnea and HTN crisis. She had not been taking any medications over the last last year. New acute systolic heart identified on ECHO. LVEF ~25%. CMRI no infiltrative/inflamatory process and EF ~25%. Had high PVC burden (16%) so she was started on amio. Amio eventually switch to flecainide as EF recovered.   Unable to obtain Sherryll Burger because she is not [place] citizen.    Echo 7/19 EF 60-65%.  Bedside echo 04/2020 EF 60-65%.  Graduated from AHF clinic 05/2022, and referred to HTN clinic.  Follow up 2/25, off most meds and BP uncontrolled. Restart GDMT with plans to repeat echo to ensure EF stable.  Today she returns for HF follow up. Overall feeling fine. No SOB with activity. Denies palpitations, abnormal bleeding, CP, dizziness, edema, or PND/Orthopnea. Appetite ok. No fever or chills. Weight at home 226 pounds. Taking all medications. BP 139/90 at home. No ETOH/tobacco/drugs at home. She is uninsured, non-citizen.  Cardiac Studies - Zio 3/23: 6% PVC burden - Bedside echo 7/21: EF 60-65% - Echo 7/19: EF 60-65% - Echo 12/17: EF 55% - Echo 8/17: EF ~25-30%.  - cMRI 8/17: LVEF 25%, no infiltrative/inflamatory process    Review of systems complete and found to be negative unless listed in HPI.   SH:  Social History   Socioeconomic History   Marital status: Married    Spouse name: Not on file   Number of children: Not on file   Years of education: Not on file   Highest education level: Not on file  Occupational History   Not on file  Tobacco Use   Smoking status: Never    Passive exposure: Past   Smokeless tobacco: Never  Substance and Sexual Activity   Alcohol use: No   Drug use: No   Sexual  activity: Not on file  Other Topics Concern   Not on file  Social History Narrative   Not on file   Social Drivers of Health   Financial Resource Strain: Medium Risk (12/17/2023)   Overall Financial Resource Strain (CARDIA)    Difficulty of Paying Living Expenses: Somewhat hard  Food Insecurity: Not on file  Transportation Needs: Not on file  Physical Activity: Not on file  Stress: Not on file  Social Connections: Not on file  Intimate Partner Violence: Not on file    FH:  Family History  Problem Relation Age of Onset   Hypertension Mother    Hypertension Father     Past Medical History:  Diagnosis Date   Malignant hypertension     Current Outpatient Medications  Medication Sig Dispense Refill   acetaminophen (TYLENOL) 500 MG tablet Take 500 mg by mouth every 6 (six) hours as needed for mild pain or headache.     amLODipine (NORVASC) 10 MG tablet Take 1 tablet (10 mg total) by mouth daily. 30 tablet 8   ASPIR-LOW 81 MG EC tablet TAKE 1 TABLET BY MOUTH DAILY. 30 tablet 3   Blood Pressure Monitoring (BLOOD PRESSURE CUFF) MISC Please check and monitor blood pressure as advised in the clinic 1 each 0   carvedilol (COREG) 12.5 MG tablet Take 1 tablet (12.5 mg total) by mouth 2 (two) times daily with a meal. 60  tablet 8   flecainide (TAMBOCOR) 100 MG tablet Take 1 tablet (100 mg total) by mouth 2 (two) times daily. *need appt* 60 tablet 8   hydrALAZINE (APRESOLINE) 100 MG tablet Take 1 tablet (100 mg total) by mouth 3 (three) times daily. 90 tablet 8   losartan (COZAAR) 100 MG tablet Take 1 tablet (100 mg total) by mouth in the morning AND 1/2 tablet (50mg ) every evening. Pt needs to schedule appt with Dr. Elberta Fortis for further refills - 3rd attempt. 45 tablet 8   spironolactone (ALDACTONE) 25 MG tablet Take 1 tablet (25 mg total) by mouth daily. 30 tablet 8   No current facility-administered medications for this encounter.   Wt Readings from Last 3 Encounters:  01/02/24 102 kg  (224 lb 12.8 oz)  12/25/23 102.1 kg (225 lb)  12/18/23 102.1 kg (225 lb)   BP (!) 150/94   Pulse 79   Wt 102 kg (224 lb 12.8 oz)   SpO2 98%   BMI 37.41 kg/m   PHYSICAL EXAM: General:  NAD. No resp difficulty HEENT: Normal Neck: Supple. No JVD. Cor: Regular rate & rhythm. No rubs, gallops or murmurs. Lungs: Clear Abdomen: Soft, nontender, nondistended.  Extremities: No cyanosis, clubbing, rash, edema Neuro: Alert & oriented x 3, moves all 4 extremities w/o difficulty. Affect pleasant.  ASSESSMENT & PLAN:  1. 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  - Echo 8/17: EF ~25-30%.  - cMRI no infiltrative/inflamatory process and EF ~25%.   - Echo 12/17: EF recovery, EF up to 55%.  - Echo 7/19: EF 60-65% - Bedside echo 05/10/20: EF 60-65%  - NYHA I-II, volume looks OK today, weight stable. - Start Imdur 30 mg daily. - Continue hydralazine 100 mg tid - Continue Coreg 12.5 mg bid - Continue spiro 50 mg daily - Continue losartan 100 mg am/50 mg pm - Labs today - Repeat echo to ensure EF stable, then can likely graduate AHF clinic.  2. HTN - BP better, but not at goal - Adding Imdur as above, likely stop amlodipine at next visit. - Continue to check BP at home. - realizes need for weight loss and more activity - with refractory HTN, she needs sleep study but cannot afford without insurance - Refer to HTN clinic.  3. PVCs - While in hospital had 16% PVCs in 8/17. Amio added. PVCs suppressed with EF recovery. Off amio and PVCs back.  - Holter monitor 01/2018 with rare PVCs on flecainide (0.49%) - Started on flecanide per EP. She declined ablation.  - Continue flecainide 100 mg bid.  - Follows with Dr Elberta Fortis. - As above, needs sleep study  4. SDOH - HF meds thru HF fund  - Given list of PCPs to establish care - She is uninsured, non-citizen (she is a [place] resident) - HFSW to help with resources.  Follow up in 2-3 months with Dr. Gala Romney +  echo. If EF stable, consider graduation from AHF clinic  Mandy Ganong, FNP 3:37 PM

## 2024-01-07 ENCOUNTER — Ambulatory Visit: Payer: Self-pay | Attending: Pulmonary Disease | Admitting: Pulmonary Disease

## 2024-01-07 ENCOUNTER — Other Ambulatory Visit (HOSPITAL_COMMUNITY): Payer: Self-pay

## 2024-01-07 ENCOUNTER — Encounter: Payer: Self-pay | Admitting: Pulmonary Disease

## 2024-01-07 VITALS — BP 139/80 | HR 70 | Ht 65.0 in | Wt 224.0 lb

## 2024-01-07 DIAGNOSIS — I493 Ventricular premature depolarization: Secondary | ICD-10-CM

## 2024-01-07 DIAGNOSIS — I5021 Acute systolic (congestive) heart failure: Secondary | ICD-10-CM

## 2024-01-07 DIAGNOSIS — I10 Essential (primary) hypertension: Secondary | ICD-10-CM

## 2024-01-07 DIAGNOSIS — Z79899 Other long term (current) drug therapy: Secondary | ICD-10-CM

## 2024-01-07 DIAGNOSIS — I5022 Chronic systolic (congestive) heart failure: Secondary | ICD-10-CM

## 2024-01-07 NOTE — Patient Instructions (Signed)
 Medication Instructions:  Your physician recommends that you continue on your current medications as directed. Please refer to the Current Medication list given to you today.  *If you need a refill on your cardiac medications before your next appointment, please call your pharmacy*  Lab Work: None ordered If you have labs (blood work) drawn today and your tests are completely normal, you will receive your results only by: MyChart Message (if you have MyChart) OR A paper copy in the mail If you have any lab test that is abnormal or we need to change your treatment, we will call you to review the results.  Follow-Up: At [facility], you and your health needs are our priority.  As part of our continuing mission to provide you with exceptional heart care, we have created designated Provider Care Teams.  These Care Teams include your primary Cardiologist (physician) and Advanced Practice Providers (APPs -  Physician Assistants and Nurse Practitioners) who all work together to provide you with the care you need, when you need it.  Your next appointment:   4 month(s)  Provider:   Canary Brim, NP

## 2024-01-12 ENCOUNTER — Other Ambulatory Visit (HOSPITAL_COMMUNITY): Payer: Self-pay

## 2024-01-15 ENCOUNTER — Other Ambulatory Visit (HOSPITAL_COMMUNITY): Payer: Self-pay

## 2024-02-11 ENCOUNTER — Other Ambulatory Visit (HOSPITAL_COMMUNITY): Payer: Self-pay

## 2024-02-12 ENCOUNTER — Other Ambulatory Visit (HOSPITAL_COMMUNITY): Payer: Self-pay

## 2024-03-02 ENCOUNTER — Emergency Department (HOSPITAL_COMMUNITY): Payer: MEDICAID

## 2024-03-02 ENCOUNTER — Inpatient Hospital Stay (HOSPITAL_COMMUNITY): Payer: MEDICAID

## 2024-03-02 ENCOUNTER — Inpatient Hospital Stay (HOSPITAL_COMMUNITY)
Admission: EM | Admit: 2024-03-02 | Discharge: 2024-03-12 | DRG: 308 | Disposition: A | Discharge status: Home / Self Care | Payer: MEDICAID | Attending: Internal Medicine | Admitting: Internal Medicine

## 2024-03-02 DIAGNOSIS — I4901 Ventricular fibrillation: Principal | ICD-10-CM | POA: Diagnosis present

## 2024-03-02 DIAGNOSIS — Z7982 Long term (current) use of aspirin: Secondary | ICD-10-CM

## 2024-03-02 DIAGNOSIS — N179 Acute kidney failure, unspecified: Secondary | ICD-10-CM | POA: Diagnosis present

## 2024-03-02 DIAGNOSIS — E8721 Acute metabolic acidosis: Secondary | ICD-10-CM

## 2024-03-02 DIAGNOSIS — Z79899 Other long term (current) drug therapy: Secondary | ICD-10-CM

## 2024-03-02 DIAGNOSIS — E66812 Obesity, class 2: Secondary | ICD-10-CM | POA: Diagnosis present

## 2024-03-02 DIAGNOSIS — R748 Abnormal levels of other serum enzymes: Secondary | ICD-10-CM | POA: Diagnosis present

## 2024-03-02 DIAGNOSIS — F05 Delirium due to known physiological condition: Secondary | ICD-10-CM | POA: Diagnosis not present

## 2024-03-02 DIAGNOSIS — E874 Mixed disorder of acid-base balance: Secondary | ICD-10-CM | POA: Diagnosis present

## 2024-03-02 DIAGNOSIS — Z7722 Contact with and (suspected) exposure to environmental tobacco smoke (acute) (chronic): Secondary | ICD-10-CM | POA: Diagnosis present

## 2024-03-02 DIAGNOSIS — R739 Hyperglycemia, unspecified: Secondary | ICD-10-CM | POA: Diagnosis present

## 2024-03-02 DIAGNOSIS — I5042 Chronic combined systolic (congestive) and diastolic (congestive) heart failure: Secondary | ICD-10-CM | POA: Diagnosis present

## 2024-03-02 DIAGNOSIS — R7401 Elevation of levels of liver transaminase levels: Secondary | ICD-10-CM

## 2024-03-02 DIAGNOSIS — Z6838 Body mass index (BMI) 38.0-38.9, adult: Secondary | ICD-10-CM

## 2024-03-02 DIAGNOSIS — I493 Ventricular premature depolarization: Secondary | ICD-10-CM | POA: Diagnosis present

## 2024-03-02 DIAGNOSIS — I428 Other cardiomyopathies: Secondary | ICD-10-CM | POA: Diagnosis present

## 2024-03-02 DIAGNOSIS — D72829 Elevated white blood cell count, unspecified: Secondary | ICD-10-CM | POA: Diagnosis present

## 2024-03-02 DIAGNOSIS — R7989 Other specified abnormal findings of blood chemistry: Secondary | ICD-10-CM

## 2024-03-02 DIAGNOSIS — E875 Hyperkalemia: Secondary | ICD-10-CM | POA: Diagnosis present

## 2024-03-02 DIAGNOSIS — Z91148 Patient's other noncompliance with medication regimen for other reason: Secondary | ICD-10-CM

## 2024-03-02 DIAGNOSIS — I469 Cardiac arrest, cause unspecified: Principal | ICD-10-CM | POA: Diagnosis present

## 2024-03-02 DIAGNOSIS — Z8249 Family history of ischemic heart disease and other diseases of the circulatory system: Secondary | ICD-10-CM

## 2024-03-02 DIAGNOSIS — E785 Hyperlipidemia, unspecified: Secondary | ICD-10-CM | POA: Diagnosis present

## 2024-03-02 DIAGNOSIS — I472 Ventricular tachycardia, unspecified: Secondary | ICD-10-CM | POA: Diagnosis present

## 2024-03-02 DIAGNOSIS — I462 Cardiac arrest due to underlying cardiac condition: Secondary | ICD-10-CM | POA: Diagnosis present

## 2024-03-02 DIAGNOSIS — W19XXXA Unspecified fall, initial encounter: Secondary | ICD-10-CM | POA: Diagnosis present

## 2024-03-02 DIAGNOSIS — I444 Left anterior fascicular block: Secondary | ICD-10-CM | POA: Diagnosis present

## 2024-03-02 DIAGNOSIS — R570 Cardiogenic shock: Secondary | ICD-10-CM | POA: Diagnosis present

## 2024-03-02 DIAGNOSIS — I4510 Unspecified right bundle-branch block: Secondary | ICD-10-CM | POA: Diagnosis present

## 2024-03-02 DIAGNOSIS — J9602 Acute respiratory failure with hypercapnia: Secondary | ICD-10-CM | POA: Diagnosis present

## 2024-03-02 DIAGNOSIS — E876 Hypokalemia: Secondary | ICD-10-CM | POA: Diagnosis present

## 2024-03-02 DIAGNOSIS — R579 Shock, unspecified: Secondary | ICD-10-CM

## 2024-03-02 DIAGNOSIS — R4189 Other symptoms and signs involving cognitive functions and awareness: Secondary | ICD-10-CM | POA: Diagnosis present

## 2024-03-02 DIAGNOSIS — J9601 Acute respiratory failure with hypoxia: Secondary | ICD-10-CM | POA: Diagnosis present

## 2024-03-02 DIAGNOSIS — G9340 Encephalopathy, unspecified: Secondary | ICD-10-CM | POA: Diagnosis present

## 2024-03-02 DIAGNOSIS — I110 Hypertensive heart disease with heart failure: Secondary | ICD-10-CM | POA: Diagnosis present

## 2024-03-02 DIAGNOSIS — D571 Sickle-cell disease without crisis: Secondary | ICD-10-CM | POA: Diagnosis present

## 2024-03-02 DIAGNOSIS — K7200 Acute and subacute hepatic failure without coma: Secondary | ICD-10-CM | POA: Diagnosis present

## 2024-03-02 LAB — CBC
HCT: 39 % (ref 36.0–46.0)
Hemoglobin: 12.3 g/dL (ref 12.0–15.0)
MCH: 30.3 pg (ref 26.0–34.0)
MCHC: 31.5 g/dL (ref 30.0–36.0)
MCV: 96.1 fL (ref 80.0–100.0)
Platelets: 320 10*3/uL (ref 150–400)
RBC: 4.06 MIL/uL (ref 3.87–5.11)
RDW: 14.3 % (ref 11.5–15.5)
WBC: 23.8 10*3/uL — ABNORMAL HIGH (ref 4.0–10.5)
nRBC: 0.1 % (ref 0.0–0.2)

## 2024-03-02 LAB — I-STAT ARTERIAL BLOOD GAS, ED
Acid-base deficit: 15 mmol/L — ABNORMAL HIGH (ref 0.0–2.0)
Bicarbonate: 18.1 mmol/L — ABNORMAL LOW (ref 20.0–28.0)
Calcium, Ion: 1.35 mmol/L (ref 1.15–1.40)
HCT: 39 % (ref 36.0–46.0)
Hemoglobin: 13.3 g/dL (ref 12.0–15.0)
O2 Saturation: 88 %
Patient temperature: 99.4
Potassium: 6.3 mmol/L (ref 3.5–5.1)
Sodium: 131 mmol/L — ABNORMAL LOW (ref 135–145)
TCO2: 20 mmol/L — ABNORMAL LOW (ref 22–32)
pCO2 arterial: 77.4 mmHg (ref 32–48)
pH, Arterial: 6.979 — CL (ref 7.35–7.45)
pO2, Arterial: 89 mmHg (ref 83–108)

## 2024-03-02 LAB — BASIC METABOLIC PANEL WITH GFR
Anion gap: 12 (ref 5–15)
Anion gap: 15 (ref 5–15)
Anion gap: 11 (ref 5–15)
BUN: 34 mg/dL — ABNORMAL HIGH (ref 6–20)
BUN: 40 mg/dL — ABNORMAL HIGH (ref 6–20)
BUN: 41 mg/dL — ABNORMAL HIGH (ref 6–20)
CO2: 20 mmol/L — ABNORMAL LOW (ref 22–32)
CO2: 17 mmol/L — ABNORMAL LOW (ref 22–32)
CO2: 22 mmol/L (ref 22–32)
Calcium: 8.7 mg/dL — ABNORMAL LOW (ref 8.9–10.3)
Calcium: 8.6 mg/dL — ABNORMAL LOW (ref 8.9–10.3)
Calcium: 8.3 mg/dL — ABNORMAL LOW (ref 8.9–10.3)
Chloride: 105 mmol/L (ref 98–111)
Chloride: 103 mmol/L (ref 98–111)
Chloride: 105 mmol/L (ref 98–111)
Creatinine, Ser: 1.9 mg/dL — ABNORMAL HIGH (ref 0.44–1.00)
Creatinine, Ser: 2.26 mg/dL — ABNORMAL HIGH (ref 0.44–1.00)
Creatinine, Ser: 2.56 mg/dL — ABNORMAL HIGH (ref 0.44–1.00)
GFR, Estimated: 32 mL/min — ABNORMAL LOW (ref >60)
GFR, Estimated: 26 mL/min — ABNORMAL LOW (ref >60)
GFR, Estimated: 22 mL/min — ABNORMAL LOW (ref >60)
Glucose, Bld: 138 mg/dL — ABNORMAL HIGH (ref 70–99)
Glucose, Bld: 188 mg/dL — ABNORMAL HIGH (ref 70–99)
Glucose, Bld: 158 mg/dL — ABNORMAL HIGH (ref 70–99)
Potassium: 5.4 mmol/L — ABNORMAL HIGH (ref 3.5–5.1)
Potassium: 4.6 mmol/L (ref 3.5–5.1)
Potassium: 4.1 mmol/L (ref 3.5–5.1)
Sodium: 137 mmol/L (ref 135–145)
Sodium: 135 mmol/L (ref 135–145)
Sodium: 138 mmol/L (ref 135–145)

## 2024-03-02 LAB — COMPREHENSIVE METABOLIC PANEL WITH GFR
ALT: 209 U/L — ABNORMAL HIGH (ref 0–44)
AST: 252 U/L — ABNORMAL HIGH (ref 15–41)
Albumin: 3 g/dL — ABNORMAL LOW (ref 3.5–5.0)
Alkaline Phosphatase: 66 U/L (ref 38–126)
Anion gap: 17 — ABNORMAL HIGH (ref 5–15)
BUN: 26 mg/dL — ABNORMAL HIGH (ref 6–20)
CO2: 16 mmol/L — ABNORMAL LOW (ref 22–32)
Calcium: 9.3 mg/dL (ref 8.9–10.3)
Chloride: 102 mmol/L (ref 98–111)
Creatinine, Ser: 1.57 mg/dL — ABNORMAL HIGH (ref 0.44–1.00)
GFR, Estimated: 40 mL/min — ABNORMAL LOW (ref >60)
Glucose, Bld: 324 mg/dL — ABNORMAL HIGH (ref 70–99)
Potassium: 3.9 mmol/L (ref 3.5–5.1)
Sodium: 135 mmol/L (ref 135–145)
Total Bilirubin: 0.6 mg/dL (ref 0.0–1.2)
Total Protein: 6.5 g/dL (ref 6.5–8.1)

## 2024-03-02 LAB — POCT I-STAT 7, (LYTES, BLD GAS, ICA,H+H)
Acid-base deficit: 7 mmol/L — ABNORMAL HIGH (ref 0.0–2.0)
Bicarbonate: 20.3 mmol/L (ref 20.0–28.0)
Calcium, Ion: 1.24 mmol/L (ref 1.15–1.40)
HCT: 37 % (ref 36.0–46.0)
Hemoglobin: 12.6 g/dL (ref 12.0–15.0)
O2 Saturation: 99 %
Patient temperature: 36.9
Potassium: 4.9 mmol/L (ref 3.5–5.1)
Sodium: 135 mmol/L (ref 135–145)
TCO2: 22 mmol/L (ref 22–32)
pCO2 arterial: 47 mmHg (ref 32–48)
pH, Arterial: 7.242 — ABNORMAL LOW (ref 7.35–7.45)
pO2, Arterial: 159 mmHg — ABNORMAL HIGH (ref 83–108)

## 2024-03-02 LAB — CBC WITH DIFFERENTIAL/PLATELET
Abs Immature Granulocytes: 0.8 10*3/uL — ABNORMAL HIGH (ref 0.00–0.07)
Basophils Absolute: 0 10*3/uL (ref 0.0–0.1)
Basophils Relative: 0 %
Eosinophils Absolute: 1.4 10*3/uL — ABNORMAL HIGH (ref 0.0–0.5)
Eosinophils Relative: 5 %
HCT: 40.1 % (ref 36.0–46.0)
Hemoglobin: 12.1 g/dL (ref 12.0–15.0)
Lymphocytes Relative: 31 %
Lymphs Abs: 8.5 10*3/uL — ABNORMAL HIGH (ref 0.7–4.0)
MCH: 30.3 pg (ref 26.0–34.0)
MCHC: 30.2 g/dL (ref 30.0–36.0)
MCV: 100.5 fL — ABNORMAL HIGH (ref 80.0–100.0)
Monocytes Absolute: 1.1 10*3/uL — ABNORMAL HIGH (ref 0.1–1.0)
Monocytes Relative: 4 %
Myelocytes: 3 %
Neutro Abs: 15.7 10*3/uL — ABNORMAL HIGH (ref 1.7–7.7)
Neutrophils Relative %: 57 %
Platelets: 281 10*3/uL (ref 150–400)
RBC: 3.99 MIL/uL (ref 3.87–5.11)
RDW: 14.2 % (ref 11.5–15.5)
WBC: 27.5 10*3/uL — ABNORMAL HIGH (ref 4.0–10.5)
nRBC: 0 /100{WBCs}
nRBC: 0.1 % (ref 0.0–0.2)

## 2024-03-02 LAB — COOXEMETRY PANEL
Carboxyhemoglobin: 1 % (ref 0.5–1.5)
Methemoglobin: <0.7 % (ref 0.0–1.5)
O2 Saturation: 72.5 %
Total hemoglobin: 12.6 g/dL (ref 12.0–16.0)

## 2024-03-02 LAB — GLUCOSE, CAPILLARY
Glucose-Capillary: 132 mg/dL — ABNORMAL HIGH (ref 70–99)
Glucose-Capillary: 140 mg/dL — ABNORMAL HIGH (ref 70–99)
Glucose-Capillary: 149 mg/dL — ABNORMAL HIGH (ref 70–99)
Glucose-Capillary: 156 mg/dL — ABNORMAL HIGH (ref 70–99)
Glucose-Capillary: 165 mg/dL — ABNORMAL HIGH (ref 70–99)

## 2024-03-02 LAB — TROPONIN I (HIGH SENSITIVITY)
Troponin I (High Sensitivity): 141 ng/L (ref <18)
Troponin I (High Sensitivity): 706 ng/L (ref <18)

## 2024-03-02 LAB — MAGNESIUM: Magnesium: 2 mg/dL (ref 1.7–2.4)

## 2024-03-02 LAB — URINALYSIS, ROUTINE W REFLEX MICROSCOPIC
Bilirubin Urine: NEGATIVE
Glucose, UA: 50 mg/dL — AB
Ketones, ur: NEGATIVE mg/dL
Leukocytes,Ua: NEGATIVE
Nitrite: NEGATIVE
Protein, ur: 100 mg/dL — AB
Specific Gravity, Urine: 1.012 (ref 1.005–1.030)
WBC, UA: >50 WBC/hpf (ref 0–5)
pH: 5 (ref 5.0–8.0)

## 2024-03-02 LAB — BRAIN NATRIURETIC PEPTIDE: B Natriuretic Peptide: 137.1 pg/mL — ABNORMAL HIGH (ref 0.0–100.0)

## 2024-03-02 LAB — CREATININE, SERUM
Creatinine, Ser: 1.91 mg/dL — ABNORMAL HIGH (ref 0.44–1.00)
GFR, Estimated: 32 mL/min — ABNORMAL LOW (ref >60)

## 2024-03-02 LAB — CBG MONITORING, ED
Glucose-Capillary: 117 mg/dL — ABNORMAL HIGH (ref 70–99)
Glucose-Capillary: 295 mg/dL — ABNORMAL HIGH (ref 70–99)

## 2024-03-02 LAB — LACTIC ACID, PLASMA: Lactic Acid, Venous: 1.3 mmol/L (ref 0.5–1.9)

## 2024-03-02 LAB — MRSA NEXT GEN BY PCR, NASAL
MRSA by PCR Next Gen: NOT DETECTED
MRSA by PCR Next Gen: NOT DETECTED

## 2024-03-02 LAB — PHOSPHORUS: Phosphorus: 8.6 mg/dL — ABNORMAL HIGH (ref 2.5–4.6)

## 2024-03-02 LAB — I-STAT CG4 LACTIC ACID, ED: Lactic Acid, Venous: 8.9 mmol/L (ref 0.5–1.9)

## 2024-03-02 LAB — HIV ANTIBODY (ROUTINE TESTING W REFLEX): HIV Screen 4th Generation wRfx: NONREACTIVE

## 2024-03-02 MED ORDER — NOREPINEPHRINE 4 MG/250ML-% IV SOLN
2.0000 ug/min | INTRAVENOUS | Status: DC
  Filled 2024-03-02: qty 250

## 2024-03-02 MED ORDER — IPRATROPIUM BROMIDE 0.02 % IN SOLN: 0.5000 mg | RESPIRATORY_TRACT | Status: DC | PRN

## 2024-03-02 MED ORDER — DOCUSATE SODIUM 50 MG/5ML PO LIQD
100.0000 mg | Freq: Two times a day (BID) | ORAL | Status: DC
  Administered 2024-03-02: 100 mg
  Filled 2024-03-02: qty 10

## 2024-03-02 MED ORDER — ONDANSETRON HCL 4 MG/2ML IJ SOLN
4.0000 mg | Freq: Four times a day (QID) | INTRAMUSCULAR | Status: DC | PRN
  Administered 2024-03-03 – 2024-03-04 (×2): 4 mg via INTRAVENOUS
  Filled 2024-03-02 (×2): qty 2

## 2024-03-02 MED ORDER — SODIUM CHLORIDE 0.9 % IV SOLN: 250.0000 mL | INTRAVENOUS | Status: AC

## 2024-03-02 MED ORDER — LACTATED RINGERS IV BOLUS
1000.0000 mL | Freq: Once | INTRAVENOUS | Status: AC
  Administered 2024-03-02: 1000 mL via INTRAVENOUS

## 2024-03-02 MED ORDER — MIDAZOLAM HCL 2 MG/2ML IJ SOLN
2.0000 mg | Freq: Once | INTRAMUSCULAR | Status: AC
  Administered 2024-03-02: 2 mg via INTRAVENOUS

## 2024-03-02 MED ORDER — LACTATED RINGERS IV BOLUS
1000.0000 mL | Freq: Once | INTRAVENOUS | Status: AC
  Administered 2024-03-02: 999 mL via INTRAVENOUS

## 2024-03-02 MED ORDER — SODIUM BICARBONATE 8.4 % IV SOLN
INTRAVENOUS | Status: AC | PRN
  Administered 2024-03-02: 50 meq via INTRAVENOUS

## 2024-03-02 MED ORDER — VITAL 1.5 CAL PO LIQD: 1000.0000 mL | ORAL | Status: DC

## 2024-03-02 MED ORDER — PROPOFOL 1000 MG/100ML IV EMUL
5.0000 ug/kg/min | INTRAVENOUS | Status: DC
  Administered 2024-03-02 (×2): 20 ug/kg/min via INTRAVENOUS
  Administered 2024-03-02: 5 ug/kg/min via INTRAVENOUS
  Administered 2024-03-03: 20 ug/kg/min via INTRAVENOUS
  Filled 2024-03-02 (×4): qty 100

## 2024-03-02 MED ORDER — FAMOTIDINE 20 MG PO TABS
20.0000 mg | ORAL_TABLET | Freq: Every day | ORAL | Status: DC
  Administered 2024-03-02: 20 mg
  Filled 2024-03-02 (×2): qty 1

## 2024-03-02 MED ORDER — FENTANYL 2500MCG IN NS 250ML (10MCG/ML) PREMIX INFUSION
0.0000 ug/h | INTRAVENOUS | Status: DC
  Administered 2024-03-02: 25 ug/h via INTRAVENOUS
  Filled 2024-03-02: qty 250

## 2024-03-02 MED ORDER — HEPARIN SODIUM (PORCINE) 5000 UNIT/ML IJ SOLN
5000.0000 [IU] | Freq: Three times a day (TID) | INTRAMUSCULAR | Status: DC
  Administered 2024-03-02 – 2024-03-12 (×31): 5000 [IU] via SUBCUTANEOUS
  Filled 2024-03-02 (×30): qty 1

## 2024-03-02 MED ORDER — SODIUM CHLORIDE 0.9 % IV SOLN: 1.0000 g | INTRAVENOUS | Status: DC

## 2024-03-02 MED ORDER — PROSOURCE TF20 ENFIT COMPATIBL EN LIQD
60.0000 mL | Freq: Two times a day (BID) | ENTERAL | Status: DC
  Administered 2024-03-02: 60 mL
  Filled 2024-03-02: qty 60

## 2024-03-02 MED ORDER — EPINEPHRINE 1 MG/10ML IJ SOSY
PREFILLED_SYRINGE | INTRAMUSCULAR | Status: AC | PRN
  Administered 2024-03-02: 1 mg via INTRAVENOUS

## 2024-03-02 MED ORDER — AMIODARONE LOAD VIA INFUSION
150.0000 mg | Freq: Once | INTRAVENOUS | Status: AC
  Administered 2024-03-02: 150 mg via INTRAVENOUS
  Filled 2024-03-02: qty 83.34

## 2024-03-02 MED ORDER — AMIODARONE HCL IN DEXTROSE 360-4.14 MG/200ML-% IV SOLN
60.0000 mg/h | INTRAVENOUS | Status: AC
  Administered 2024-03-02 (×2): 60 mg/h via INTRAVENOUS
  Filled 2024-03-02: qty 200

## 2024-03-02 MED ORDER — POLYETHYLENE GLYCOL 3350 17 G PO PACK: 17.0000 g | PACK | Freq: Every day | ORAL | Status: DC

## 2024-03-02 MED ORDER — FAMOTIDINE IN NACL 20-0.9 MG/50ML-% IV SOLN: 20.0000 mg | Freq: Two times a day (BID) | INTRAVENOUS | Status: DC

## 2024-03-02 MED ORDER — VASOPRESSIN 20 UNITS/100 ML INFUSION FOR SHOCK
0.0000 [IU]/min | INTRAVENOUS | Status: DC
  Administered 2024-03-02: 0.03 [IU]/min via INTRAVENOUS
  Filled 2024-03-02: qty 100

## 2024-03-02 MED ORDER — SODIUM BICARBONATE 8.4 % IV SOLN
INTRAVENOUS | Status: DC
  Filled 2024-03-02: qty 1000

## 2024-03-02 MED ORDER — NOREPINEPHRINE 16 MG/250ML-% IV SOLN
0.0000 ug/min | INTRAVENOUS | Status: DC
  Administered 2024-03-02: 15 ug/min via INTRAVENOUS
  Filled 2024-03-02: qty 250

## 2024-03-02 MED ORDER — SODIUM BICARBONATE 8.4 % IV SOLN
50.0000 meq | Freq: Once | INTRAVENOUS | Status: AC
  Administered 2024-03-02: 50 meq via INTRAVENOUS
  Filled 2024-03-02: qty 50

## 2024-03-02 MED ORDER — PROSOURCE TF20 ENFIT COMPATIBL EN LIQD
60.0000 mL | Freq: Every day | ENTERAL | Status: DC
  Administered 2024-03-02: 60 mL
  Filled 2024-03-02: qty 60

## 2024-03-02 MED ORDER — AMIODARONE HCL IN DEXTROSE 360-4.14 MG/200ML-% IV SOLN
30.0000 mg/h | INTRAVENOUS | Status: DC
  Administered 2024-03-02 – 2024-03-06 (×9): 30 mg/h via INTRAVENOUS
  Filled 2024-03-02 (×8): qty 200

## 2024-03-02 MED ORDER — NOREPINEPHRINE 4 MG/250ML-% IV SOLN
0.0000 ug/min | INTRAVENOUS | Status: DC
  Administered 2024-03-02: 2 ug/min via INTRAVENOUS

## 2024-03-02 MED ORDER — POLYETHYLENE GLYCOL 3350 17 G PO PACK: 17.0000 g | PACK | Freq: Every day | ORAL | Status: DC | PRN

## 2024-03-02 MED ORDER — FENTANYL BOLUS VIA INFUSION
50.0000 ug | INTRAVENOUS | Status: DC | PRN
  Administered 2024-03-02: 50 ug via INTRAVENOUS
  Administered 2024-03-02: 100 ug via INTRAVENOUS

## 2024-03-02 MED ORDER — EPINEPHRINE HCL 5 MG/250ML IV SOLN IN NS
0.5000 ug/min | INTRAVENOUS | Status: DC
  Administered 2024-03-02: 5 ug/min via INTRAVENOUS

## 2024-03-02 MED ORDER — CHLORHEXIDINE GLUCONATE CLOTH 2 % EX PADS
6.0000 | MEDICATED_PAD | Freq: Every day | CUTANEOUS | Status: DC
  Administered 2024-03-02 – 2024-03-04 (×4): 6 via TOPICAL

## 2024-03-02 MED ORDER — FENTANYL CITRATE PF 50 MCG/ML IJ SOSY
50.0000 ug | PREFILLED_SYRINGE | Freq: Once | INTRAMUSCULAR | Status: AC
  Administered 2024-03-02: 50 ug via INTRAVENOUS

## 2024-03-02 MED ORDER — MIDAZOLAM HCL 2 MG/2ML IJ SOLN: 5.0000 mg | Freq: Once | INTRAMUSCULAR | Status: DC

## 2024-03-02 MED ORDER — VITAL HIGH PROTEIN PO LIQD
1000.0000 mL | ORAL | Status: DC
  Administered 2024-03-02: 1000 mL

## 2024-03-02 MED ORDER — INSULIN ASPART 100 UNIT/ML IJ SOLN
0.0000 [IU] | INTRAMUSCULAR | Status: DC
  Administered 2024-03-02: 2 [IU] via SUBCUTANEOUS
  Administered 2024-03-02: 3 [IU] via SUBCUTANEOUS
  Administered 2024-03-02: 2 [IU] via SUBCUTANEOUS
  Administered 2024-03-02: 5 [IU] via SUBCUTANEOUS
  Administered 2024-03-02 – 2024-03-03 (×4): 2 [IU] via SUBCUTANEOUS

## 2024-03-02 MED ORDER — DOCUSATE SODIUM 100 MG PO CAPS: 100.0000 mg | ORAL_CAPSULE | Freq: Two times a day (BID) | ORAL | Status: DC | PRN

## 2024-03-02 MED ORDER — MIDAZOLAM-SODIUM CHLORIDE 100-0.9 MG/100ML-% IV SOLN
0.5000 mg/h | INTRAVENOUS | Status: DC
  Administered 2024-03-02: 0.5 mg/h via INTRAVENOUS
  Filled 2024-03-02: qty 100

## 2024-03-02 MED ORDER — SODIUM CHLORIDE 0.9 % IV SOLN
3.0000 g | Freq: Four times a day (QID) | INTRAVENOUS | Status: DC
  Administered 2024-03-02 – 2024-03-03 (×6): 3 g via INTRAVENOUS
  Filled 2024-03-02 (×6): qty 8

## 2024-03-02 MED ORDER — ORAL CARE MOUTH RINSE
15.0000 mL | OROMUCOSAL | Status: DC
  Administered 2024-03-02 – 2024-03-03 (×17): 15 mL via OROMUCOSAL

## 2024-03-02 MED ORDER — ORAL CARE MOUTH RINSE: 15.0000 mL | OROMUCOSAL | Status: DC | PRN

## 2024-03-02 MED ORDER — FENTANYL 2500MCG IN NS 250ML (10MCG/ML) PREMIX INFUSION
50.0000 ug/h | INTRAVENOUS | Status: DC
  Administered 2024-03-02: 50 ug/h via INTRAVENOUS
  Filled 2024-03-02: qty 250

## 2024-03-02 MED ORDER — CALCIUM CHLORIDE 10 % IV SOLN
INTRAVENOUS | Status: AC | PRN
  Administered 2024-03-02: 1 g via INTRAVENOUS

## 2024-03-02 NOTE — ED Notes (Signed)
 Verbal order from CC NP for CT Head non-con.

## 2024-03-02 NOTE — Progress Notes (Signed)
 Initial Nutrition Assessment  DOCUMENTATION CODES:   Not applicable  INTERVENTION:  Tube feeds via OG-tube: Transition to Vital 1.5 at 55 mL/hr (1320 mL per day) after current bottle of Vital HP is complete 60 mL ProSource TF20 - BID Regimen at goal provides 2140 calories, 129 gm protein, and 1008 mL free water daily. Monitor daily weights  NUTRITION DIAGNOSIS:   Inadequate oral intake related to inability to eat as evidenced by NPO status.  GOAL:   Patient will meet greater than or equal to 90% of their needs   MONITOR:   Labs, I & O's, Weight trends, TF tolerance, Vent status  REASON FOR ASSESSMENT:   Consult Enteral/tube feeding initiation and management  ASSESSMENT:   48 y.o female presented to the ED after being found unresponsive, with 12 minutes of downtime prior to CPR. PMH includes CHF, HTN, and sickle cell anemia. Pt admitted after cardiac arrest.   RD working remotely at time of assessment. Patient is currently intubated on ventilator support. Discussed with RN, RN already hung Vital HP.   MV: 10.1 L/min MAP (a-line): 109 Temp (24hrs), Avg:99.2 F (37.3 C), Min:94.5 F (34.7 C), Max:100.3 F (37.9 C)  Drips Amiodarone  Fentanyl  Norepinephrine Propofol: 12.19 ml/hr (322 kcal per day) Vasopressin  Nutrition Related Medications: Colace, Pepcid, NovoLog SSI, Miralax, IV antibiotics  Labs: Sodium 135, Potassium 4.9, BUN 24, Creatinine 1.90, Phosphorus 8.6, Magnesium  2.0  CBG: 117-295 mg/dL x 24 hrs   NUTRITION - FOCUSED PHYSICAL EXAM:  Deferred to follow-up.   Diet Order:   Diet Order             Diet NPO time specified  Diet effective now                   EDUCATION NEEDS:   Not appropriate for education at this time  Skin:  Skin Assessment: Reviewed RN Assessment  Last BM:  5/11 - Type 6  Height:  Ht Readings from Last 1 Encounters:  03/02/24 5' 4.02" (1.626 m)   Weight:  Wt Readings from Last 1 Encounters:  03/02/24 101.6  kg   Ideal Body Weight:  54.6 kg  BMI:  Body mass index is 38.43 kg/m.  Estimated Nutritional Needs:  Kcal:  2000-2200 Protein:  100-120 grams Fluid:  >/= 2 L   Doneta Furbish RD, LDN Clinical Dietitian

## 2024-03-02 NOTE — Progress Notes (Signed)
 EEG complete - results pending

## 2024-03-02 NOTE — ED Notes (Signed)
CCNP at bedside.  

## 2024-03-02 NOTE — ED Triage Notes (Addendum)
 Cardiac Arrest  Pt arrives as a witnessed cardiac arrest with a downtime of 12 min prior to initiation of CPR by PD. CPR started at 2252 by PD and ongoing on arrival. Epi x8 and 30 amiodarone  given by EMS. Shocked x 3 for x2 for V fib and x1 for VT.  Arrives assisted ventilations vis ET Tube. IO L Tibia  There was a report by family for "seizure like activity" prior to pts collapse and arrest.

## 2024-03-02 NOTE — Progress Notes (Signed)
 Pt transported from ED- CT- 14M w/o complications RT, RN and Family at bedside.

## 2024-03-02 NOTE — ED Notes (Signed)
 ROSC obtained.

## 2024-03-02 NOTE — ED Notes (Signed)
 Attempted to call report to 71M, unable to take this pt at this time, will call back.

## 2024-03-02 NOTE — Progress Notes (Signed)
 NAMEAnnazette Serrano, MRN:  [id number], DOB:  05/24/1976, LOS: 0 ADMISSION DATE:  03/02/2024, CONSULTATION DATE: 03/02/2024 REFERRING MD: , CHIEF COMPLAINT:  PEA arrest   History of Present Illness:  A 48 y.o. with history of obesity, HTN  was on 5 anti-HTN medicines in [place]), sickle cell anemia, systolic heart failure 05/2016, headaches, and frequent PVCs.   Admitted 8/17 with increased dyspnea and HTN crisis. She had not been taking any medications over the last last year. New acute systolic heart identified on ECHO. LVEF ~25%. CMRI no infiltrative/inflamatory process and EF ~25%. Had high PVC burden (16%) so she was started on amio. Amio eventually switch to flecainide  as EF recovered.    Unable to obtain Entresto  because she [place]  citizen.  Her most recent EF 60-65% and she was diagnosed with non-ischemic cardiomyopathy. Last seen in Cardiology/EP office 12/2023 for PVCs. She presented to ED after being found unresponsive and down time 12 min prior to CPR.  CPR started by PD at 2251/03/18.  She was coded for 52 min, mostly PEA with periods of V Tach and V fib.  She was shocked several times and she was given multiple rounds of Epinephrine IVP then started on Epinephrine drip in ED.  She is intubated and poorly responsive.  Family now at bedside.  Pertinent  Medical History  HTN, Non-ischemic Cardiomyopathy, CHFpEF   Significant Hospital Events: Including procedures, antibiotic start and stop dates in addition to other pertinent events   5/11: S/p PEA arrest . Intubated admit to ICU 5/11, 10 am: On Levophed 15 mcg/min, Vasopressin 0.03 unit/min, Propofol and Fentanyl for sedation. ABG 24/440/+8/85%. ABG 7.24/47/159/99%. Positive 2.7 L fluid balance in the last 24 hrs. On Amiodarone  drip. In sinus.   Interim History / Subjective:    Objective    Blood pressure 135/72, pulse 65, temperature 98.2 F (36.8 C), resp. rate (!) 23, height 5' 4.02" (1.626 m), weight 101.6 kg, SpO2 100%.     Vent Mode: PRVC FiO2 (%):  [85 %-100 %] 85 % Set Rate:  [18 bmp-24 bmp] 24 bmp Vt Set:  [440 mL] 440 mL PEEP:  [8 cmH20] 8 cmH20   Intake/Output Summary (Last 24 hours) at 03/02/2024 1029 Last data filed at 03/02/2024 0800 Gross per 24 hour  Intake 2904.05 ml  Output 30 ml  Net 2874.05 ml   Filed Weights   03/02/24 0230  Weight: 101.6 kg    Examination: General: sedated. SpO2 99%  HENT: PERL (sluggish response). No LNE or thyromegaly. No JVD Lungs: symmetrical air entry bilaterally. No crackles or wheezing Cardiovascular: NL S1/S2. No m/g/r Abdomen: no distension or tenderness Extremities: trace edema. Symmetrical  Neuro: sluggish gas, no corneal reflex, no muscular response but she is still sedated. Opens eyes but not purposefully. Not tracking. Has horizontal nystagmus right>left pupils.    Resolved Hospital Problem list     Assessment & Plan:  Cardiac arrest and cardiogenic shock, PEA and VT/VF, CPR started 12 min after cardiac arrest, ROSC 52 min, s/p defibrillation. On IV amiodarone  drip.  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 )  HTN  Sickle cell anemia    Best Practice (right click and "Reselect all SmartList Selections" daily)   Diet/type: tubefeeds DVT prophylaxis prophylactic heparin   Pressure ulcer(s): N/A GI prophylaxis: H2B Lines: Central line Foley:  Yes, and it is still needed Code Status:  full code Last date  of multidisciplinary goals of care discussion []   Labs   CBC: Recent Labs  Lab 03/02/24 0036 03/02/24 0115 03/02/24 0359 03/02/24 0610  WBC 27.5*  --  23.8*  --   NEUTROABS 15.7*  --   --   --   HGB 12.1 13.3 12.3 12.6  HCT 40.1 39.0 39.0 37.0  MCV 100.5*  --  96.1  --   PLT 281  --  320  --     Basic Metabolic Panel: Recent Labs  Lab 03/02/24 0036 03/02/24 0115 03/02/24 0359 03/02/24 0610  NA 135 131* 137 135  K 3.9 6.3* 5.4* 4.9  CL 102  --  105  --   CO2 16*  --  20*  --   GLUCOSE 324*  --  138*  --   BUN 26*  --  34*  --   CREATININE 1.57*  --  1.90*  1.91*  --   CALCIUM 9.3  --  8.7*  --   MG  --   --  2.0  --   PHOS  --   --  8.6*  --    GFR: Estimated Creatinine Clearance: 41.6 mL/min (A) (by C-G formula based on SCr of 1.9 mg/dL (H)). Recent Labs  Lab 03/02/24 0036 03/02/24 0044 03/02/24 0359  WBC 27.5*  --  23.8*  LATICACIDVEN  --  8.9*  --     Liver Function Tests: Recent Labs  Lab 03/02/24 0036  AST 252*  ALT 209*  ALKPHOS 66  BILITOT 0.6  PROT 6.5  ALBUMIN 3.0*   No results for input(s): "LIPASE", "AMYLASE" in the last 168 hours. No results for input(s): "AMMONIA" in the last 168 hours.  ABG    Component Value Date/Time   PHART 7.242 (L) 03/02/2024 0610   PCO2ART 47.0 03/02/2024 0610   PO2ART 159 (H) 03/02/2024 0610   HCO3 20.3 03/02/2024 0610   TCO2 22 03/02/2024 0610   ACIDBASEDEF 7.0 (H) 03/02/2024 0610   O2SAT 99 03/02/2024 0610     Coagulation Profile: No results for input(s): "INR", "PROTIME" in the last 168 hours.  Cardiac Enzymes: No results for input(s): "CKTOTAL", "CKMB", "CKMBINDEX", "TROPONINI" in the last 168 hours.  HbA1C: Hgb A1c MFr Bld  Date/Time Value Ref Range Status  12/17/2023 11:31 AM 5.6 4.8 - 5.6 % Final    Comment:    (NOTE) Pre diabetes:          5.7%-6.4%  Diabetes:              >6.4%  Glycemic control for   <7.0% adults with diabetes   09/19/2016 03:21 PM 5.5 <5.7 % Final    Comment:       For the purpose of screening for the presence of diabetes:   <5.7%       Consistent with the absence of diabetes 5.7-6.4 %   Consistent with increased risk for diabetes (prediabetes) >=6.5 %     Consistent with diabetes   This assay result is consistent with a decreased risk of diabetes.   Currently, no consensus  exists regarding use of hemoglobin A1c for diagnosis of diabetes in children.   According to American Diabetes Association (ADA) guidelines, hemoglobin A1c <7.0% represents optimal control in non-pregnant diabetic patients. Different metrics may apply to specific patient populations. Standards of Medical Care in Diabetes (ADA).       CBG: Recent Labs  Lab 03/02/24 0052 03/02/24 0437 03/02/24 0753  GLUCAP 295* 117* 149*    Review of Systems:   Intubated    Past Medical History:  She,  has a past medical history of Malignant hypertension.   Surgical History:   Past Surgical History:  Procedure Laterality Date   CESAREAN SECTION       Social History:   reports that she has never smoked. She has been exposed to tobacco smoke. She has never used smokeless tobacco. She reports that she does not drink alcohol and does not use drugs.   Family History:  Her family history includes Hypertension in her father and mother.   Allergies No Known Allergies   Home Medications  Prior to Admission medications   Medication Sig Start Date End Date Taking? Authorizing Provider  acetaminophen  (TYLENOL ) 500 MG tablet Take 500 mg by mouth every 6 (six) hours as needed for mild pain or headache.    [provider]  amLODipine  (NORVASC ) 10 MG tablet Take 1 tablet (10 mg total) by mouth daily. 12/17/23 03/16/24  Elmarie Hacking, FNP  ASPIR-LOW 81 MG EC tablet TAKE 1 TABLET BY MOUTH DAILY. 02/20/17   Darlis Eisenmenger, MD  Blood Pressure Monitoring (BLOOD PRESSURE CUFF) MISC Please check and monitor blood pressure as advised in the clinic 12/17/23   Elmarie Hacking, FNP  carvedilol  (COREG ) 12.5 MG tablet Take 1 tablet (12.5 mg total) by mouth 2 (two) times daily with a meal. 12/17/23   [place], Arlice Bene, FNP  flecainide  (TAMBOCOR ) 100 MG tablet Take 1 tablet (100 mg total) by mouth 2 (two) times daily. *need appt* 12/17/23   Elmarie Hacking, FNP  hydrALAZINE  (APRESOLINE ) 100 MG tablet Take 1 tablet (100 mg total) by mouth 3 (three) times daily. 12/17/23   Elmarie Hacking, FNP  isosorbide  mononitrate (IMDUR ) 30 MG 24 hr tablet Take 1 tablet (30 mg total) by mouth daily. 01/02/24   [place], Arlice Bene, FNP  losartan  (COZAAR ) 100 MG tablet Take 1 tablet (100 mg total) by mouth in the morning AND 1/2 tablet (50mg ) every evening. Pt needs to schedule appt with Dr. Lawana Pray for further refills - 3rd attempt. 12/17/23   Elmarie Hacking, FNP  spironolactone  (ALDACTONE ) 25 MG tablet Take 1 tablet (25 mg total) by mouth daily. 12/17/23   Elmarie Hacking, FNP     Critical care time: 45 min      Madelynn Schilder, MD [facility]

## 2024-03-02 NOTE — Procedures (Signed)
 Arterial Catheter Insertion Procedure Note  Mandy Serrano  [id number]  05/25/1976  Date:03/02/24  Time:4:22 AM    Provider Performing: Patt Boozer Nayelli Inglis    Procedure: Insertion of Arterial Line (52841) with US  guidance (32440)   Indication(s) Blood pressure monitoring and/or need for frequent ABGs  Consent Risks of the procedure as well as the alternatives and risks of each were explained to the patient and/or caregiver.  Consent for the procedure was obtained and is signed in the bedside chart  Anesthesia None   Time Out Verified patient identification, verified procedure, site/side was marked, verified correct patient position, special equipment/implants available, medications/allergies/relevant history reviewed, required imaging and test results available.   Sterile Technique Maximal sterile technique including full sterile barrier drape, hand hygiene, sterile gown, sterile gloves, mask, hair covering, sterile ultrasound probe cover (if used).   Procedure Description Area of catheter insertion was cleaned with chlorhexidine and draped in sterile fashion. With real-time ultrasound guidance an arterial catheter was placed into the left radial artery.  Appropriate arterial tracings confirmed on monitor.     Complications/Tolerance None; patient tolerated the procedure well.   EBL Minimal   Specimen(s) None   Patt Boozer Jolan Mealor, PA-C

## 2024-03-02 NOTE — Progress Notes (Signed)
   03/02/24 0129  Spiritual Encounters  Type of Visit Initial  Care provided to: Pt and family  Conversation partners present during encounter Nurse;Physician  Referral source Nurse (RN/NT/LPN)  Reason for visit Urgent spiritual support  OnCall Visit Yes   Chaplain responding to call from ED to support family of Pt in Trauma B.  Pt unconscious and intubated, and critically ill  Chaplain accompanied the Physician to speak with family as he explained the Pt's critical condition.  After going to the room and checking with the Rn, Chaplain escorted family to the room, provided chairs, tissue, and water to family.  Chaplain remained with family providing supportive presence. Family appears to be supporting one another well.  Chaplain is stepping away and instructed Rn and family how to reach out if further support is needed.  Chaplain services remain available by Spiritual Consult or for emergent cases, paging [phone]  Chaplain Dewitte Foreman, MDiv [email or web address] [phone]

## 2024-03-02 NOTE — ED Notes (Signed)
 Cental line supplies at bedside

## 2024-03-02 NOTE — Progress Notes (Signed)
 Rounding Note    Patient Name: Mandy Serrano Date of Encounter: 03/02/2024  [facility] Cardiologist: None   Subjective   Intubated and sedated  Inpatient Medications    Scheduled Meds:  Chlorhexidine Gluconate Cloth  6 each Topical Daily   docusate  100 mg Per Tube BID   heparin   5,000 Units Subcutaneous Q8H   insulin aspart  0-15 Units Subcutaneous Q4H   midazolam  5 mg Intravenous Once   mouth rinse  15 mL Mouth Rinse Q2H   polyethylene glycol  17 g Per Tube Daily   Continuous Infusions:  sodium chloride  Stopped (03/02/24 0449)   amiodarone  30 mg/hr (03/02/24 0800)   ampicillin-sulbactam (UNASYN) IV Stopped (03/02/24 0449)   famotidine (PEPCID) IV     fentaNYL infusion INTRAVENOUS     midazolam Stopped (03/02/24 0416)   norepinephrine (LEVOPHED) Adult infusion 18 mcg/min (03/02/24 0800)   propofol (DIPRIVAN) infusion 20 mcg/kg/min (03/02/24 0800)   vasopressin 0.03 Units/min (03/02/24 0800)   PRN Meds: fentaNYL, ondansetron  (ZOFRAN ) IV, mouth rinse, polyethylene glycol   Vital Signs    Vitals:   03/02/24 0700 03/02/24 0755 03/02/24 0800 03/02/24 0846  BP: (!) 105/56  120/70 130/86  Pulse: 63     Resp: (!) 25  (!) 21 (!) 28  Temp: 98.1 F (36.7 C)  98.1 F (36.7 C) 98.1 F (36.7 C)  TempSrc:  Bladder    SpO2: 99%   100%  Weight:      Height:    5' 4.02" (1.626 m)    Intake/Output Summary (Last 24 hours) at 03/02/2024 0906 Last data filed at 03/02/2024 0800 Gross per 24 hour  Intake 2904.05 ml  Output 30 ml  Net 2874.05 ml      03/02/2024    2:30 AM 01/07/2024    9:19 AM 01/02/2024    3:25 PM  Last 3 Weights  Weight (lbs) 223 lb 15.8 oz 224 lb 224 lb 12.8 oz  Weight (kg) 101.6 kg 101.606 kg 101.969 kg      Telemetry    NSR - Personally Reviewed  ECG    Sinus rhythm, rate 61, QTc 429, LVH, T wave inversions in leads I, aVL- Personally Reviewed  Physical Exam   GEN: Intubated, sedated Neck: CVC in LIJ Cardiac: RRR, no  murmurs Respiratory: Mechanical breath sounds GI: Soft MS: No edema Neuro:  Sedated Psych: Unable to assess  Labs    High Sensitivity Troponin:   Recent Labs  Lab 03/02/24 0036 03/02/24 0359  TROPONINIHS 141* 706*     Chemistry Recent Labs  Lab 03/02/24 0036 03/02/24 0115 03/02/24 0359 03/02/24 0610  NA 135 131* 137 135  K 3.9 6.3* 5.4* 4.9  CL 102  --  105  --   CO2 16*  --  20*  --   GLUCOSE 324*  --  138*  --   BUN 26*  --  34*  --   CREATININE 1.57*  --  1.90*  1.91*  --   CALCIUM 9.3  --  8.7*  --   MG  --   --  2.0  --   PROT 6.5  --   --   --   ALBUMIN 3.0*  --   --   --   AST 252*  --   --   --   ALT 209*  --   --   --   ALKPHOS 66  --   --   --   BILITOT  0.6  --   --   --   GFRNONAA 40*  --  32*  32*  --   ANIONGAP 17*  --  12  --     Lipids No results for input(s): "CHOL", "TRIG", "HDL", "LABVLDL", "LDLCALC", "CHOLHDL" in the last 168 hours.  Hematology Recent Labs  Lab 03/02/24 0036 03/02/24 0115 03/02/24 0359 03/02/24 0610  WBC 27.5*  --  23.8*  --   RBC 3.99  --  4.06  --   HGB 12.1 13.3 12.3 12.6  HCT 40.1 39.0 39.0 37.0  MCV 100.5*  --  96.1  --   MCH 30.3  --  30.3  --   MCHC 30.2  --  31.5  --   RDW 14.2  --  14.3  --   PLT 281  --  320  --    Thyroid  No results for input(s): "TSH", "FREET4" in the last 168 hours.  BNP Recent Labs  Lab 03/02/24 0359  BNP 137.1*    DDimer No results for input(s): "DDIMER" in the last 168 hours.   Radiology    CT Head Wo Contrast Result Date: 03/02/2024 CLINICAL DATA:  Cardiac arrest EXAM: CT HEAD WITHOUT CONTRAST TECHNIQUE: Contiguous axial images were obtained from the base of the skull through the vertex without intravenous contrast. RADIATION DOSE REDUCTION: This exam was performed according to the departmental dose-optimization program which includes automated exposure control, adjustment of the mA and/or kV according to patient size and/or use of iterative reconstruction technique.  COMPARISON:  None Available. FINDINGS: Brain: No evidence of acute infarction, hemorrhage, hydrocephalus, extra-axial collection or mass lesion/mass effect. Mild white matter low-density with patchy appearance, likely premature small vessel disease related to chart history of malignant hypertension. Vascular: No hyperdense vessel or unexpected calcification. Skull: Negative Sinuses/Orbits: Generalized mucosal thickening and congested appearance of nasal mucosa. Symmetric proptosis without retro-orbital pathology seen. IMPRESSION: No acute finding.  No visible global anoxic injury. Mild white matter disease likely related to history hypertension. Electronically Signed   By: Ronnette Coke M.D.   On: 03/02/2024 06:04   DG Chest Portable 1 View Result Date: 03/02/2024 CLINICAL DATA:  Central line placement EXAM: PORTABLE CHEST 1 VIEW COMPARISON:  Earlier today FINDINGS: Left IJ line with tip at the upper cavoatrial junction. No pneumothorax. Endotracheal tube tip between the clavicular heads and carina. Enteric tube reaches the stomach. Hazy bilateral perihilar to apical opacification. No effusion or pneumothorax seen. Marked cardiopericardial enlargement. IMPRESSION: New central line without complicating features. Stable aeration. Electronically Signed   By: Ronnette Coke M.D.   On: 03/02/2024 05:01   DG Chest Portable 1 View Result Date: 03/02/2024 CLINICAL DATA:  Intubation post arrest EXAM: PORTABLE CHEST 1 VIEW COMPARISON:  Chest x-ray 06/13/2016 FINDINGS: Endotracheal tube with tip terminating approximately 2-3 cm above the carina. Enteric tube courses below the hemidiaphragm with tip overlying the expected region the gastric lumen. Side port not visualized. Cardiac paddles overlie the chest. The heart and mediastinal contours are unchanged. Biapical interstitial airspace opacities. No pleural effusion. No pneumothorax. No acute osseous abnormality. IMPRESSION: 1. Biapical interstitial airspace  opacities. Finding could represent edema versus infection. 2.  Lines and tubes as above. Electronically Signed   By: Morgane  Naveau M.D.   On: 03/02/2024 01:04    Cardiac Studies     Patient Profile     48 y.o. female ith a hx of nonischemic cardiomyopathy, EF 25% improved to LVEF of 60 to 65% on 2021, hypertension, obesity,  sickle cell anemia, frequent PVCs on flecainide  who is being seen 03/02/2024 for the evaluation of status postcardiac arrest   Assessment & Plan    Cardiac arrest: She was found unresponsive, estimated downtime 12 minutes prior to CPR.  Initial rhythm PEA but also with periods of VT and VF, status post multiple shocks.  Received ACLS for 52 minutes before ROSC achieved.  Currently on amiodarone  drip - Continue amiodarone .  EKG today with QTc 429 - Check echocardiogram  Heart failure with improved ejection fraction: History of nonischemic cardiomyopathy, EF as low as 25% in 2017.  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  Was started on amiodarone  with subsequent recovery of EF, 60 to 65% in 2019.  Subsequently was switched to flecainide . - Follow-up echocardiogram - Holding GDMT as currently requiring pressor support.  Can add back as BP improves  Shock: s/p PEA arrest as above. Initial pH 6.9.  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  Once EF recovered was switched to flecainide .  Holter in 2019 on flecainide  showed rare PVCs. - Flecainide  held and started on amiodarone  as above  Acute respiratory failure with hypoxia: Intubated.  Management per PCCM  AKI: Creatinine 1.9, normal baseline.  Likely due to shock in setting of cardiac arrest as above   For questions or updates, please contact [facility] Please consult [email or web address] for contact info under      CRITICAL CARE TIME: I have spent a total of 33 minutes with patient reviewing hospital notes, telemetry, EKGs, labs and examining the patient  as well as establishing an assessment and plan that was discussed with the patient's husband.  > 50% of time was spent in direct patient care. The patient is critically ill with multi-organ system failure and requires high complexity decision making for assessment and support, frequent evaluation and titration of therapies, application of advanced monitoring technologies and extensive interpretation of multiple databases.     Signed, Wendie Hamburg, MD  03/02/2024, 9:06 AM

## 2024-03-02 NOTE — Code Documentation (Signed)
Bedside u/s

## 2024-03-02 NOTE — H&P (Signed)
 NAMEAnica Serrano, MRN:  [id number], DOB:  Dec 15, 1975, LOS: 0 ADMISSION DATE:  03/02/2024, CONSULTATION DATE:  03/02/2024 REFERRING MD:  Candelaria Chaco, MD, CHIEF COMPLAINT:  PEA Arrest  History of Present Illness:  48 y.o. with history of obesity, HTN  was on 5 anti-HTN medicines in [place]), sickle cell anemia, systolic heart failure 05/2016, headaches, and frequent PVCs.   Admitted 8/17 with increased dyspnea and HTN crisis. She had not been taking any medications over the last last year. New acute systolic heart identified on ECHO. LVEF ~25%. CMRI no infiltrative/inflamatory process and EF ~25%. Had high PVC burden (16%) so she was started on amio. Amio eventually switch to flecainide  as EF recovered.    Unable to obtain Entresto  because she [place] not [place]  citizen.  Her most recent EF 60-65% and she was diagnosed with non-ischemic cardiomyopathy. Last seen in Cardiology/EP office 12/2023 for PVCs. She presented to ED after being found unresponsive and down time 12 min prior to CPR.  CPR started by PD at April 01, 2251.  She was coded for 52 min, mostly PEA with periods of V Tach and V fib.  She was shocked several times and she was given multiple rounds of Epinephrine IVP then started on Epinephrine drip in ED.  She is intubated and poorly responsive.  Family now at bedside.  Pertinent  Medical History  HTN, Non-ischemic Cardiomyopathy, CHFpEF  Significant Hospital Events: Including procedures, antibiotic start and stop dates in addition to other pertinent events   5/11: S/p PEA arrest . Intubated admit to ICU  Interim History / Subjective:  N/a  Objective   Blood pressure 104/70, pulse (!) 58, temperature 100.1 F (37.8 C), resp. rate (!) 32, SpO2 95%.       No intake or output data in the 24 hours ending 03/02/24 0120 There were no vitals filed for this visit.  Examination: General: on vent almost agonal breathing HENT: Pupils fixed and dilated, ETT in place with frothy/blood secretions Lungs:  rhonchi and course b/l Cardiovascular: reg s1s2 no murmurs or gallops Abdomen: soft, nt nd bs pos, obese Extremities: mild LE edema, no cyanosis, clubbing or edema Neuro: unresponsive, pupils fixed   Resolved Hospital Problem list   N/a  Assessment & Plan:  Cardiac arrest Intubated in the field CPR started 12 min after cardiac arrest PEA arrest with periods of V Tach and Vfib, s/p multiple shocks, Epi x 8 Currently on Amiodarone  drip  Currently on Epinephrine drip to taper off to Norepinephrine drip Acute hypoxic respiratory failure Vent management VAP prevention AKI Monitor I's/O's Monitor serum Cr Address Electrolyte abnormalities Metabolic and respiratory acidosis Hold off IVF given pulmonary edema Diuresis when possible Possible Pneumonia-biapical infiltrates, edema vs infiltrates Get Blood and sputum cultures Start broad spectrum antibiotics  Best Practice (right click and "Reselect all SmartList Selections" daily)   Diet/type: NPO DVT prophylaxis prophylactic heparin   Pressure ulcer(s): N/A GI prophylaxis: H2B Lines: N/A Foley:  Yes, and it is still needed Code Status:  full code   Labs   CBC: Recent Labs  Lab 03/02/24 0036 03/02/24 0115  WBC 27.5*  --   NEUTROABS PENDING  --   HGB 12.1 13.3  HCT 40.1 39.0  MCV 100.5*  --   PLT 281  --     Basic Metabolic Panel: Recent Labs  Lab 03/02/24 0036 03/02/24 0115  NA 135 131*  K 3.9 6.3*  CL 102  --   CO2 16*  --   GLUCOSE 324*  --  BUN 26*  --   CREATININE 1.57*  --   CALCIUM 9.3  --    GFR: CrCl cannot be calculated (Unknown ideal weight.). Recent Labs  Lab 03/02/24 0036 03/02/24 0044  WBC 27.5*  --   LATICACIDVEN  --  8.9*    Liver Function Tests: Recent Labs  Lab 03/02/24 0036  AST 252*  ALT 209*  ALKPHOS 66  BILITOT 0.6  PROT 6.5  ALBUMIN 3.0*   No results for input(s): "LIPASE", "AMYLASE" in the last 168 hours. No results for input(s): "AMMONIA" in the last 168  hours.  ABG    Component Value Date/Time   PHART 6.979 (LL) 03/02/2024 0115   PCO2ART 77.4 (HH) 03/02/2024 0115   PO2ART 89 03/02/2024 0115   HCO3 18.1 (L) 03/02/2024 0115   TCO2 20 (L) 03/02/2024 0115   ACIDBASEDEF 15.0 (H) 03/02/2024 0115   O2SAT 88 03/02/2024 0115     Coagulation Profile: No results for input(s): "INR", "PROTIME" in the last 168 hours.  Cardiac Enzymes: No results for input(s): "CKTOTAL", "CKMB", "CKMBINDEX", "TROPONINI" in the last 168 hours.  HbA1C: Hgb A1c MFr Bld  Date/Time Value Ref Range Status  12/17/2023 11:31 AM 5.6 4.8 - 5.6 % Final    Comment:    (NOTE) Pre diabetes:          5.7%-6.4%  Diabetes:              >6.4%  Glycemic control for   <7.0% adults with diabetes   09/19/2016 03:21 PM 5.5 <5.7 % Final    Comment:      For the purpose of screening for the presence of diabetes:   <5.7%       Consistent with the absence of diabetes 5.7-6.4 %   Consistent with increased risk for diabetes (prediabetes) >=6.5 %     Consistent with diabetes   This assay result is consistent with a decreased risk of diabetes.   Currently, no consensus exists regarding use of hemoglobin A1c for diagnosis of diabetes in children.   According to American Diabetes Association (ADA) guidelines, hemoglobin A1c <7.0% represents optimal control in non-pregnant diabetic patients. Different metrics may apply to specific patient populations. Standards of Medical Care in Diabetes (ADA).       CBG: Recent Labs  Lab 03/02/24 0052  GLUCAP 295*    Review of Systems:   Intubated post code, not able to perform ROS  Past Medical History:  She,  has a past medical history of Malignant hypertension.   Surgical History:   Past Surgical History:  Procedure Laterality Date   CESAREAN SECTION       Social History:   reports that she has never smoked. She has been exposed to tobacco smoke. She has never used smokeless tobacco. She reports that she does not  drink alcohol and does not use drugs.   Family History:  Her family history includes Hypertension in her father and mother.   Allergies No Known Allergies   Home Medications  Prior to Admission medications   Medication Sig Start Date End Date Taking? Authorizing Provider  acetaminophen  (TYLENOL ) 500 MG tablet Take 500 mg by mouth every 6 (six) hours as needed for mild pain or headache.    [provider]  amLODipine  (NORVASC ) 10 MG tablet Take 1 tablet (10 mg total) by mouth daily. 12/17/23 03/16/24  Elmarie Hacking, FNP  ASPIR-LOW 81 MG EC tablet TAKE 1 TABLET BY MOUTH DAILY. 02/20/17   Darlis Eisenmenger,  MD  Blood Pressure Monitoring (BLOOD PRESSURE CUFF) MISC Please check and monitor blood pressure as advised in the clinic 12/17/23   Elmarie Hacking, FNP  carvedilol  (COREG ) 12.5 MG tablet Take 1 tablet (12.5 mg total) by mouth 2 (two) times daily with a meal. 12/17/23   [place]ford, Arlice Bene, FNP  flecainide  (TAMBOCOR ) 100 MG tablet Take 1 tablet (100 mg total) by mouth 2 (two) times daily. *need appt* 12/17/23   Elmarie Hacking, FNP  hydrALAZINE  (APRESOLINE ) 100 MG tablet Take 1 tablet (100 mg total) by mouth 3 (three) times daily. 12/17/23   Elmarie Hacking, FNP  isosorbide  mononitrate (IMDUR ) 30 MG 24 hr tablet Take 1 tablet (30 mg total) by mouth daily. 01/02/24   [place], Arlice Bene, FNP  losartan  (COZAAR ) 100 MG tablet Take 1 tablet (100 mg total) by mouth in the morning AND 1/2 tablet (50mg ) every evening. Pt needs to schedule appt with Dr. Lawana Pray for further refills - 3rd attempt. 12/17/23   Elmarie Hacking, FNP  spironolactone  (ALDACTONE ) 25 MG tablet Take 1 tablet (25 mg total) by mouth daily. 12/17/23   Elmarie Hacking, FNP     Critical care time: 57   The patient is critically ill with multiple organ system failure and requires high complexity decision making for assessment and support, frequent evaluation and titration of therapies, advanced monitoring, review of  radiographic studies and interpretation of complex data.   Critical Care Time devoted to patient care services, exclusive of separately billable procedures, described in this note is 38 minutes.   Claven Cumming, MD [facility] See Amion for pager  If no response to pager , please call [phone] until 7pm After 7:00 pm call Elink  [phone] 03/02/2024, 1:20 AM

## 2024-03-02 NOTE — Progress Notes (Signed)
 eLink Physician-Brief Progress Note Patient Name: Mandy Serrano DOB: [birth year] MRN: [id number]   Date of Service  03/02/2024  HPI/Events of Note  48 y.o. with history of obesity, HTN was on 5 anti-HTN medicines in [place]), sickle cell anemia, systolic heart failure 05/2016, headaches, and frequent PVCs.  Presents to the hospital with a PEA cardiac arrest with periods of VT and V-fib status post multiple shocks in 8 rounds of epinephrine currently ventilated with acute kidney injury and suspected pneumonia.  Patient has normal vitals saturating 100% on the ventilator with 100% FiO2.  Currently on amiodarone  infusion, norepinephrine infusion, vasopressin, and propofol.  Initial results consistent with severe acidemia and hypercapnia.  Patient has hyperkalemia with non-anion gap metabolic acidosis and acute kidney injury, troponin and BNP elevation with leukocytosis.  Tubes and lines in appropriate positioning.  eICU Interventions  Maintain vasopressor therapy with norepinephrine and vasopressin  Maintain amiodarone  and electrolyte repletion  Monitor electrolytes every 8 hours BMP  Repeat ABG to assess ventilator mechanics, obtain Co. oximetry  DVT prophylaxis with heparin  GI prophylaxis with famotidine     Intervention Category Evaluation Type: New Patient Evaluation  Nicholas Ossa 03/02/2024, 6:00 AM

## 2024-03-02 NOTE — Procedures (Signed)
 Central Venous Catheter Insertion Procedure Note  Zanya Clendenen  [id number]  07-14-1976  Date:03/02/24  Time:3:43 AM   Provider Performing:Katryna Tschirhart R Rexton Greulich   Procedure: Insertion of Non-tunneled Central Venous Catheter(36556) with US  guidance (62130)   Indication(s) Medication administration  Consent Risks of the procedure as well as the alternatives and risks of each were explained to the patient and/or caregiver.  Consent for the procedure was obtained and is signed in the bedside chart  Anesthesia Topical only with 1% lidocaine   Timeout Verified patient identification, verified procedure, site/side was marked, verified correct patient position, special equipment/implants available, medications/allergies/relevant history reviewed, required imaging and test results available.  Sterile Technique Maximal sterile technique including full sterile barrier drape, hand hygiene, sterile gown, sterile gloves, mask, hair covering, sterile ultrasound probe cover (if used).  Procedure Description Area of catheter insertion was cleaned with chlorhexidine and draped in sterile fashion.  With real-time ultrasound guidance a central venous catheter was placed into the left internal jugular vein. Nonpulsatile blood flow and easy flushing noted in all ports.  The catheter was sutured in place and sterile dressing applied.  Complications/Tolerance None; patient tolerated the procedure well. Chest X-ray is ordered to verify placement for internal jugular or subclavian cannulation.   Chest x-ray is not ordered for femoral cannulation.  EBL Minimal  Specimen(s) None   Patt Boozer Aniza Shor, PA-C

## 2024-03-02 NOTE — ED Notes (Signed)
 CC and family at bedside

## 2024-03-02 NOTE — Consult Note (Addendum)
 Cardiology Consultation   Patient ID: Mandy Serrano MRN: [id number]; DOB: 18-May-1976  Admit date: 03/02/2024 Date of Consult: 03/02/2024  PCP:  Vicente Graham, No   [facility] Providers Cardiologist:  None  Electrophysiologist:  Will Cortland Ding, MD       Patient Profile:   Mandy Serrano is a 48 y.o. female with a hx of nonischemic cardiomyopathy, EF 25% improved to LVEF of 60 to 65% on 2021, hypertension, obesity, sickle cell anemia, frequent PVCs on flecainide  who is being seen 03/02/2024 for the evaluation of status postcardiac arrest at the request of Dr. Candelaria Chaco.  History of Present Illness:   Mandy Serrano is a 48 y.o. female with a hx of nonischemic cardiomyopathy, EF 25% improved to LVEF of 60 to 65% on 2021, hypertension, obesity, sickle cell anemia, frequent PVCs on flecainide  who is being seen 03/02/2024 for the evaluation of status postcardiac arrest   Patient is brought in to the ER after witnessed cardiac arrest, total downtime prior to initiation of CPR was 12 minutes, PEA arrest was noted and CPR was started at some point patient also developed VF and VT for which she needed defibrillation and started on IV amiodarone , multiple rounds of epinephrine was done.  Per reports her total CPR time was 52 minutes.  Patient is on multiple pressors and has agonal breathing, EKG showed right bundle branch block. Patient is admitted in the critically care and cardiology is consulted. Currently she is on IV amiodarone , epinephrine drip, norepinephrine drip  Family at bedside reports she was doing fine normal health status.  Suddenly went to take medication in the evening and started shaking, has been noted that the patient was unresponsive and started CPR, called 911, and then long CPR session was commenced by EMS team EKG reviewed shows in fact sinus rhythm with right bundle branch block and possible left intrafascicular block.  No STEMI or acute ischemic changes.  Labs came back  with potassium 6.3, creatinine 1.57, AST ALT is elevated, lactic acid is 8.9 troponin is stable, troponin is in process  cho 7/19 EF 60-65%.   Bedside echo 04/2020 EF 60-65%.  Graduated from AHF clinic 05/2022, and referred to HTN clinic.  Echo 2019 Study Conclusions   - Left ventricle: The cavity size was normal. Wall thickness was    normal. Systolic function was normal. The estimated ejection    fraction was in the range of 60% to 65%.  - Aortic valve: There was mild regurgitation.  - Mitral valve: There was mild regurgitation.  - Left atrium: The atrium was moderately dilated.  - Atrial septum: No defect or patent foramen ovale was identified.   MRI 2017 IMPRESSION: 1. Moderate left ventricular dilatation with moderate concentric hypertrophy and severely decreased systolic function (LVEF = 20%). There is diffuse hypokinesis. No late gadolinium enhancement was seen.   2. Normal right ventricular size, thickness and borderline systolic function (RVEF =44%, normal > 45%) with no regional wall motion abnormalities.   3. Mildly to moderately dilated left atrium. Normal size of the right atrium.   4. Mild to moderate mitral regurgitation, mild tricuspid regurgitation. Mild aortic regurgitation.   Collectively, these findings are consistent with advanced stage of hypertensive cardiomyopathy. There is no evidence of ischemic, infiltrative or inflammatory cardiomyopathy.  Past Medical History:  Diagnosis Date   Malignant hypertension     Past Surgical History:  Procedure Laterality Date   CESAREAN SECTION       Home Medications:  Prior to Admission  medications   Medication Sig Start Date End Date Taking? Authorizing Provider  acetaminophen  (TYLENOL ) 500 MG tablet Take 500 mg by mouth every 6 (six) hours as needed for mild pain or headache.    [provider]  amLODipine  (NORVASC ) 10 MG tablet Take 1 tablet (10 mg total) by mouth daily. 12/17/23 03/16/24  Elmarie Hacking, FNP  ASPIR-LOW 81 MG EC tablet TAKE 1 TABLET BY MOUTH DAILY. 02/20/17   Darlis Eisenmenger, MD  Blood Pressure Monitoring (BLOOD PRESSURE CUFF) MISC Please check and monitor blood pressure as advised in the clinic 12/17/23   Elmarie Hacking, FNP  carvedilol  (COREG ) 12.5 MG tablet Take 1 tablet (12.5 mg total) by mouth 2 (two) times daily with a meal. 12/17/23   Milford, Arlice Bene, FNP  flecainide  (TAMBOCOR ) 100 MG tablet Take 1 tablet (100 mg total) by mouth 2 (two) times daily. *need appt* 12/17/23   Elmarie Hacking, FNP  hydrALAZINE  (APRESOLINE ) 100 MG tablet Take 1 tablet (100 mg total) by mouth 3 (three) times daily. 12/17/23   Elmarie Hacking, FNP  isosorbide  mononitrate (IMDUR ) 30 MG 24 hr tablet Take 1 tablet (30 mg total) by mouth daily. 01/02/24   Mil[place], Arlice Bene, FNP  losartan  (COZAAR ) 100 MG tablet Take 1 tablet (100 mg total) by mouth in the morning AND 1/2 tablet (50mg ) every evening. Pt needs to schedule appt with Dr. Lawana Pray for further refills - 3rd attempt. 12/17/23   Elmarie Hacking, FNP  spironolactone  (ALDACTONE ) 25 MG tablet Take 1 tablet (25 mg total) by mouth daily. 12/17/23   Elmarie Hacking, FNP    Inpatient Medications: Scheduled Meds:  docusate  100 mg Per Tube BID   polyethylene glycol  17 g Per Tube Daily   Continuous Infusions:  amiodarone  60 mg/hr (03/02/24 0025)   Followed by   amiodarone      fentaNYL infusion INTRAVENOUS 150 mcg/hr (03/02/24 0053)   norepinephrine (LEVOPHED) Adult infusion 2 mcg/min (03/02/24 0113)   PRN Meds: fentaNYL  Allergies:   No Known Allergies  Social History:   Social History   Socioeconomic History   Marital status: Married    Spouse name: Not on file   Number of children: Not on file   Years of education: Not on file   Highest education level: Not on file  Occupational History   Not on file  Tobacco Use   Smoking status: Never    Passive exposure: Past   Smokeless tobacco: Never  Substance and  Sexual Activity   Alcohol use: No   Drug use: No   Sexual activity: Not on file  Other Topics Concern   Not on file  Social History Narrative   Not on file   Social Drivers of Health   Financial Resource Strain: Medium Risk (12/17/2023)   Overall Financial Resource Strain (CARDIA)    Difficulty of Paying Living Expenses: Somewhat hard  Food Insecurity: Not on file  Transportation Needs: Not on file  Physical Activity: Not on file  Stress: Not on file  Social Connections: Not on file  Intimate Partner Violence: Not on file    Family History:    Family History  Problem Relation Age of Onset   Hypertension Mother    Hypertension Father      ROS:  Please see the history of present illness.   All other ROS reviewed and negative.     Physical Exam/Data:   Vitals:   03/02/24 0113 03/02/24 0114 03/02/24  0115 03/02/24 0116  BP: (!) 103/58  104/70   Pulse: 65 61 (!) 56 (!) 58  Resp: (!) 27 (!) 28 (!) 28 (!) 32  Temp: 100 F (37.8 C) 100 F (37.8 C) 100.1 F (37.8 C) 100.1 F (37.8 C)  SpO2: 94% 93% 95% 95%   No intake or output data in the 24 hours ending 03/02/24 0118    01/07/2024    9:19 AM 01/02/2024    3:25 PM 12/25/2023    2:56 PM  Last 3 Weights  Weight (lbs) 224 lb 224 lb 12.8 oz 225 lb  Weight (kg) 101.606 kg 101.969 kg 102.059 kg     There is no height or weight on file to calculate BMI.  General: Intubated and sedated HEENT: normal Neck: no JVD Vascular: No carotid bruits; Distal pulses 2+ bilaterally Cardiac:  normal S1, S2; RRR; no murmur  Lungs: Coarse breath sounds heard Abd: soft, nontender, no hepatomegaly  Ext: no edema Musculoskeletal:  No deformities, BUE and BLE strength normal and equal Skin: warm and dry  Neuro: Intubated and sedated Psych:   As noted above  Laboratory Data:  High Sensitivity Troponin:  No results for input(s): "TROPONINIHS" in the last 720 hours.   Chemistry Recent Labs  Lab 03/02/24 0036 03/02/24 0115  NA 135  131*  K 3.9 6.3*  CL 102  --   CO2 16*  --   GLUCOSE 324*  --   BUN 26*  --   CREATININE 1.57*  --   CALCIUM 9.3  --   GFRNONAA 40*  --   ANIONGAP 17*  --     Recent Labs  Lab 03/02/24 0036  PROT 6.5  ALBUMIN 3.0*  AST 252*  ALT 209*  ALKPHOS 66  BILITOT 0.6   Lipids No results for input(s): "CHOL", "TRIG", "HDL", "LABVLDL", "LDLCALC", "CHOLHDL" in the last 168 hours.  Hematology Recent Labs  Lab 03/02/24 0036 03/02/24 0115  WBC 27.5*  --   RBC 3.99  --   HGB 12.1 13.3  HCT 40.1 39.0  MCV 100.5*  --   MCH 30.3  --   MCHC 30.2  --   RDW 14.2  --   PLT 281  --    Thyroid  No results for input(s): "TSH", "FREET4" in the last 168 hours.  BNPNo results for input(s): "BNP", "PROBNP" in the last 168 hours.  DDimer No results for input(s): "DDIMER" in the last 168 hours.   Radiology/Studies:  DG Chest Portable 1 View Result Date: 03/02/2024 CLINICAL DATA:  Intubation post arrest EXAM: PORTABLE CHEST 1 VIEW COMPARISON:  Chest x-ray 06/13/2016 FINDINGS: Endotracheal tube with tip terminating approximately 2-3 cm above the carina. Enteric tube courses below the hemidiaphragm with tip overlying the expected region the gastric lumen. Side port not visualized. Cardiac paddles overlie the chest. The heart and mediastinal contours are unchanged. Biapical interstitial airspace opacities. No pleural effusion. No pneumothorax. No acute osseous abnormality. IMPRESSION: 1. Biapical interstitial airspace opacities. Finding could represent edema versus infection. 2.  Lines and tubes as above. Electronically Signed   By: Morgane  Naveau M.D.   On: 03/02/2024 01:04     Assessment and Plan:   Cardiac arrest reported initially in the ED PEA status post ROSC, 50 minutes of CPR. VF/VT status post ROSC, status post shocked-currently on amiodarone  drip Nonischemic cardiomyopathy, improved LVEF to 60 to 65% on 2019 and 2021, prior EF 25%. History of PVCs-was on flecainide . Hypertension,  hyperlipidemia, obesity, sickle cell anemia  Plan: -> Patient is admitted in critical care unit, under critical care team and cardiology is consulted. Continue IV amiodarone  for now.  EKG shows sinus rhythm with right bundle branch block possible left intrafascicular block.  No acute ischemic changes.  No indication for urgent Cath Lab, patient did not have or complain of chest pain prior to cardiac arrest, per report from husband patient only reported she was feeling any and then had shaking minutes and then became unresponsive --Obtain echocardiogram in a.m. Continue to trend troponin, labs, lactic acid.  --> Overall poor prognosis in this lady with 52 minutes of CPR, agonal breathing.  pH 6.9..  Obtain CTA head/perfusion study to check anoxic brain injury. Unclear why this patient suddenly had cardiac arrest, possible other differentials is PE although less likely, CT PE can also be considered. --> Intubated and sedated, continue pressor requirement to keep MAP above 65 Discuss goals of care in the morning.   We will follow   Risk Assessment/Risk Scores:        [place]  Heart Association (NYHA) Functional Class NYHA Class IV        For questions or updates, please contact [facility] Please consult [email or web address] for contact info under    Signed, Cran[place] Dk, MD  03/02/2024 1:18 AM

## 2024-03-02 NOTE — ED Provider Notes (Signed)
 [place] EMERGENCY DEPARTMENT AT [facility] Provider Note   CSN: [id number] Arrival date & time: 03/02/24  0009     History  Chief Complaint  Patient presents with   Cardiac Arrest    Mandy Serrano is a 48 y.o. female.  The history is provided by a relative and the EMS personnel. The history is limited by the condition of the patient (Patient unresponsive and intubated).  Cardiac Arrest She has history of hypertension, heart failure and was brought in by EMS with CPR in progress.  Per family, she stated that she did not feel well and asked for her medication.  She then had some generalized shaking and became unresponsive.  Family started CPR which was continued by police first responders are and then fire rescue and then EMS.  EMS intubated the patient and noted pulseless electrical activity.  She did have 2 episodes of ventricular fibrillation 1 episode of ventricular tachycardia which were treated with defibrillation and she was given amiodarone .  En route, she received a total of 8 mg of epinephrine.  She did have agonal respirations although no pulses were palpable.  Family states that she had no complaints prior to this evening.  Per EMS, total time of CPR was 52 minutes.   Home Medications Prior to Admission medications   Medication Sig Start Date End Date Taking? Authorizing Provider  acetaminophen  (TYLENOL ) 500 MG tablet Take 500 mg by mouth every 6 (six) hours as needed for mild pain or headache.    [provider]  amLODipine  (NORVASC ) 10 MG tablet Take 1 tablet (10 mg total) by mouth daily. 12/17/23 03/16/24  Elmarie Hacking, FNP  ASPIR-LOW 81 MG EC tablet TAKE 1 TABLET BY MOUTH DAILY. 02/20/17   Darlis Eisenmenger, MD  Blood Pressure Monitoring (BLOOD PRESSURE CUFF) MISC Please check and monitor blood pressure as advised in the clinic 12/17/23   Elmarie Hacking, FNP  carvedilol  (COREG ) 12.5 MG tablet Take 1 tablet (12.5 mg total) by mouth 2 (two) times  daily with a meal. 12/17/23   Milford, Arlice Bene, FNP  flecainide  (TAMBOCOR ) 100 MG tablet Take 1 tablet (100 mg total) by mouth 2 (two) times daily. *need appt* 12/17/23   Elmarie Hacking, FNP  hydrALAZINE  (APRESOLINE ) 100 MG tablet Take 1 tablet (100 mg total) by mouth 3 (three) times daily. 12/17/23   Elmarie Hacking, FNP  isosorbide  mononitrate (IMDUR ) 30 MG 24 hr tablet Take 1 tablet (30 mg total) by mouth daily. 01/02/24   Milford, Arlice Bene, FNP  losartan  (COZAAR ) 100 MG tablet Take 1 tablet (100 mg total) by mouth in the morning AND 1/2 tablet (50mg ) every evening. Pt needs to schedule appt with Dr. Lawana Pray for further refills - 3rd attempt. 12/17/23   Milford, Arlice Bene, FNP  spironolactone  (ALDACTONE ) 25 MG tablet Take 1 tablet (25 mg total) by mouth daily. 12/17/23   Elmarie Hacking, FNP      Allergies    Patient has no known allergies.    Review of Systems   Review of Systems  Unable to perform ROS: Intubated    Physical Exam Updated Vital Signs BP 100/61   Pulse 63   Temp 99.4 F (37.4 C)   Resp (!) 21   SpO2 93%  Physical Exam Vitals and nursing note reviewed.   48 year old female, resting comfortably and in no acute distress. Vital signs are significant for borderline elevated respirations which are agonal in nature. Oxygen saturation  is 93%, which is normal. Head is normocephalic and atraumatic.  Pupils are midposition and fixed, but has received a large amount of epinephrine en route.  Endotracheal tube is in place. Lungs have symmetric air movement. Chest is nontender. Heart tones are distant. Abdomen is soft, flat. Extremities have no edema.  Intraosseous line is present in the left tibia. Neurologic: Unresponsive to painful stimuli, has agonal respirations but no other neurologic signs of life.  GCS = 3.  ED Results / Procedures / Treatments   Labs (all labs ordered are listed, but only abnormal results are displayed) Labs Reviewed  CBC WITH  DIFFERENTIAL/PLATELET - Abnormal; Notable for the following components:      Result Value   WBC 27.5 (*)    MCV 100.5 (*)    All other components within normal limits  CBG MONITORING, ED - Abnormal; Notable for the following components:   Glucose-Capillary 295 (*)    All other components within normal limits  I-STAT CG4 LACTIC ACID, ED - Abnormal; Notable for the following components:   Lactic Acid, Venous 8.9 (*)    All other components within normal limits  COMPREHENSIVE METABOLIC PANEL WITH GFR  URINALYSIS, ROUTINE W REFLEX MICROSCOPIC  I-STAT ARTERIAL BLOOD GAS, ED  TROPONIN I (HIGH SENSITIVITY)    EKG ED ECG REPORT   Date: 03/02/2024  Rate: 72  Rhythm: normal sinus rhythm  QRS Axis: left  Intervals: normal  ST/T Wave abnormalities: normal  Conduction Disutrbances:right bundle branch block and left anterior fascicular block  Narrative Interpretation: Sinus rhythm with right bundle branch block and left anterior fascicular block.  No ST or T changes.  Computer reading states atrial fibrillation which I disagree with.  When compared with ECG of 01/07/2024, right bundle branch block is new, PVCs are no longer present.  Old EKG Reviewed: changes noted  I have personally reviewed the EKG tracing and disagree with the computerized printout as noted.  Radiology DG Chest Portable 1 View Result Date: 03/02/2024 CLINICAL DATA:  Intubation post arrest EXAM: PORTABLE CHEST 1 VIEW COMPARISON:  Chest x-ray 06/13/2016 FINDINGS: Endotracheal tube with tip terminating approximately 2-3 cm above the carina. Enteric tube courses below the hemidiaphragm with tip overlying the expected region the gastric lumen. Side port not visualized. Cardiac paddles overlie the chest. The heart and mediastinal contours are unchanged. Biapical interstitial airspace opacities. No pleural effusion. No pneumothorax. No acute osseous abnormality. IMPRESSION: 1. Biapical interstitial airspace opacities. Finding could  represent edema versus infection. 2.  Lines and tubes as above. Electronically Signed   By: Morgane  Naveau M.D.   On: 03/02/2024 01:04    Procedures Ultrasound ED Echo  Date/Time: 03/02/2024 1:30 AM  Performed by: Alissa April, MD Authorized by: Alissa April, MD   Procedure details:    Indications: cardiac arrest     Views: parasternal long axis view     Images: archived     Limitations:  Body habitus and acoustic shadowing Findings:    Pericardium: no pericardial effusion     LV Function comment:  Unsable to assess   RV Diameter comment:  Unable to assess   IVC comment:  Unable to assess Impression:    Impression comment:  Cardiac activty present   Cardiac monitor shows sinus rhythm, per my interpretation.  Medications Ordered in ED Medications  amiodarone  (NEXTERONE ) 1.8 mg/mL load via infusion 150 mg (150 mg Intravenous Bolus from Bag 03/02/24 0025)    Followed by  amiodarone  (NEXTERONE  PREMIX) 360-4.14 MG/200ML-% (1.8 mg/mL)  IV infusion (60 mg/hr Intravenous New Bag/Given 03/02/24 0025)    Followed by  amiodarone  (NEXTERONE  PREMIX) 360-4.14 MG/200ML-% (1.8 mg/mL) IV infusion (has no administration in time range)  docusate (COLACE) 50 MG/5ML liquid 100 mg (has no administration in time range)  polyethylene glycol (MIRALAX / GLYCOLAX) packet 17 g (has no administration in time range)  fentaNYL 2500mcg in NS (10mcg/ml) infusion-PREMIX (150 mcg/hr Intravenous Rate/Dose Change 03/02/24 0053)  fentaNYL (SUBLIMAZE) bolus via infusion 50-100 mcg (50 mcg Intravenous Bolus from Bag 03/02/24 0053)  norepinephrine (LEVOPHED) 4mg  in 250mL (0.016 mg/mL) premix infusion (has no administration in time range)  EPINEPHrine (ADRENALIN) 1 MG/10ML injection (1 mg Intravenous Given 03/02/24 0011)  calcium chloride injection (1 g Intravenous Given 03/02/24 0012)  sodium bicarbonate injection (50 mEq Intravenous Given 03/02/24 0013)  fentaNYL (SUBLIMAZE) injection 50 mcg (50 mcg Intravenous Given  03/02/24 0030)  EPINEPHrine (ADRENALIN) 1 MG/10ML injection (1 mg Intravenous Given 03/02/24 0039)    ED Course/ Medical Decision Making/ A&P                                 Medical Decision Making Amount and/or Complexity of Data Reviewed Labs: ordered. Radiology: ordered.  Risk OTC drugs. Prescription drug management. Decision regarding hospitalization.   Out-of-hospital arrest with patient arriving in pulseless electrical activity.  On arrival, I did a pulse check and could not feel a femoral pulse.  I ordered epinephrine, sodium bicarbonate, calcium and following this, I still could not feel femoral pulses.  I did a limited bedside ultrasound and did note cardiac activity and nurses were able to feel a radial pulse.  Blood pressure was adequate.  CPR was stopped.  Heart rate gradually slowed down and blood pressure dropped and I gave additional epinephrine and started her on an epinephrine drip.  Portable chest x-ray showed adequate position of endotracheal tube, probable pulmonary edema.  I independently viewed the image, and agree with the radiologist's interpretation.  Electrocardiogram shows new right bundle branch block but no ischemic changes.  I have reviewed her laboratory tests, and my interpretation is marked leukocytosis which is felt to be stress related, normal hemoglobin although dropped 2 g compared with 12/17/2023, acute kidney injury with creatinine 1.57 compared with baseline 1.08, metabolic acidosis felt to be secondary to cardiac arrest, elevated glucose level at least partly due to stress, elevated transaminases concerning for hepatic injury from cardiac arrest versus medication induced hepatitis, markedly elevated lactic acid consistent with poor perfusion status.  Arterial blood gases shows severe acidosis which is mainly metabolic but also respiratory.  I have adjusted respirator settings.  I discussed her condition with the family including the fact that she is critically  ill and may not survive.  I have discussed case with Dr.Shah of critical care service who agrees to admit the patient.  I have also discussed the case with Dr. Jeryl Moris of cardiology service who agrees to see the patient in consultation.  Cardiopulmonary Resuscitation (CPR) Procedure Note Directed/Performed by: Alissa April I personally directed ancillary staff and/or performed CPR in an effort to regain return of spontaneous circulation and to maintain cardiac, neuro and systemic perfusion.   CRITICAL CARE Performed by: Alissa April Total critical care time: 135 minutes Critical care time was exclusive of separately billable procedures and treating other patients. Critical care was necessary to treat or prevent imminent or life-threatening deterioration. Critical care was time spent personally by me on  the following activities: development of treatment plan with patient and/or surrogate as well as nursing, discussions with consultants, evaluation of patient's response to treatment, examination of patient, obtaining history from patient or surrogate, ordering and performing treatments and interventions, ordering and review of laboratory studies, ordering and review of radiographic studies, pulse oximetry and re-evaluation of patient's condition.        Final Clinical Impression(s) / ED Diagnoses Final diagnoses:  None    Rx / DC Orders ED Discharge Orders     None         Alissa April, MD 03/02/24 [id number]

## 2024-03-02 NOTE — ED Notes (Signed)
 EDP Candelaria Chaco at bedside updated family. Repeat EKG as first EKG did not cross over to MUSE. Copy of original EKG at bedside. EDP updated increase of Levo to 3 peripheral.

## 2024-03-03 ENCOUNTER — Inpatient Hospital Stay (HOSPITAL_COMMUNITY): Payer: MEDICAID

## 2024-03-03 DIAGNOSIS — R569 Unspecified convulsions: Secondary | ICD-10-CM

## 2024-03-03 DIAGNOSIS — G9340 Encephalopathy, unspecified: Secondary | ICD-10-CM

## 2024-03-03 DIAGNOSIS — I5020 Unspecified systolic (congestive) heart failure: Secondary | ICD-10-CM

## 2024-03-03 DIAGNOSIS — I10 Essential (primary) hypertension: Secondary | ICD-10-CM

## 2024-03-03 DIAGNOSIS — I469 Cardiac arrest, cause unspecified: Secondary | ICD-10-CM

## 2024-03-03 LAB — BASIC METABOLIC PANEL WITH GFR
Anion gap: 12 (ref 5–15)
BUN: 50 mg/dL — ABNORMAL HIGH (ref 6–20)
CO2: 23 mmol/L (ref 22–32)
Calcium: 7.8 mg/dL — ABNORMAL LOW (ref 8.9–10.3)
Chloride: 103 mmol/L (ref 98–111)
Creatinine, Ser: 3.23 mg/dL — ABNORMAL HIGH (ref 0.44–1.00)
GFR, Estimated: 17 mL/min — ABNORMAL LOW (ref >60)
Glucose, Bld: 149 mg/dL — ABNORMAL HIGH (ref 70–99)
Potassium: 4 mmol/L (ref 3.5–5.1)
Sodium: 138 mmol/L (ref 135–145)

## 2024-03-03 LAB — CBC
HCT: 34.9 % — ABNORMAL LOW (ref 36.0–46.0)
Hemoglobin: 11.2 g/dL — ABNORMAL LOW (ref 12.0–15.0)
MCH: 29.9 pg (ref 26.0–34.0)
MCHC: 32.1 g/dL (ref 30.0–36.0)
MCV: 93.1 fL (ref 80.0–100.0)
Platelets: 193 10*3/uL (ref 150–400)
RBC: 3.75 MIL/uL — ABNORMAL LOW (ref 3.87–5.11)
RDW: 14.7 % (ref 11.5–15.5)
WBC: 11.3 10*3/uL — ABNORMAL HIGH (ref 4.0–10.5)
nRBC: 0 % (ref 0.0–0.2)

## 2024-03-03 LAB — COMPREHENSIVE METABOLIC PANEL WITH GFR
ALT: 141 U/L — ABNORMAL HIGH (ref 0–44)
AST: 93 U/L — ABNORMAL HIGH (ref 15–41)
Albumin: 2.5 g/dL — ABNORMAL LOW (ref 3.5–5.0)
Alkaline Phosphatase: 46 U/L (ref 38–126)
Anion gap: 9 (ref 5–15)
BUN: 48 mg/dL — ABNORMAL HIGH (ref 6–20)
CO2: 24 mmol/L (ref 22–32)
Calcium: 8 mg/dL — ABNORMAL LOW (ref 8.9–10.3)
Chloride: 104 mmol/L (ref 98–111)
Creatinine, Ser: 3.02 mg/dL — ABNORMAL HIGH (ref 0.44–1.00)
GFR, Estimated: 18 mL/min — ABNORMAL LOW (ref >60)
Glucose, Bld: 192 mg/dL — ABNORMAL HIGH (ref 70–99)
Potassium: 3.6 mmol/L (ref 3.5–5.1)
Sodium: 137 mmol/L (ref 135–145)
Total Bilirubin: 0.3 mg/dL (ref 0.0–1.2)
Total Protein: 5.7 g/dL — ABNORMAL LOW (ref 6.5–8.1)

## 2024-03-03 LAB — MAGNESIUM
Magnesium: 1.6 mg/dL — ABNORMAL LOW (ref 1.7–2.4)
Magnesium: 1.1 mg/dL — ABNORMAL LOW (ref 1.7–2.4)
Magnesium: 2.1 mg/dL (ref 1.7–2.4)

## 2024-03-03 LAB — ECHOCARDIOGRAM COMPLETE
Area-P 1/2: 3.5 cm2
Height: 64.016 in
S' Lateral: 3.1 cm
Weight: 3583.8 [oz_av]

## 2024-03-03 LAB — GLUCOSE, CAPILLARY
Glucose-Capillary: 105 mg/dL — ABNORMAL HIGH (ref 70–99)
Glucose-Capillary: 112 mg/dL — ABNORMAL HIGH (ref 70–99)
Glucose-Capillary: 122 mg/dL — ABNORMAL HIGH (ref 70–99)
Glucose-Capillary: 142 mg/dL — ABNORMAL HIGH (ref 70–99)
Glucose-Capillary: 145 mg/dL — ABNORMAL HIGH (ref 70–99)
Glucose-Capillary: 146 mg/dL — ABNORMAL HIGH (ref 70–99)
Glucose-Capillary: 88 mg/dL (ref 70–99)

## 2024-03-03 LAB — TRIGLYCERIDES: Triglycerides: 110 mg/dL (ref <150)

## 2024-03-03 LAB — HEMOGLOBIN A1C
Hgb A1c MFr Bld: 5.8 % — ABNORMAL HIGH (ref 4.8–5.6)
Mean Plasma Glucose: 120 mg/dL

## 2024-03-03 LAB — PATHOLOGIST SMEAR REVIEW

## 2024-03-03 LAB — TROPONIN I (HIGH SENSITIVITY): Troponin I (High Sensitivity): 165 ng/L (ref <18)

## 2024-03-03 LAB — PHOSPHORUS: Phosphorus: 3.1 mg/dL (ref 2.5–4.6)

## 2024-03-03 MED ORDER — MAGNESIUM SULFATE 2 GM/50ML IV SOLN
2.0000 g | Freq: Once | INTRAVENOUS | Status: AC
  Administered 2024-03-03: 2 g via INTRAVENOUS
  Filled 2024-03-03: qty 50

## 2024-03-03 MED ORDER — DOCUSATE SODIUM 50 MG/5ML PO LIQD: 100.0000 mg | Freq: Two times a day (BID) | ORAL | Status: DC

## 2024-03-03 MED ORDER — PERFLUTREN LIPID MICROSPHERE
1.0000 mL | INTRAVENOUS | Status: AC | PRN
  Administered 2024-03-03: 2 mL via INTRAVENOUS

## 2024-03-03 MED ORDER — PANTOPRAZOLE SODIUM 40 MG IV SOLR: 40.0000 mg | INTRAVENOUS | Status: DC

## 2024-03-03 MED ORDER — PANTOPRAZOLE SODIUM 40 MG IV SOLR
40.0000 mg | INTRAVENOUS | Status: DC
  Administered 2024-03-03: 40 mg via INTRAVENOUS
  Filled 2024-03-03: qty 10

## 2024-03-03 MED ORDER — AMPICILLIN-SULBACTAM SODIUM 3 (2-1) G IJ SOLR
3.0000 g | Freq: Two times a day (BID) | INTRAMUSCULAR | Status: AC
  Administered 2024-03-03 – 2024-03-04 (×3): 3 g via INTRAVENOUS
  Filled 2024-03-03 (×3): qty 8

## 2024-03-03 MED ORDER — IPRATROPIUM-ALBUTEROL 0.5-2.5 (3) MG/3ML IN SOLN: 3.0000 mL | RESPIRATORY_TRACT | Status: DC | PRN

## 2024-03-03 MED ORDER — POLYETHYLENE GLYCOL 3350 17 G PO PACK
17.0000 g | PACK | Freq: Every day | ORAL | Status: DC
  Administered 2024-03-04 – 2024-03-07 (×4): 17 g via ORAL
  Filled 2024-03-03 (×6): qty 1

## 2024-03-03 MED ORDER — POTASSIUM CHLORIDE 10 MEQ/100ML IV SOLN
10.0000 meq | INTRAVENOUS | Status: AC
  Administered 2024-03-03 (×2): 10 meq via INTRAVENOUS
  Filled 2024-03-03 (×2): qty 100

## 2024-03-03 MED ORDER — POLYETHYLENE GLYCOL 3350 17 G PO PACK
17.0000 g | PACK | Freq: Every day | ORAL | Status: DC | PRN
Start: 2024-03-03 — End: 2024-03-13

## 2024-03-03 MED ORDER — ASPIRIN 81 MG PO CHEW
81.0000 mg | CHEWABLE_TABLET | Freq: Every day | ORAL | Status: DC
  Administered 2024-03-04 – 2024-03-12 (×8): 81 mg via ORAL
  Filled 2024-03-03 (×9): qty 1

## 2024-03-03 MED ORDER — ASPIRIN 81 MG PO CHEW
81.0000 mg | CHEWABLE_TABLET | Freq: Every day | ORAL | Status: DC
  Administered 2024-03-03: 81 mg
  Filled 2024-03-03: qty 1

## 2024-03-03 NOTE — Procedures (Addendum)
 Routine EEG Report  Mandy Serrano is a 48 y.o. female with a history of cardiac arrest who is undergoing an EEG to evaluate for seizures.  Report: This EEG was acquired with electrodes placed according to the International 10-20 electrode system (including Fp1, Fp2, F3, F4, C3, C4, P3, P4, O1, O2, T3, T4, T5, T6, A1, A2, Fz, Cz, Pz). The following electrodes were missing or displaced: none.  There was no clear waking rhythm. Background consists of burst suppression pattern with bursts containing theta-delta activity of 4-6 Hz. This activity is reactive to stimulation. No sleep architecture identified. There was no focal slowing. There were no interictal epileptiform discharges. There were no electrographic seizures identified. Photic stimulation and hyperventilation not performed.  Impression and clinical correlation: This EEG was obtained while sedated and comatose and is abnormal due to burst suppression pattern indicative of global cerebral dysfunction, medication effect or both. Epileptiform abnormalities were not seen during this recording.  [name], MD [facility] [phone]  If 7pm- 7am, please page neurology on call as listed in AMION.

## 2024-03-03 NOTE — Procedures (Signed)
 Extubation Procedure Note  Patient Details:   Name: Mandy Serrano DOB: [birth year] MRN: [id number]   Airway Documentation:    Vent end date: 03/03/24 Vent end time: 0955   Evaluation  O2 sats: stable throughout Complications: No apparent complications Patient did tolerate procedure well. Bilateral Breath Sounds: Clear, Diminished   No,  Per CCM order, RT extubated pt to [place]. Prior to extubation pt did have a positive cuff leak. Pt tolerated well with SVS and no stridor noted. Pt is slow to respond and did not tell me her name, but will follow commands.  Ozro Russett R 03/03/2024, 9:55 AM

## 2024-03-03 NOTE — Evaluation (Signed)
 Physical Therapy Evaluation Patient Details Name: Mandy Serrano MRN: [id number] DOB: Dec 07, 1975 Today's Date: 03/03/2024  History of Present Illness  48 yo female admitted 5/11 after found down approximately prior to CPR of 52 minutes. Intubated 5/11-5/12. 5/12 EEG with global cerebral dysfunction. PMhx: HTN, CHF, obesity, PVC, sickle cell anemia  Clinical Impression  Pt admitted secondary to problem above with deficits below. PTA patient lived with daughter, son-in-law, and grandchildren. They live in a 2 story home with pt's bedroom and only showers on second floor. She was independent with mobility and owns no DME. Pt currently requires max to +2 total assist for bed mobility and was unable to stand with +1 assist. She has very slow processing and frequently required daughter to translate to [place] and then would follow command ~75% of the time. Anticipate patient will benefit from PT to address problems listed below. Will continue to follow acutely to maximize functional mobility, independence, and safety.  Patient will benefit from continued inpatient follow up therapy, >3 hours/day.           If plan is discharge home, recommend the following: A little help with walking and/or transfers;A little help with bathing/dressing/bathroom;Assistance with cooking/housework;Direct supervision/assist for medications management;Direct supervision/assist for financial management;Assist for transportation;Help with stairs or ramp for entrance;Supervision due to cognitive status   Can travel by private vehicle        Equipment Recommendations Rolling walker (2 wheels)  Recommendations for Other Services  Rehab consult;Speech consult    Functional Status Assessment Patient has had a recent decline in their functional status and demonstrates the ability to make significant improvements in function in a reasonable and predictable amount of time.     Precautions / Restrictions  Precautions Precautions: Fall Recall of Precautions/Restrictions: Impaired      Mobility  Bed Mobility Overal bed mobility: Needs Assistance Bed Mobility: Supine to Sit, Rolling, Sit to Supine Rolling: Total assist   Supine to sit: Max assist, HOB elevated Sit to supine: Max assist, +2 for physical assistance   General bed mobility comments: HOB elevated and use of pad to bring pt to sit EOB; assists with initiating sit to side with +2 assist to control torso and to raise legs    Transfers                   General transfer comment: attempted lateral scoot along EOB with pt unable to initiate    Ambulation/Gait               General Gait Details: unable  Stairs            Wheelchair Mobility     Tilt Bed    Modified Rankin (Stroke Patients Only)       Balance Overall balance assessment: Needs assistance Sitting-balance support: No upper extremity supported, Feet supported Sitting balance-Leahy Scale: Fair Sitting balance - Comments: sat EOB 8 minutes       Standing balance comment: unable                             Pertinent Vitals/Pain Pain Assessment Pain Assessment: No/denies pain    Home Living Family/patient expects to be discharged to:: Private residence Living Arrangements: Children;Other relatives (daughter, SIL, grandchildren) Available Help at Discharge: Family;Available 24 hours/day Type of Home: House Home Access: Level entry     Alternate Level Stairs-Number of Steps: flight Home Layout: Two level;Bed/bath upstairs Home Equipment: None  Prior Function Prior Level of Function : Independent/Modified Independent                     Extremity/Trunk Assessment   Upper Extremity Assessment Upper Extremity Assessment: Generalized weakness (bil UE edema; slow to follow commands)    Lower Extremity Assessment Lower Extremity Assessment: Generalized weakness (bil LE edema)    Cervical /  Trunk Assessment Cervical / Trunk Assessment: Other exceptions Cervical / Trunk Exceptions: overweight  Communication   Communication Communication: Impaired Factors Affecting Communication: Other (comment) ([place] is primary language, although normally fluent in [place] per daughter)    Cognition Arousal: Alert Behavior During Therapy: WFL for tasks assessed/performed   PT - Cognitive impairments: Orientation, Initiation, Sequencing, Problem solving   Orientation impairments: Place, Time, Situation                   PT - Cognition Comments: pt following commands better when daughter repeats them in [place] (pt's primary language) Following commands: Impaired Following commands impaired: Follows one step commands with increased time, Follows one step commands inconsistently     Cueing Cueing Techniques: Verbal cues, Gestural cues, Tactile cues, Visual cues     General Comments General comments (skin integrity, edema, etc.): Daughter present and translating to [place] when not following commands in [place] and pt then following ~75% of the time    Exercises     Assessment/Plan    PT Assessment Patient needs continued PT services  PT Problem List Decreased strength;Decreased balance;Decreased mobility;Decreased cognition;Decreased knowledge of use of DME;Decreased safety awareness;Decreased knowledge of precautions;Obesity       PT Treatment Interventions DME instruction;Gait training;Stair training;Functional mobility training;Therapeutic activities;Therapeutic exercise;Balance training;Neuromuscular re-education;Cognitive remediation;Patient/family education    PT Goals (Current goals can be found in the Care Plan section)  Acute Rehab PT Goals Patient Stated Goal: unable to state PT Goal Formulation: With family Time For Goal Achievement: 03/17/24 Potential to Achieve Goals: Good    Frequency Min 3X/week     Co-evaluation               AM-PAC PT "6  Clicks" Mobility  Outcome Measure Help needed turning from your back to your side while in a flat bed without using bedrails?: Total Help needed moving from lying on your back to sitting on the side of a flat bed without using bedrails?: Total Help needed moving to and from a bed to a chair (including a wheelchair)?: Total Help needed standing up from a chair using your arms (e.g., wheelchair or bedside chair)?: Total Help needed to walk in hospital room?: Total Help needed climbing 3-5 steps with a railing? : Total 6 Click Score: 6    End of Session Equipment Utilized During Treatment: Oxygen Activity Tolerance: Patient tolerated treatment well Patient left: in bed;with call bell/phone within reach;with family/visitor present;with nursing/sitter in room Nurse Communication: Mobility status PT Visit Diagnosis: Muscle weakness (generalized) (M62.81);Difficulty in walking, not elsewhere classified (R26.2)    Time: 1610-9604 PT Time Calculation (min) (ACUTE ONLY): 29 min   Charges:   PT Evaluation $PT Eval Low Complexity: 1 Low PT Treatments $Therapeutic Activity: 8-22 mins PT General Charges $$ ACUTE PT VISIT: 1 Visit          Gayle Kava, PT Acute Rehabilitation Services  Office [phone]   Gu[place]ford Leep 03/03/2024, 3:41 PM

## 2024-03-03 NOTE — Consult Note (Addendum)
 Cardiology Consultation   Patient ID: Mandy Serrano MRN: [id number]; DOB: 1976/01/15  Admit date: 03/02/2024 Date of Consult: 03/03/2024  PCP:  Vicente Graham, No   [facility] Providers Cardiologist:  None  Electrophysiologist:  Will Cortland Ding, MD  {    Patient Profile:   Mandy Serrano is a 48 y.o. female with a hx of HTN, obesity sickle cell anemia, HSa remote HF (2017 w/recovered LVEF), PVCs who is being seen 03/03/2024 for the evaluation of cardiac arrest/consideration for ICD at the request of Dr. Mitzie Anda.  History of Present Illness:   Mandy Serrano is followed by EP and HF team out patient.  Remote hx of systolic HF with recovered LVEF CMRI 05/2016 with no infiltrative/inflamatory process and EF ~25%. Had high PVC burden (16%) so she was started on amio. Amio eventually switch to flecainide  as EF had recovered. (Notes report historically offered/declined PVC ablation)  Echo 12/17: EF 55%  Echo 7/19 EF 60-65%. Bedside echo 04/2020 EF 60-65%. Cardiac Monitor 12/2021 > 6% PVC's   Saw the AHF team 01/02/24, doing well, no symptoms, started on Imdur  for better BP control with plans for an updated echo and if remained recovered, plans to graduate out of HF clinic  She saw EP APP 01/07/24, had been out of her medicines though resumed weeks prior to this appt with plans to establich in the HTN clinic  She was admitted overnight 5/11, with witness cardiac arrest EMS reviewed Family initiated CPR [employer] arrived and took over compressions G[facility] arrived, NPA and then IGel airway > [facility] arrived VF noted defibrillated "twice, one for VF and one for VT" EPi Defibrillated again Amio 300mg  Epi >> PEA EPi x 7 more doses NGT Epi ROSC  TOTAL time reported down time 52 minutes 911 call apparently at 2243 ROSC 00:10 > 1 hour  Only initial paddles rhythm for review c/w VF Intubated sedated here Amiodarone  gtt Multiple pressors Already today she has weaned off pressors and self  extubated Echo done today shows normal LVEF 55-60%, GIDD, RV normal. IVC normal w/ > 50% respiratory variability suggesting RAP ~3  AKI w/Creat 3.0 (baseline ~ 1) No STE. HS trop not c/w ACS (141>>706)  EP is asked on board  LABS K+ 3.9 > 6.3 > 5.4 > 4.9 > 4.6 >> 3.6 BUN/Creat 26/1.57 > 1.90 > 2.26 > 2.56 > 3.02 Mag 2.0 > 1.6 AST 93 ALT 141 HS Trop 141 > 706 > 165 Lactic acid 8.9 > 1.3  CBC today WBC > 27.5 > 23.8 >  down to 11.3 H/H 11.2/34 Plts 193  Remains on amiodarone  gtt VSS  Patient's daughter is bedside Pt is awake, alert, though daughter helps filling gaps. No preceding symptoms reported, no CP, SOB    Past Medical History:  Diagnosis Date   Malignant hypertension     Past Surgical History:  Procedure Laterality Date   CESAREAN SECTION       Home Medications:  Prior to Admission medications   Medication Sig Start Date End Date Taking? Authorizing Provider  acetaminophen  (TYLENOL ) 500 MG tablet Take 500 mg by mouth every 6 (six) hours as needed for mild pain or headache.   Yes [provider]  amLODipine  (NORVASC ) 10 MG tablet Take 1 tablet (10 mg total) by mouth daily. 12/17/23 03/16/24 Yes [place], Arlice Bene, FNP  carvedilol  (COREG ) 12.5 MG tablet Take 1 tablet (12.5 mg total) by mouth 2 (two) times daily with a meal. 12/17/23  Yes [place], [place],  FNP  flecainide  (TAMBOCOR ) 100 MG tablet Take 1 tablet (100 mg total) by mouth 2 (two) times daily. *need appt* 12/17/23  Yes [place], Arlice Bene, FNP  hydrALAZINE  (APRESOLINE ) 100 MG tablet Take 1 tablet (100 mg total) by mouth 3 (three) times daily. 12/17/23  Yes [place], Arlice Bene, FNP  spironolactone  (ALDACTONE ) 25 MG tablet Take 1 tablet (25 mg total) by mouth daily. 12/17/23  Yes [place], Arlice Bene, FNP  ASPIR-LOW 81 MG EC tablet TAKE 1 TABLET BY MOUTH DAILY. 02/20/17   Darlis Eisenmenger, MD  Blood Pressure Monitoring (BLOOD PRESSURE CUFF) MISC Please check and monitor blood pressure as advised in the  clinic 12/17/23   Elmarie Hacking, FNP  isosorbide  mononitrate (IMDUR ) 30 MG 24 hr tablet Take 1 tablet (30 mg total) by mouth daily. 01/02/24   [place], Arlice Bene, FNP  losartan  (COZAAR ) 100 MG tablet Take 1 tablet (100 mg total) by mouth in the morning AND 1/2 tablet (50mg ) every evening. Pt needs to schedule appt with Dr. Lawana Pray for further refills - 3rd attempt. Patient not taking: Reported on 03/02/2024 12/17/23   Elmarie Hacking, FNP    Inpatient Medications: Scheduled Meds:  aspirin   81 mg Per Tube Daily   Chlorhexidine Gluconate Cloth  6 each Topical Daily   docusate  100 mg Per Tube BID   feeding supplement (PROSource TF20)  60 mL Per Tube BID   feeding supplement (VITAL HIGH PROTEIN)  1,000 mL Per Tube Q24H   heparin   5,000 Units Subcutaneous Q8H   insulin aspart  0-15 Units Subcutaneous Q4H   mouth rinse  15 mL Mouth Rinse Q2H   pantoprazole (PROTONIX) IV  40 mg Intravenous Q24H   polyethylene glycol  17 g Per Tube Daily   Continuous Infusions:  amiodarone  30 mg/hr (03/03/24 1412)   ampicillin-sulbactam (UNASYN) IV     feeding supplement (VITAL 1.5 CAL)     norepinephrine (LEVOPHED) Adult infusion Stopped (03/03/24 0908)   PRN Meds: ipratropium-albuterol, ondansetron  (ZOFRAN ) IV, mouth rinse, polyethylene glycol  Allergies:   No Known Allergies  Social History:   Social History   Socioeconomic History   Marital status: Married    Spouse name: Not on file   Number of children: Not on file   Years of education: Not on file   Highest education level: Not on file  Occupational History   Not on file  Tobacco Use   Smoking status: Never    Passive exposure: Past   Smokeless tobacco: Never  Substance and Sexual Activity   Alcohol use: No   Drug use: No   Sexual activity: Not on file  Other Topics Concern   Not on file  Social History Narrative   Not on file   Social Drivers of Health   Financial Resource Strain: Medium Risk (12/17/2023)   Overall  Financial Resource Strain (CARDIA)    Difficulty of Paying Living Expenses: Somewhat hard  Food Insecurity: Not on file  Transportation Needs: Not on file  Physical Activity: Not on file  Stress: Not on file  Social Connections: Not on file  Intimate Partner Violence: Not on file    Family History:   Family History  Problem Relation Age of Onset   Hypertension Mother    Hypertension Father      ROS:  Please see the history of present illness.  All other ROS reviewed and negative.     Physical Exam/Data:   Vitals:   03/03/24 0900 03/03/24 1000 03/03/24  1100 03/03/24 1200  BP: (!) 143/88 130/84 133/74 131/76  Pulse: 91 84 80 72  Resp: 17 15 16 17   Temp: 99 F (37.2 C) 98.8 F (37.1 C) 98.6 F (37 C) 98.4 F (36.9 C)  TempSrc:      SpO2: 100% 100% 100% 100%  Weight:      Height:        Intake/Output Summary (Last 24 hours) at 03/03/2024 1502 Last data filed at 03/03/2024 1154 Gross per 24 hour  Intake 3002.32 ml  Output 1817 ml  Net 1185.32 ml      03/02/2024    2:30 AM 01/07/2024    9:19 AM 01/02/2024    3:25 PM  Last 3 Weights  Weight (lbs) 223 lb 15.8 oz 224 lb 224 lb 12.8 oz  Weight (kg) 101.6 kg 101.606 kg 101.969 kg     Body mass index is 38.43 kg/m.  General:  Well nourished, well developed, in no acute distress HEENT: normal Neck: no JVD Vascular: No carotid bruits; Distal pulses 2+ bilaterally Cardiac:  RRR; no murmurs, gallops or rubs Lungs: CTA b/l, no wheezing, rhonchi or rales  Abd: soft, nontender Ext: no edema Musculoskeletal:  No deformities Skin: warm and dry  Neuro:  no gross focal abnormalities noted Psych:  Normal affect   EKG:  The EKG was personally reviewed and demonstrates:    SR 61bpm, RBBB, LAD, PR 226, QRS 123, QTc 429  OLD 01/07/24 SR 70bpm, LAD, PVCs, PR , QRS , QTc  Telemetry:  Telemetry was personally reviewed and demonstrates:   SR 70's mostly, intermittently she has more frequent PVCs No coupling,  no VT  Relevant CV Studies:  Echo 03/03/24   1. Left ventricular ejection fraction, by estimation, is 55 to 60%. The  left ventricle has normal function. The left ventricle has no regional  wall motion abnormalities. There is moderate concentric left ventricular  hypertrophy. Left ventricular  diastolic parameters are consistent with Grade I diastolic dysfunction  (impaired relaxation).   2. Right ventricular systolic function is normal. The right ventricular  size is normal. Tricuspid regurgitation signal is inadequate for assessing  PA pressure.   3. The mitral valve is normal in structure. No evidence of mitral valve  regurgitation. No evidence of mitral stenosis.   4. The aortic valve is tricuspid. Aortic valve regurgitation is trivial.   5. The inferior vena cava is normal in size with greater than 50%  respiratory variability, suggesting right atrial pressure of 3 mmHg.     Laboratory Data:  High Sensitivity Troponin:   Recent Labs  Lab 03/02/24 0036 03/02/24 0359 03/03/24 0437  TROPONINIHS 141* 706* 165*     Chemistry Recent Labs  Lab 03/02/24 0359 03/02/24 0610 03/02/24 0955 03/02/24 1725 03/03/24 0437  NA 137   < > 135 138 137  K 5.4*   < > 4.6 4.1 3.6  CL 105  --  103 105 104  CO2 20*  --  17* 22 24  GLUCOSE 138*  --  188* 158* 192*  BUN 34*  --  40* 41* 48*  CREATININE 1.90*  1.91*  --  2.26* 2.56* 3.02*  CALCIUM 8.7*  --  8.6* 8.3* 8.0*  MG 2.0  --   --   --  1.6*  GFRNONAA 32*  32*  --  26* 22* 18*  ANIONGAP 12  --  15 11 9    < > = values in this interval not displayed.  Recent Labs  Lab 03/02/24 0036 03/03/24 0437  PROT 6.5 5.7*  ALBUMIN 3.0* 2.5*  AST 252* 93*  ALT 209* 141*  ALKPHOS 66 46  BILITOT 0.6 0.3   Lipids  Recent Labs  Lab 03/03/24 0437  TRIG 110    Hematology Recent Labs  Lab 03/02/24 0036 03/02/24 0115 03/02/24 0359 03/02/24 0610 03/03/24 0437  WBC 27.5*  --  23.8*  --  11.3*  RBC 3.99  --  4.06  --  3.75*   HGB 12.1   < > 12.3 12.6 11.2*  HCT 40.1   < > 39.0 37.0 34.9*  MCV 100.5*  --  96.1  --  93.1  MCH 30.3  --  30.3  --  29.9  MCHC 30.2  --  31.5  --  32.1  RDW 14.2  --  14.3  --  14.7  PLT 281  --  320  --  193   < > = values in this interval not displayed.   Thyroid  No results for input(s): "TSH", "FREET4" in the last 168 hours.  BNP Recent Labs  Lab 03/02/24 0359  BNP 137.1*    DDimer No results for input(s): "DDIMER" in the last 168 hours.   Radiology/Studies:    CT Head Wo Contrast Result Date: 03/02/2024 CLINICAL DATA:  Cardiac arrest EXAM: CT HEAD WITHOUT CONTRAST TECHNIQUE: Contiguous axial images were obtained from the base of the skull through the vertex without intravenous contrast. RADIATION DOSE REDUCTION: This exam was performed according to the departmental dose-optimization program which includes automated exposure control, adjustment of the mA and/or kV according to patient size and/or use of iterative reconstruction technique. COMPARISON:  None Available. FINDINGS: Brain: No evidence of acute infarction, hemorrhage, hydrocephalus, extra-axial collection or mass lesion/mass effect. Mild white matter low-density with patchy appearance, likely premature small vessel disease related to chart history of malignant hypertension. Vascular: No hyperdense vessel or unexpected calcification. Skull: Negative Sinuses/Orbits: Generalized mucosal thickening and congested appearance of nasal mucosa. Symmetric proptosis without retro-orbital pathology seen. IMPRESSION: No acute finding.  No visible global anoxic injury. Mild white matter disease likely related to history hypertension. Electronically Signed   By: Ronnette Coke M.D.   On: 03/02/2024 06:04   DG Chest Portable 1 View Result Date: 03/02/2024 CLINICAL DATA:  Central line placement EXAM: PORTABLE CHEST 1 VIEW COMPARISON:  Earlier today FINDINGS: Left IJ line with tip at the upper cavoatrial junction. No pneumothorax.  Endotracheal tube tip between the clavicular heads and carina. Enteric tube reaches the stomach. Hazy bilateral perihilar to apical opacification. No effusion or pneumothorax seen. Marked cardiopericardial enlargement. IMPRESSION: New central line without complicating features. Stable aeration. Electronically Signed   By: Ronnette Coke M.D.   On: 03/02/2024 05:01   DG Chest Portable 1 View Result Date: 03/02/2024 CLINICAL DATA:  Intubation post arrest EXAM: PORTABLE CHEST 1 VIEW COMPARISON:  Chest x-ray 06/13/2016 FINDINGS: Endotracheal tube with tip terminating approximately 2-3 cm above the carina. Enteric tube courses below the hemidiaphragm with tip overlying the expected region the gastric lumen. Side port not visualized. Cardiac paddles overlie the chest. The heart and mediastinal contours are unchanged. Biapical interstitial airspace opacities. No pleural effusion. No pneumothorax. No acute osseous abnormality. IMPRESSION: 1. Biapical interstitial airspace opacities. Finding could represent edema versus infection. 2.  Lines and tubes as above. Electronically Signed   By: Morgane  Naveau M.D.   On: 03/02/2024 01:04     Assessment and Plan:   Cardiac arrest Uncertain etiology  LVEF remains preserved Historically c.MRI was normal (done for CM and PVCs) Would consider cath/coronary evaluation once renal function allows  Anticipate ICD implant prior to discharge  Further with CCM/HF teams    Risk Assessment/Risk Scores:    For questions or updates, please contact [facility] Please consult [email or web address] for contact info under    Signed, Debbie Fails, PA-C  03/03/2024 3:02 PM  I have seen, examined the patient, and reviewed the above assessment and plan.    HPI: Ms. 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  Rhythm upon EMS  arrival was reportedly ventricular fibrillation.  Patient had prolonged downtime but ROSC was able to be achieved.  She is now status post extubation.  She is interacting but sluggish to respond and fatigued.  Her daughter is at bedside.  Patient was reportedly in normal health prior to her arrest with no new or acute problems.  General: Well developed, in no acute distress.  Neck: No JVD.  Cardiac: Normal rate, irregular rhythm.  Resp: Normal work of breathing.  Ext: No edema.  Neuro: No gross focal deficits.  Psych: Normal affect.   Assessment: Mandy Serrano is a 48 year old female who presented to the ED following a resuscitated cardiac arrest.  Unclear what initial presenting rhythm was, but there are some reports of VT/VF requiring shocks.  No etiology of her arrest has been identified.  Patient has a history of nonischemic cardiomyopathy which was attributed to PVCs.  EF returned to normal with PVC suppression.  She had been on flecainide  chronically and was taking this medication at time of her arrest.  She has since been transitioned to amiodarone .  She continues to have PVCs despite amiodarone ; however burden appears to be less than 20%.  Plan:  - Continue IV amiodarone  for now.  This will be transitioned to oral amiodarone  near discharge. - Assessment of coronary anatomy to exclude ischemia or anomalous coronary as etiology.  Coronary CTA would likely be sufficient; however, patient's creatinine is uptrending so will need to be delayed until kidney function has recovered. - If initial rhythm was VF, then patient would meet criteria for secondary prevention ICD implant.  Discussed with patient and her daughter.  Ardeen Kohler, MD 03/03/2024 10:46 PM

## 2024-03-03 NOTE — Progress Notes (Signed)
 PCCM Interval Note  post extubation, pt had additional emesis, husband states was after coughing spell, c/w post tussive emesis.  Prior to extubation, OGT placed to suction with minimal bilious output Small amount of bilious emesis and small amount of  bright red blood (possible trauma from extubation).  Pt did have short self resolved bigeminy afterwards.     - cont NPO - zofran  prn  - aspiration precautions - recheck Mag at 1700      Early Glisson, MSN, AG-ACNP-BC [facility] 03/03/2024, 10:14 AM  See Amion for pager If no response to pager , please call [phone] until 7pm After 7:00 pm call Elink  [phone]

## 2024-03-03 NOTE — Progress Notes (Signed)
 PHARMACY NOTE:  ANTIMICROBIAL RENAL DOSAGE ADJUSTMENT  Current antimicrobial regimen includes a mismatch between antimicrobial dosage and estimated renal function.  As per policy approved by the Pharmacy & Therapeutics and Medical Executive Committees, the antimicrobial dosage will be adjusted accordingly.  Current antimicrobial dosage:  Ampicillin-sulbactam IV 3 g q6h   Indication: possible aspiration PNA   Renal Function: CrCl = 26.1  Estimated Creatinine Clearance: 26.1 mL/min (A) (by C-G formula based on SCr of 3.02 mg/dL (H)). []      On intermittent HD, scheduled: []      On CRRT    Antimicrobial dosage has been changed to:  Ampicillin-sulbactam IV 3 g q12h  Last dose was 5/13 21:00, will keep this stop date when updating frequency    Thank you for allowing pharmacy to be a part of this patient's care.  Patience Bonito, [facility] 03/03/2024 8:15 AM

## 2024-03-03 NOTE — Progress Notes (Signed)
 Return Mag 1.1, previously 1.6 and received 2gm Mag replete.  Unclear why.  UOP 1L since shift change, no further emesis, no diarrhea.     - Recheck Mag for verification.  Is having some PVC's which are new from this am, will check BMET as well.         Early Glisson, MSN, AG-ACNP-BC [facility] 03/03/2024, 5:58 PM  See Amion for pager If no response to pager , please call [phone] until 7pm After 7:00 pm call Elink  [phone]

## 2024-03-03 NOTE — Consult Note (Signed)
 Advanced Heart Failure Team Consult Note   Primary Physician: Pcp, No Cardiologist:  Dr. Julane Ny   Reason for Consultation: Post OOH Cardiac Arrest   HPI:    Mandy Serrano is seen today for evaluation, post OOH Cardiac Arrest, at the request of Dr. Marene Shape, CCM  Ms Brunetti is a 48 y.o. with history of obesity, HTN  was on 5 anti-HTN medicines in [place]), systolic heart failure 05/2016, headaches, and frequent PVCs.   Admitted 8/17 with increased dyspnea and HTN crisis. She had not been taking any medications over the last last year. New acute systolic heart identified on ECHO. LVEF ~25%. CMRI no infiltrative/inflamatory process and EF ~25%. Had high PVC burden (16%) so she was started on amio. Amio eventually switch to flecainide  as EF recovered.    Unable to obtain Entresto  because she is [place]  citizen.    Echo 7/19 EF 60-65%.   Bedside echo 04/2020 EF 60-65%.  Graduated from AHF clinic 05/2022, and referred to HTN clinic.   Ended up coming back to the [facility] and recently seen 2/25. Had been off most meds and BP was uncontrolled. She was restarted on GDMT with plans to repeat echo to ensure EF was still stable, however pt had not completed this yet.   Pt BIEMS on 5/11 after witnessed cardiac arrest. Total downtime prior to initiation of CPR was approximately 12 min. Initially was PEA but then developed VT and VF which was treated w/ defibrillation. Required multiple rounds of epi and started on amio gtt. Per report total duration of CPR was 52 min before ROSC. Admitted to MICU. On multiple pressors. HS trop 141>>706. Initial K 6.3. Mg 2.0. Post arrest QTC 429 ms. Fortunately, she had neurological recovery. Weaned off all pressors and self extubated today. Echo done today shows normal LVEF 55-60%, GIDD, RV normal. IVC normal w/ > 50% respiratory variability suggesting RAP ~3.   She continues on amio gtt. No further arrhthymias on tele. As noted above, she is now off pressors. SBPs  running in the low 100s-110s this morning. Now starting to increase into the 130s systolic.   She has an AKI, SCr 3.02 today. Nonoliguric. Had 2L in UOP yesterday.    Echo 03/03/24  1. Left ventricular ejection fraction, by estimation, is 55 to 60%. The  left ventricle has normal function. The left ventricle has no regional  wall motion abnormalities. There is moderate concentric left ventricular  hypertrophy. Left ventricular  diastolic parameters are consistent with Grade I diastolic dysfunction  (impaired relaxation).   2. Right ventricular systolic function is normal. The right ventricular  size is normal. Tricuspid regurgitation signal is inadequate for assessing  PA pressure.   3. The mitral valve is normal in structure. No evidence of mitral valve  regurgitation. No evidence of mitral stenosis.   4. The aortic valve is tricuspid. Aortic valve regurgitation is trivial.   5. The inferior vena cava is normal in size with greater than 50%  respiratory variability, suggesting right atrial pressure of 3 mmHg.    Home Medications Prior to Admission medications   Medication Sig Start Date End Date Taking? Authorizing Provider  acetaminophen  (TYLENOL ) 500 MG tablet Take 500 mg by mouth every 6 (six) hours as needed for mild pain or headache.   Yes [provider]  amLODipine  (NORVASC ) 10 MG tablet Take 1 tablet (10 mg total) by mouth daily. 12/17/23 03/16/24 Yes Milford, Arlice Bene, FNP  carvedilol  (COREG ) 12.5 MG tablet Take 1  tablet (12.5 mg total) by mouth 2 (two) times daily with a meal. 12/17/23  Yes Milford, Arlice Bene, FNP  flecainide  (TAMBOCOR ) 100 MG tablet Take 1 tablet (100 mg total) by mouth 2 (two) times daily. *need appt* 12/17/23  Yes Milford, Arlice Bene, FNP  hydrALAZINE  (APRESOLINE ) 100 MG tablet Take 1 tablet (100 mg total) by mouth 3 (three) times daily. 12/17/23  Yes Milford, Arlice Bene, FNP  spironolactone  (ALDACTONE ) 25 MG tablet Take 1 tablet (25 mg total) by mouth  daily. 12/17/23  Yes Milford, Arlice Bene, FNP  ASPIR-LOW 81 MG EC tablet TAKE 1 TABLET BY MOUTH DAILY. 02/20/17   Darlis Eisenmenger, MD  Blood Pressure Monitoring (BLOOD PRESSURE CUFF) MISC Please check and monitor blood pressure as advised in the clinic 12/17/23   Elmarie Hacking, FNP  isosorbide  mononitrate (IMDUR ) 30 MG 24 hr tablet Take 1 tablet (30 mg total) by mouth daily. 01/02/24   Milford, Arlice Bene, FNP  losartan  (COZAAR ) 100 MG tablet Take 1 tablet (100 mg total) by mouth in the morning AND 1/2 tablet (50mg ) every evening. Pt needs to schedule appt with Dr. Lawana Pray for further refills - 3rd attempt. Patient not taking: Reported on 03/02/2024 12/17/23   Elmarie Hacking, FNP    Past Medical History: Past Medical History:  Diagnosis Date   Malignant hypertension     Past Surgical History: Past Surgical History:  Procedure Laterality Date   CESAREAN SECTION      Family History: Family History  Problem Relation Age of Onset   Hypertension Mother    Hypertension Father     Social History: Social History   Socioeconomic History   Marital status: Married    Spouse name: Not on file   Number of children: Not on file   Years of education: Not on file   Highest education level: Not on file  Occupational History   Not on file  Tobacco Use   Smoking status: Never    Passive exposure: Past   Smokeless tobacco: Never  Substance and Sexual Activity   Alcohol use: No   Drug use: No   Sexual activity: Not on file  Other Topics Concern   Not on file  Social History Narrative   Not on file   Social Drivers of Health   Financial Resource Strain: Medium Risk (12/17/2023)   Overall Financial Resource Strain (CARDIA)    Difficulty of Paying Living Expenses: Somewhat hard  Food Insecurity: Not on file  Transportation Needs: Not on file  Physical Activity: Not on file  Stress: Not on file  Social Connections: Not on file    Allergies:  No Known Allergies  Objective:     Vital Signs:   Temp:  [98.4 F (36.9 C)-99.1 F (37.3 C)] 98.4 F (36.9 C) (05/12 1200) Pulse Rate:  [67-91] 72 (05/12 1200) Resp:  [0-25] 17 (05/12 1200) BP: (76-144)/(51-97) 131/76 (05/12 1200) SpO2:  [100 %] 100 % (05/12 1200) Arterial Line BP: (97-110)/(81-94) 105/86 (05/11 1715) FiO2 (%):  [40 %-60 %] 40 % (05/12 0809) Last BM Date : 03/02/24  Weight change: Filed Weights   03/02/24 0230  Weight: 101.6 kg    Intake/Output:   Intake/Output Summary (Last 24 hours) at 03/03/2024 1237 Last data filed at 03/03/2024 1154 Gross per 24 hour  Intake 3378.69 ml  Output 1817 ml  Net 1561.69 ml      Physical Exam    General:  fatigued appearing. No resp difficulty HEENT: normal Neck: supple.  JVP not elevated . [name] Cor: PMI nondisplaced. Regular rate & rhythm. No rubs, gallops or murmurs. Lungs: clear Abdomen: soft, nontender, nondistended.  Extremities: no cyanosis, clubbing, rash, edema Neuro: alert & orientedx3, cranial nerves grossly intact. moves all 4 extremities w/o difficulty. Affect pleasant   Telemetry   NSR 60s with occasional PVCs (personally reviewed)  EKG    NSR 61 bpm, RBBB and LAFB, QTC 429 ms (personally reviewed)  Labs   Basic Metabolic Panel: Recent Labs  Lab 03/02/24 0036 03/02/24 0115 03/02/24 0359 03/02/24 0610 03/02/24 0955 03/02/24 1725 03/03/24 0437  NA 135   < > 137 135 135 138 137  K 3.9   < > 5.4* 4.9 4.6 4.1 3.6  CL 102  --  105  --  103 105 104  CO2 16*  --  20*  --  17* 22 24  GLUCOSE 324*  --  138*  --  188* 158* 192*  BUN 26*  --  34*  --  40* 41* 48*  CREATININE 1.57*  --  1.90*  1.91*  --  2.26* 2.56* 3.02*  CALCIUM 9.3  --  8.7*  --  8.6* 8.3* 8.0*  MG  --   --  2.0  --   --   --  1.6*  PHOS  --   --  8.6*  --   --   --  3.1   < > = values in this interval not displayed.    Liver Function Tests: Recent Labs  Lab 03/02/24 0036 03/03/24 0437  AST 252* 93*  ALT 209* 141*  ALKPHOS 66 46  BILITOT 0.6 0.3  PROT  6.5 5.7*  ALBUMIN 3.0* 2.5*   No results for input(s): "LIPASE", "AMYLASE" in the last 168 hours. No results for input(s): "AMMONIA" in the last 168 hours.  CBC: Recent Labs  Lab 03/02/24 0036 03/02/24 0115 03/02/24 0359 03/02/24 0610 03/03/24 0437  WBC 27.5*  --  23.8*  --  11.3*  NEUTROABS 15.7*  --   --   --   --   HGB 12.1 13.3 12.3 12.6 11.2*  HCT 40.1 39.0 39.0 37.0 34.9*  MCV 100.5*  --  96.1  --  93.1  PLT 281  --  320  --  193    Cardiac Enzymes: No results for input(s): "CKTOTAL", "CKMB", "CKMBINDEX", "TROPONINI" in the last 168 hours.  BNP: BNP (last 3 results) Recent Labs    12/17/23 1131 01/02/24 1559 03/02/24 0359  BNP 133.3* 19.0 137.1*    ProBNP (last 3 results) No results for input(s): "PROBNP" in the last 8760 hours.   CBG: Recent Labs  Lab 03/02/24 1904 03/02/24 2348 03/03/24 0326 03/03/24 0809 03/03/24 1143  GLUCAP 140* 132* 142* 145* 105*    Coagulation Studies: No results for input(s): "LABPROT", "INR" in the last 72 hours.   Imaging   ECHOCARDIOGRAM COMPLETE Result Date: 03/03/2024    ECHOCARDIOGRAM REPORT   Patient Name:   MARIELLY SPINALE Date of Exam: 03/03/2024 Medical Rec #:  [id number]     Height:       64.0 in Accession #:    [id number]    Weight:       224.0 lb Date of Birth:  April 11, 1976     BSA:          2.053 m Patient Age:    47 years      BP:           145/81 mmHg Patient Gender:  F             HR:           80 bpm. Exam Location:  Inpatient Procedure: 2D Echo, Cardiac Doppler, Color Doppler and Intracardiac            Opacification Agent (Both Spectral and Color Flow Doppler were            utilized during procedure). Indications:    Cardiac Arrest I46.9  History:        Patient has prior history of Echocardiogram examinations, most                 recent 10/11/2016.  Sonographer:    Hersey Lorenzo RDCS Referring Phys: [id number] Cran[place] Dk IMPRESSIONS  1. Left ventricular ejection fraction, by estimation, is 55 to 60%. The left  ventricle has normal function. The left ventricle has no regional wall motion abnormalities. There is moderate concentric left ventricular hypertrophy. Left ventricular diastolic parameters are consistent with Grade I diastolic dysfunction (impaired relaxation).  2. Right ventricular systolic function is normal. The right ventricular size is normal. Tricuspid regurgitation signal is inadequate for assessing PA pressure.  3. The mitral valve is normal in structure. No evidence of mitral valve regurgitation. No evidence of mitral stenosis.  4. The aortic valve is tricuspid. Aortic valve regurgitation is trivial.  5. The inferior vena cava is normal in size with greater than 50% respiratory variability, suggesting right atrial pressure of 3 mmHg. FINDINGS  Left Ventricle: Left ventricular ejection fraction, by estimation, is 55 to 60%. The left ventricle has normal function. The left ventricle has no regional wall motion abnormalities. Definity contrast agent was given IV to delineate the left ventricular  endocardial borders. The left ventricular internal cavity size was normal in size. There is moderate concentric left ventricular hypertrophy. Left ventricular diastolic parameters are consistent with Grade I diastolic dysfunction (impaired relaxation). Right Ventricle: The right ventricular size is normal. No increase in right ventricular wall thickness. Right ventricular systolic function is normal. Tricuspid regurgitation signal is inadequate for assessing PA pressure. Left Atrium: Left atrial size was normal in size. Right Atrium: Right atrial size was normal in size. Pericardium: There is no evidence of pericardial effusion. Mitral Valve: The mitral valve is normal in structure. No evidence of mitral valve regurgitation. No evidence of mitral valve stenosis. Tricuspid Valve: The tricuspid valve is normal in structure. Tricuspid valve regurgitation is not demonstrated. Aortic Valve: The aortic valve is tricuspid.  Aortic valve regurgitation is trivial. Pulmonic Valve: The pulmonic valve was normal in structure. Pulmonic valve regurgitation is not visualized. Aorta: The aortic root is normal in size and structure. Venous: The inferior vena cava is normal in size with greater than 50% respiratory variability, suggesting right atrial pressure of 3 mmHg. IAS/Shunts: No atrial level shunt detected by color flow Doppler.  LEFT VENTRICLE PLAX 2D LVIDd:         4.90 cm   Diastology LVIDs:         3.10 cm   LV e' medial:    5.11 cm/s LV PW:         1.40 cm   LV E/e' medial:  13.1 LV IVS:        1.40 cm   LV e' lateral:   8.38 cm/s LVOT diam:     2.30 cm   LV E/e' lateral: 8.0 LV SV:         78 LV SV Index:   38  LVOT Area:     4.15 cm  IVC IVC diam: 1.60 cm LEFT ATRIUM           Index LA diam:      3.70 cm 1.80 cm/m LA Vol (A4C): 47.3 ml 23.04 ml/m  AORTIC VALVE LVOT Vmax:   98.70 cm/s LVOT Vmean:  59.400 cm/s LVOT VTI:    0.187 m  AORTA Ao Root diam: 3.00 cm Ao Asc diam:  3.10 cm MITRAL VALVE MV Area (PHT): 3.50 cm    SHUNTS MV Decel Time: 217 msec    Systemic VTI:  0.19 m MV E velocity: 67.10 cm/s  Systemic Diam: 2.30 cm MV A velocity: 80.70 cm/s MV E/A ratio:  0.83 Tiran Sauseda McleanMD Electronically signed by Archer Bear Signature Date/Time: 03/03/2024/11:28:52 AM    Final    EEG adult Result Date: 03/03/2024 Eleni Griffin, MD     03/03/2024  7:12 AM Routine EEG Report Bridney Aboulhosn is a 48 y.o. female with a history of cardiac arrest who is undergoing an EEG to evaluate for seizures. Report: This EEG was acquired with electrodes placed according to the International 10-20 electrode system (including Fp1, Fp2, F3, F4, C3, C4, P3, P4, O1, O2, T3, T4, T5, T6, A1, A2, Fz, Cz, Pz). The following electrodes were missing or displaced: none. There was no clear waking rhythm. Background consists of burst suppression pattern with bursts containing theta-delta activity of 4-6 Hz. This activity is reactive to stimulation. No sleep  architecture identified. There was no focal slowing. There were no interictal epileptiform discharges. There were no electrographic seizures identified. Photic stimulation and hyperventilation not performed. Impression and clinical correlation: This EEG was obtained while sedated and is abnormal due to burst suppression pattern indicative of global cerebral dysfunction, medication effect or both. Epileptiform abnormalities were not seen during this recording. Greg Leaks, MD [facility] [phone] If 7pm- 7am, please page neurology on call as listed in AMION.     Medications:     Current Medications:  aspirin   81 mg Per Tube Daily   Chlorhexidine Gluconate Cloth  6 each Topical Daily   docusate  100 mg Per Tube BID   feeding supplement (PROSource TF20)  60 mL Per Tube BID   feeding supplement (VITAL HIGH PROTEIN)  1,000 mL Per Tube Q24H   heparin   5,000 Units Subcutaneous Q8H   insulin aspart  0-15 Units Subcutaneous Q4H   mouth rinse  15 mL Mouth Rinse Q2H   pantoprazole (PROTONIX) IV  40 mg Intravenous Q24H   polyethylene glycol  17 g Per Tube Daily    Infusions:  amiodarone  30 mg/hr (03/03/24 1200)   ampicillin-sulbactam (UNASYN) IV     feeding supplement (VITAL 1.5 CAL)     norepinephrine (LEVOPHED) Adult infusion Stopped (03/03/24 0908)      Patient Profile   48 y.o. with history of systolic heart failure in 2017, felt PVC mediated. PVCs suppressed w/ Flecainide  and EF improved/normalized. Also w/ HTN, sickle cell anemia, admitted post OOH Cardiac Arrest.   Assessment/Plan   1. OOH Cardiac Arrest - witnessed. Initially PEA, then VT/VF treated w/ Defib, Epi + amio  - [place] prior to start of CPR 12 min. Total duration of CPR prior to ROSC was 52 min.  - Had been on Flecanide + Coreg  for PVC suppression  - initial K 6.3, suspect trigger. Mg 2.0. Post arrest QTc 429 ms. No STE. HS trop not c/w ACS (141>>706) - Post arrest Echo EF 55-60%, RV nl  - +  neurologic recovery. Now off pressors and extubated. Hyperkalemia corrected - continue IV amiodarone  - Flecainide  discontinued - consult EP   2. HFpEF  - h/o NICM; HTN vs PVC- induced.  - Echo 8/17: EF ~25-30%.  - cMRI no infiltrative/inflamatory process and EF ~25%.   - Echo 12/17: EF recovery, EF up to 55%.  - Echo 7/19: EF 60-65% - Echo this admit, EF 55-60%, RV normal. IVC not dilated. Assumed RAP ~3  - now stable off pressors. SBPs starting to trend up, currently 130s systolic  - no need for loop diuretics currently  - Holding Losartan  given AKI  - Monitor SBPs, if increase can use Imdur /hydral for afterload reduction, but would try to keep SBPs > 110 to help w/ renal perfusion given AKI   3. AKI, NonOliguric  - b/l SCr ~1. 1.6 on admission. Now at 3.0 - suspect 2/2 prolonged downtime from cardiac arrest  - monitor BP off pressors - monitor UOP - follow daily BMP   4. H/o Frequent PVCs - suppression imperative - off Flecainide  post arrest - continue amio gtt - EP consult requested   5. Hypomagnesia/Hypokalemia - Mg 1.6 today, K 3.6  - keep Mg > 2.0 and K > 4.0 - IV Mg supp ordered   6. Acute Hypoxic Respiratory Failure - intubated during arrest. Now extubated  - CCM covering for possible aspiration PNA  Length of Stay: 1  Brittainy Simmons, PA-C  03/03/2024, 12:37 PM  Patient seen with PA, I formulated the plan and agree with the above note.   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  Cardiomyopathy was thought to be due to HTN +/- PVCs, she has been on flecainide  for suppression of PVCs.    She was in her usual state of health until last night.  Noted by family to start shaking and fall to the ground unconscious.  She than had CPR initially by family and later by EMS, 52 min to ROSC.  Rhythm appeared to be PEA when EMS arrived but she subsequently had VT and VF with shocks. She was intubated and initially on NE but  able to wean off.  She had AKI with creatinine up to 3.  She woke up and has been extubated.  SBP in 130s off pressors.  She is conversant.   I reviewed her echo that was done today, EF remains normal 55-60% with moderate LVH, normal RV, IVC normal.  She is now on amiodarone  gtt and is in NSR with PVCs.   General: NAD Neck: No JVD, no thyromegaly or thyroid  nodule.  Lungs: Clear to auscultation bilaterally with normal respiratory effort. CV: Nondisplaced PMI.  Heart regular S1/S2, no S3/S4, no murmur.  No peripheral edema.   Abdomen: Soft, nontender, no hepatosplenomegaly, no distention.  Skin: Intact without lesions or rashes.  Neurologic: Alert and oriented x 3.  Psych: Normal affect. Extremities: No clubbing or cyanosis.  HEENT: Normal.   1. Cardiac arrest: At least 52 minutes to ROSC.  Initial rhythm for EMS was PEA, but she was also noted to have VF and VT with shocks.  She has recovered surprisingly well, now extubated and conversant.  Echo today showed EF 55-60% with moderate LVH, normal RV, IVC normal.  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  ?VT as initial trigger for arrest.  - Continue amiodarone  gtt for now, eventually to po.  - EP consult to consider ICD.  2. HF with recovered EF: Echo today shows that EF remains normal, persistent recovery from prior nonischemic cardiomyopathy that was thought to be due to HTN +/- frequent PVCs.  LVH on echo c/w HTN.  Prior cardiac MRI with no delayed enhancement.  She is not volume overloaded on exam and IVC small on echo. - Continue PVC suppression.  - Keep BP controlled.  3. HTN: History of resistant HTN.  Has been off meds post-arrest but BP now trending up.   - With AKI, would stay off spironolactone  and losartan .  - Can restart hydralazine  and then Coreg  as needed as BP trends up.  4. AKI: Suspect ATN due to cardiac arrest/shock. Hopefully will begin to recover.  Avoid  hypotension.  5. PVCs: Long history of frequent PVCs, thought to contribute to prior cardiomyopathy.  She was initially on amiodarone  then switched to flecainide  when EF recovered. ?VT/VF as initial cause of current arrest, VT/VF seen during ACLS (though initial rhythm seen was PEA).  - Amiodarone  gtt for now.   Peder Bourdon 03/03/2024 1:32 PM   Advanced Heart Failure Team Pager [phone] (M-F; 7a - 5p)  Please contact [facility] for night-coverage after hours (4p -7a ) and weekends on [email or web address]

## 2024-03-03 NOTE — Plan of Care (Signed)
  Problem: Nutritional: Goal: Maintenance of adequate nutrition will improve Outcome: Progressing   Problem: Education: Goal: Knowledge of General Education information will improve Description: Including pain rating scale, medication(s)/side effects and non-pharmacologic comfort measures Outcome: Not Progressing   Problem: Clinical Measurements: Goal: Ability to maintain clinical measurements within normal limits will improve Outcome: Not Progressing Goal: Will remain free from infection Outcome: Not Progressing Goal: Diagnostic test results will improve Outcome: Not Progressing Goal: Respiratory complications will improve Outcome: Not Progressing Goal: Cardiovascular complication will be avoided Outcome: Not Progressing

## 2024-03-03 NOTE — Progress Notes (Signed)
  Echocardiogram 2D Echocardiogram has been performed.  Fain Home RDCS 03/03/2024, 11:19 AM

## 2024-03-03 NOTE — Progress Notes (Addendum)
 NAMECabrini Serrano, MRN:  [id number], DOB:  [birth year], LOS: 1 ADMISSION DATE:  03/02/2024, CONSULTATION DATE: 03/02/2024 REFERRING MD: , CHIEF COMPLAINT:  PEA arrest   History of Present Illness:  A 48 y.o. with history of obesity, HTN  was on 5 anti-HTN medicines in [place]), sickle cell anemia, systolic heart failure 05/2016, headaches, and frequent PVCs.   Admitted 8/17 with increased dyspnea and HTN crisis. She had not been taking any medications over the last last year. New acute systolic heart identified on ECHO. LVEF ~25%. CMRI no infiltrative/inflamatory process and EF ~25%. Had high PVC burden (16%) so she was started on amio. Amio eventually switch to flecainide  as EF recovered.    Unable to obtain Entresto  because she is not [place]  citizen.  Her most recent EF 60-65% and she was diagnosed with non-ischemic cardiomyopathy. Last seen in Cardiology/EP office 12/2023 for PVCs. She presented to ED after being found unresponsive and down time 12 min prior to CPR.  CPR started by PD at Mar 11, 2251.  She was coded for 52 min, mostly PEA with periods of V Tach and V fib.  She was shocked several times and she was given multiple rounds of Epinephrine IVP then started on Epinephrine drip in ED.  She is intubated and poorly responsive.  Family now at bedside.  Pertinent  Medical History  HTN, Non-ischemic Cardiomyopathy, CHFpEF   Significant Hospital Events: Including procedures, antibiotic start and stop dates in addition to other pertinent events   5/11: S/p PEA arrest . Intubated admit to ICU 5/11, 10 am: On Levophed 15 mcg/min, Vasopressin 0.03 unit/min, Propofol and Fentanyl for sedation. ABG 24/440/+8/85%. ABG 7.24/47/159/99%. Positive 2.7 L fluid balance in the last 24 hrs. On Amiodarone  drip. In sinus.   Interim History / Subjective:  NE 4 and weaning  Off sedation and on PSV, following commands.  Vomited small amount, no desaturations Afebrile sCr up 2.56> 3, UOP ok 2L/ 24hrs  Objective     Blood pressure 104/70, pulse 84, temperature 99 F (37.2 C), resp. rate 15, height 5' 4.02" (1.626 m), weight 101.6 kg, SpO2 100%.    Vent Mode: PSV;CPAP FiO2 (%):  [40 %-85 %] 40 % Set Rate:  [20 bmp] 20 bmp Vt Set:  [440 mL] 440 mL PEEP:  [8 cmH20] 8 cmH20 Pressure Support:  [10 cmH20] 10 cmH20 Plateau Pressure:  [10 cmH20-21 cmH20] 21 cmH20   Intake/Output Summary (Last 24 hours) at 03/03/2024 4098 Last data filed at 03/03/2024 0600 Gross per 24 hour  Intake 2897.95 ml  Output 1717 ml  Net 1180.95 ml   Filed Weights   03/02/24 0230  Weight: 101.6 kg    Examination: General:  critically ill obese female in bed in NAD on MV HEENT: MM pink/moist, ETT/ OGT, pupils 3/r Neuro: Awake, nods/ tracks, follows simple commands all extremities, slowed responses at times CV: rr, NSR, no murmur, L internal jugular CVL PULM:  non labored on PSV 5/5, good volumes, lungs clear, small amount of tan- prior bloody secretions, strong cough GI: soft, bs+, NT, foley- cyu Extremities: warm/dry, no pitting LE edema  Skin: no rashes   Labs> Na 137, K 3.6, BUN/ sCr 41/ 2.56> 48/ 3.02, glucose 192, mag 1.6, improving LFTs, WBC 23> 11.3, H/H 11.2/ 34.9  5/11 BCX> ngtd 5/11 MRSA PCR > neg 5/11 trach asp>  not sent  Resolved Hospital Problem list     Assessment & Plan:   Cardiac arrest, PEA and VT/VF with multiple shocks Shock-  cardiogenic/ septic, +/- sedation Type 2 elevated Trop rHFrEF/ NICM PVC's on flecanide HTN (on pta amlodipine , coreg , hydralazine , spiro, losartan , imdur ) Acute encephalopathy post cardiac arrest - CPR started 12 min after cardiac arrest, ROSC 52 min, s/p defibrillation treated with IV amiodarone  drip  - prior 2019 TTE> LVEF 60-65 - coox reassuring 71 on 5/11 P:  - cont to wean NE for MAP goal >65, off vaso 5/11 - afebrile, WBC improving - cont abx as below  - await echo - appreciate cardiology input - trop hs trending down 706> 165 - cont ASA - cont to hold  GDMT till off pressors - cont amio IV and hold pta flecainide .  Remains sinus, no ectopy on tele review - monitor QTc - optimize electrolytes - monitor volume status closely - net +3.6L, rising sCr but making good UOP - cont neuro protective measures/ avoid fever - pt is awake off sedation and following commands   Acute hypoxic hypercapnic respiratory failure Aspiration PNA - weaning well on PSV this am.  Small emesis suspected to pt coughing/ gaging, no desaturations and no change in WOB.  TF remains on hold> place to LIWS.  Likely extubate if still remains stable  - full MV support prn - VAP/ PPI  - PAD> fent/ propofol stopped  - trach asp not sent, cont aspiration coverage - prn BD - pulm hygiene, PT/ OT, SLP post extubation   AKI secondary to hypoperfusion as above HperK: resolved AGMA, resolved Hypomagnesemia  - stop bicarb gtt - good UOP despite increased sCr.  Hopefully will peak today.  No significant electrolyte derangements - mag replete for goal > 2 - likely d/c foley later today - check renal US  for completeness  - trend renal indices  - strict I/Os, daily wts - avoid nephrotoxins, renal dose meds, hemodynamic support as above   Elevated LFT- shock liver - improving, trend periodically   Hyperglycemia - check A1c - SSI prn   Class 2 obesity - diet/ outpt counseling when appropriate    Sickle cell anemia  - does not appear to be on any meds pta, cont supportive care   Best Practice (right click and "Reselect all SmartList Selections" daily)   Diet/type: tubefeeds> on hold pending extubation DVT prophylaxis prophylactic heparin   Pressure ulcer(s): N/A GI prophylaxis: H2B- renal adjusted Lines: Central line Foley:  Yes, and it is still needed Code Status:  full code Last date of multidisciplinary goals of care discussion []   Husband updated at bedside  Labs   CBC: Recent Labs  Lab 03/02/24 0036 03/02/24 0115 03/02/24 0359 03/02/24 0610  03/03/24 0437  WBC 27.5*  --  23.8*  --  11.3*  NEUTROABS 15.7*  --   --   --   --   HGB 12.1 13.3 12.3 12.6 11.2*  HCT 40.1 39.0 39.0 37.0 34.9*  MCV 100.5*  --  96.1  --  93.1  PLT 281  --  320  --  193    Basic Metabolic Panel: Recent Labs  Lab 03/02/24 0036 03/02/24 0115 03/02/24 0359 03/02/24 0610 03/02/24 0955 03/02/24 1725 03/03/24 0437  NA 135   < > 137 135 135 138 137  K 3.9   < > 5.4* 4.9 4.6 4.1 3.6  CL 102  --  105  --  103 105 104  CO2 16*  --  20*  --  17* 22 24  GLUCOSE 324*  --  138*  --  188* 158* 192*  BUN 26*  --  34*  --  40* 41* 48*  CREATININE 1.57*  --  1.90*  1.91*  --  2.26* 2.56* 3.02*  CALCIUM 9.3  --  8.7*  --  8.6* 8.3* 8.0*  MG  --   --  2.0  --   --   --  1.6*  PHOS  --   --  8.6*  --   --   --  3.1   < > = values in this interval not displayed.   GFR: Estimated Creatinine Clearance: 26.1 mL/min (A) (by C-G formula based on SCr of 3.02 mg/dL (H)). Recent Labs  Lab 03/02/24 0036 03/02/24 0044 03/02/24 0359 03/02/24 0955 03/03/24 0437  WBC 27.5*  --  23.8*  --  11.3*  LATICACIDVEN  --  8.9*  --  1.3  --     Liver Function Tests: Recent Labs  Lab 03/02/24 0036 03/03/24 0437  AST 252* 93*  ALT 209* 141*  ALKPHOS 66 46  BILITOT 0.6 0.3  PROT 6.5 5.7*  ALBUMIN 3.0* 2.5*   No results for input(s): "LIPASE", "AMYLASE" in the last 168 hours. No results for input(s): "AMMONIA" in the last 168 hours.  ABG    Component Value Date/Time   PHART 7.242 (L) 03/02/2024 0610   PCO2ART 47.0 03/02/2024 0610   PO2ART 159 (H) 03/02/2024 0610   HCO3 20.3 03/02/2024 0610   TCO2 22 03/02/2024 0610   ACIDBASEDEF 7.0 (H) 03/02/2024 0610   O2SAT 99 03/02/2024 0610     Coagulation Profile: No results for input(s): "INR", "PROTIME" in the last 168 hours.  Cardiac Enzymes: No results for input(s): "CKTOTAL", "CKMB", "CKMBINDEX", "TROPONINI" in the last 168 hours.  HbA1C: Hgb A1c MFr Bld  Date/Time Value Ref Range Status  12/17/2023  11:31 AM 5.6 4.8 - 5.6 % Final    Comment:    (NOTE) Pre diabetes:          5.7%-6.4%  Diabetes:              >6.4%  Glycemic control for   <7.0% adults with diabetes   09/19/2016 03:21 PM 5.5 <5.7 % Final    Comment:      For the purpose of screening for the presence of diabetes:   <5.7%       Consistent with the absence of diabetes 5.7-6.4 %   Consistent with increased risk for diabetes (prediabetes) >=6.5 %     Consistent with diabetes   This assay result is consistent with a decreased risk of diabetes.   Currently, no consensus exists regarding use of hemoglobin A1c for diagnosis of diabetes in children.   According to American Diabetes Association (ADA) guidelines, hemoglobin A1c <7.0% represents optimal control in non-pregnant diabetic patients. Different metrics may apply to specific patient populations. Standards of Medical Care in Diabetes (ADA).       CBG: Recent Labs  Lab 03/02/24 1526 03/02/24 1904 03/02/24 2348 03/03/24 0326 03/03/24 0809  GLUCAP 165* 140* 132* 142* 145*    Allergies No Known Allergies   Home Medications  Prior to Admission medications   Medication Sig Start Date End Date Taking? Authorizing Provider  acetaminophen  (TYLENOL ) 500 MG tablet Take 500 mg by mouth every 6 (six) hours as needed for mild pain or headache.    [provider]  amLODipine  (NORVASC ) 10 MG tablet Take 1 tablet (10 mg total) by mouth daily. 12/17/23 03/16/24  Elmarie Hacking, FNP  ASPIR-LOW 81 MG EC tablet TAKE 1 TABLET BY MOUTH  DAILY. 02/20/17   Darlis Eisenmenger, MD  Blood Pressure Monitoring (BLOOD PRESSURE CUFF) MISC Please check and monitor blood pressure as advised in the clinic 12/17/23   Elmarie Hacking, FNP  carvedilol  (COREG ) 12.5 MG tablet Take 1 tablet (12.5 mg total) by mouth 2 (two) times daily with a meal. 12/17/23   Milford, Arlice Bene, FNP  flecainide  (TAMBOCOR ) 100 MG tablet Take 1 tablet (100 mg total) by mouth 2 (two) times daily. *need  appt* 12/17/23   Elmarie Hacking, FNP  hydrALAZINE  (APRESOLINE ) 100 MG tablet Take 1 tablet (100 mg total) by mouth 3 (three) times daily. 12/17/23   Elmarie Hacking, FNP  isosorbide  mononitrate (IMDUR ) 30 MG 24 hr tablet Take 1 tablet (30 mg total) by mouth daily. 01/02/24   Milford, Arlice Bene, FNP  losartan  (COZAAR ) 100 MG tablet Take 1 tablet (100 mg total) by mouth in the morning AND 1/2 tablet (50mg ) every evening. Pt needs to schedule appt with Dr. Lawana Pray for further refills - 3rd attempt. 12/17/23   Elmarie Hacking, FNP  spironolactone  (ALDACTONE ) 25 MG tablet Take 1 tablet (25 mg total) by mouth daily. 12/17/23   Elmarie Hacking, FNP     Critical care time: 40 min         Early Glisson, MSN, AG-ACNP-BC [facility] 03/03/2024, 9:08 AM  See Amion for pager If no response to pager , please call [phone] until 7pm After 7:00 pm call Elink  [phone]

## 2024-03-04 DIAGNOSIS — K7401 Hepatic fibrosis, early fibrosis: Secondary | ICD-10-CM

## 2024-03-04 LAB — CBC
HCT: 31.9 % — ABNORMAL LOW (ref 36.0–46.0)
Hemoglobin: 10.4 g/dL — ABNORMAL LOW (ref 12.0–15.0)
MCH: 30.1 pg (ref 26.0–34.0)
MCHC: 32.6 g/dL (ref 30.0–36.0)
MCV: 92.5 fL (ref 80.0–100.0)
Platelets: 169 10*3/uL (ref 150–400)
RBC: 3.45 MIL/uL — ABNORMAL LOW (ref 3.87–5.11)
RDW: 15.1 % (ref 11.5–15.5)
WBC: 10.1 10*3/uL (ref 4.0–10.5)
nRBC: 0 % (ref 0.0–0.2)

## 2024-03-04 LAB — COMPREHENSIVE METABOLIC PANEL WITH GFR
ALT: 108 U/L — ABNORMAL HIGH (ref 0–44)
AST: 54 U/L — ABNORMAL HIGH (ref 15–41)
Albumin: 2.5 g/dL — ABNORMAL LOW (ref 3.5–5.0)
Alkaline Phosphatase: 41 U/L (ref 38–126)
Anion gap: 8 (ref 5–15)
BUN: 48 mg/dL — ABNORMAL HIGH (ref 6–20)
CO2: 24 mmol/L (ref 22–32)
Calcium: 7.8 mg/dL — ABNORMAL LOW (ref 8.9–10.3)
Chloride: 106 mmol/L (ref 98–111)
Creatinine, Ser: 3.2 mg/dL — ABNORMAL HIGH (ref 0.44–1.00)
GFR, Estimated: 17 mL/min — ABNORMAL LOW (ref >60)
Glucose, Bld: 101 mg/dL — ABNORMAL HIGH (ref 70–99)
Potassium: 3.9 mmol/L (ref 3.5–5.1)
Sodium: 138 mmol/L (ref 135–145)
Total Bilirubin: 0.4 mg/dL (ref 0.0–1.2)
Total Protein: 6 g/dL — ABNORMAL LOW (ref 6.5–8.1)

## 2024-03-04 LAB — GLUCOSE, CAPILLARY
Glucose-Capillary: 103 mg/dL — ABNORMAL HIGH (ref 70–99)
Glucose-Capillary: 106 mg/dL — ABNORMAL HIGH (ref 70–99)
Glucose-Capillary: 119 mg/dL — ABNORMAL HIGH (ref 70–99)
Glucose-Capillary: 122 mg/dL — ABNORMAL HIGH (ref 70–99)
Glucose-Capillary: 126 mg/dL — ABNORMAL HIGH (ref 70–99)
Glucose-Capillary: 311 mg/dL — ABNORMAL HIGH (ref 70–99)
Glucose-Capillary: 87 mg/dL (ref 70–99)
Glucose-Capillary: 93 mg/dL (ref 70–99)

## 2024-03-04 LAB — MAGNESIUM: Magnesium: 1.9 mg/dL (ref 1.7–2.4)

## 2024-03-04 MED ORDER — INSULIN REGULAR(HUMAN) IN NACL 100-0.9 UT/100ML-% IV SOLN
INTRAVENOUS | Status: DC
  Administered 2024-03-04: 5 [IU]/h via INTRAVENOUS
  Filled 2024-03-04: qty 100

## 2024-03-04 MED ORDER — INSULIN ASPART 100 UNIT/ML IJ SOLN: 3.0000 [IU] | Freq: Three times a day (TID) | INTRAMUSCULAR | Status: DC

## 2024-03-04 MED ORDER — INSULIN ASPART 100 UNIT/ML IJ SOLN
2.0000 [IU] | Freq: Three times a day (TID) | INTRAMUSCULAR | Status: DC
  Administered 2024-03-05 (×2): 2 [IU] via SUBCUTANEOUS
  Administered 2024-03-09: 4 [IU] via SUBCUTANEOUS
  Administered 2024-03-10 – 2024-03-12 (×3): 2 [IU] via SUBCUTANEOUS

## 2024-03-04 MED ORDER — DEXTROSE 50 % IV SOLN: 0.0000 mL | INTRAVENOUS | Status: DC | PRN

## 2024-03-04 MED ORDER — MAGNESIUM SULFATE IN D5W 1-5 GM/100ML-% IV SOLN
1.0000 g | Freq: Once | INTRAVENOUS | Status: AC
  Administered 2024-03-04: 1 g via INTRAVENOUS
  Filled 2024-03-04: qty 100

## 2024-03-04 MED ORDER — DOCUSATE SODIUM 100 MG PO CAPS
100.0000 mg | ORAL_CAPSULE | Freq: Two times a day (BID) | ORAL | Status: DC
  Administered 2024-03-04 – 2024-03-11 (×12): 100 mg via ORAL
  Filled 2024-03-04 (×16): qty 1

## 2024-03-04 MED ORDER — POTASSIUM CHLORIDE 20 MEQ PO PACK
20.0000 meq | PACK | Freq: Once | ORAL | Status: AC
  Administered 2024-03-04: 20 meq via ORAL
  Filled 2024-03-04: qty 1

## 2024-03-04 NOTE — Evaluation (Signed)
 Occupational Therapy Evaluation Patient Details Name: Mandy Serrano MRN: [id number] DOB: 1976/04/18 Today's Date: 03/04/2024   History of Present Illness   48 yo female admitted 5/11 after found down approximately prior to CPR of 52 minutes. Intubated 5/11-5/12. 5/12 EEG with global cerebral dysfunction. PMhx: HTN, CHF, obesity, PVC, sickle cell anemia.     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 Pt currently needing up to mod A for ADLs, CGA for bed mobility and CGA for transfers with RW. Pt noted to have impaired cognition, slow processing/responses, often needs interpretation [place]) from daughter. Pt presenting with impairments listed below, will follow acutely. Patient will benefit from intensive inpatient follow-up therapy, >3 hours/day to maximize safety/ind with ADL/functional mobility.      If plan is discharge home, recommend the following:   A little help with walking and/or transfers;A lot of help with bathing/dressing/bathroom;Assistance with cooking/housework;Direct supervision/assist for financial management;Direct supervision/assist for medications management;Assist for transportation;Help with stairs or ramp for entrance     Functional Status Assessment   Patient has had a recent decline in their functional status and demonstrates the ability to make significant improvements in function in a reasonable and predictable amount of time.     Equipment Recommendations   BSC/3in1;Tub/shower seat;Other (comment) (RW)     Recommendations for Other Services   PT consult     Precautions/Restrictions   Precautions Precautions: Fall Recall of Precautions/Restrictions: Impaired Precaution/Restrictions Comments: foley Restrictions Weight Bearing Restrictions Per Provider Order: No     Mobility Bed Mobility Overal bed mobility: Needs  Assistance Bed Mobility: Supine to Sit, Rolling, Sit to Supine Rolling: Contact guard assist, Used rails   Supine to sit: HOB elevated, Contact guard, Used rails, +2 for safety/equipment Sit to supine: Contact guard assist, +2 for safety/equipment, HOB elevated, Used rails        Transfers Overall transfer level: Needs assistance Equipment used: Rolling walker (2 wheels), Rollator (4 wheels) Transfers: Sit to/from Stand Sit to Stand: Contact guard assist, From elevated surface                  Balance Overall balance assessment: Needs assistance Sitting-balance support: No upper extremity supported, Feet supported Sitting balance-Leahy Scale: Fair     Standing balance support: Bilateral upper extremity supported, During functional activity, Reliant on assistive device for balance Standing balance-Leahy Scale: Poor Standing balance comment: variable CGA to minA for static standing, poor dynamic standing balance                           ADL either performed or assessed with clinical judgement   ADL Overall ADL's : Needs assistance/impaired Eating/Feeding: Set up;Sitting   Grooming: Minimal assistance;Standing;Oral care   Upper Body Bathing: Minimal assistance;Standing;Sitting   Lower Body Bathing: Moderate assistance;Sitting/lateral leans;Sit to/from stand   Upper Body Dressing : Minimal assistance;Sitting;Standing   Lower Body Dressing: Moderate assistance;Sit to/from stand;Sitting/lateral leans   Toilet Transfer: Minimal assistance;Ambulation;Rolling walker (2 wheels);BSC/3in1   Toileting- Clothing Manipulation and Hygiene: Contact guard assist       Functional mobility during ADLs: Minimal assistance;Rolling walker (2 wheels)       Vision Baseline Vision/History: 1 Wears glasses Additional Comments: WFL for BADL, needs to be ~5 ft from room to see room number, per daughter has difficulty seeing further away without glasses     Perception  Perception: Not tested       Praxis Praxis: Not tested       Pertinent Vitals/Pain Pain Assessment Pain Assessment: No/denies pain     Extremity/Trunk Assessment Upper Extremity Assessment Upper Extremity Assessment: Generalized weakness   Lower Extremity Assessment Lower Extremity Assessment: Defer to PT evaluation   Cervical / Trunk Assessment Cervical / Trunk Assessment: Normal   Communication Communication Communication: Other (comment);Impaired Factors Affecting Communication: Other (comment) ([place] is primary language, although normally fluent in [place] per daughter)   Cognition Arousal: Alert Behavior During Therapy: WFL for tasks assessed/performed Cognition: Cognition impaired   Orientation impairments: Situation Awareness: Online awareness impaired Memory impairment (select all impairments): Short-term memory, Working memory Attention impairment (select first level of impairment): Selective attention Executive functioning impairment (select all impairments): Problem solving, Reasoning, Sequencing OT - Cognition Comments: pt unable to problem solve simple math task, oriented to DOB but does not recall why she was in the hospital                 Following commands: Impaired Following commands impaired: Follows one step commands with increased time     Cueing  General Comments   Cueing Techniques: Verbal cues;Gestural cues;Tactile cues;Visual cues  VSS   Exercises     Shoulder Instructions      Home Living Family/patient expects to be discharged to:: Private residence Living Arrangements: Children;Other relatives (daughter, SIL, grandson) Available Help at Discharge: Family;Available 24 hours/day Type of Home: House Home Access: Level entry     Home Layout: Two level;Bed/bath upstairs Alternate Level Stairs-Number of Steps: flight   Bathroom Shower/Tub: Chief Strategy Officer: Standard     Home Equipment: None           Prior Functioning/Environment Prior Level of Function : Independent/Modified Independent             Mobility Comments: ind ADLs Comments: 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, industrial/product Problem List: Decreased strength;Decreased range of motion;Impaired balance (sitting and/or standing);Decreased activity tolerance;Decreased cognition   OT Treatment/Interventions: Self-care/ADL training;Therapeutic exercise;DME and/or AE instruction;Energy conservation;Therapeutic activities;Patient/family education;Balance training      OT Goals(Current goals can be found in the care plan section)   Acute Rehab OT Goals Patient Stated Goal: none stated OT Goal Formulation: With patient Time For Goal Achievement: 03/18/24 Potential to Achieve Goals: Good ADL Goals Pt Will Perform Upper Body Dressing: Independently;sitting Pt Will Perform Lower Body Dressing: Independently;sitting/lateral leans;sit to/from stand Pt Will Transfer to Toilet: Independently;ambulating;regular height toilet Pt Will Perform Tub/Shower Transfer: Tub transfer;Shower transfer;Independently;ambulating Additional ADL Goal #1: pt will follow 3 step command with  min cues in prep for ADLs   OT Frequency:  Min 1X/week    Co-evaluation              AM-PAC OT "6 Clicks" Daily Activity     Outcome Measure Help from another person eating meals?: A Little Help from another person taking care of personal grooming?: A Little Help from another person toileting, which includes using toliet, bedpan, or urinal?: A Lot Help from another person bathing (including washing, rinsing, drying)?: A Lot Help from another person to put on and taking off regular upper body clothing?: A Little Help from another person to put on and taking off regular lower body clothing?: A Lot 6 Click Score: 15   End of Session Equipment Utilized During Treatment: Rolling walker (2 wheels);Gait belt Nurse Communication: Mobility  status  Activity Tolerance: Patient tolerated treatment well Patient left: in bed;with call bell/phone within reach;with bed alarm set;with family/visitor present (in bed for RN to pull central line)  OT Visit Diagnosis: Unsteadiness on feet (R26.81);Other abnormalities of gait and mobility (R26.89);Muscle weakness (generalized) (M62.81)                Time: 1610-9604 OT Time Calculation (min): 35 min Charges:  OT General Charges $OT Visit: 1 Visit OT Evaluation $OT Eval Moderate Complexity: 1 Mod  Bowie Delia K, OTD, OTR/L SecureChat Preferred Acute Rehab [phone]   Benedict Brain Koonce 03/04/2024, 12:50 PM

## 2024-03-04 NOTE — TOC Initial Note (Addendum)
 Transition of Care [facility]) - Initial/Assessment Note    Patient Details  Name: Mandy Serrano MRN: [id number] Date of Birth: [birth year]  Transition of Care [facility]) CM/SW Contact:    Ernst Heap Phone Number: [phone] 03/04/2024, 11:52 AM  Clinical Narrative:  11:50 AM- HF CSW attempted to meet with patient at bedside. Patient was being seen by the dietician. CSW will follow up at a more appropriate time.  HF CSW met with patient and daughter at bedside. Patients daughter stated that she lives with the patient, her spouse, and her children. Patients daughter stated the she has no history of HH services. Patients daughter stated that she does not use any equipment. Patient does not have a scale at home. Patient stated that she does not have a PCP. CSW explained that a hospital follow up appointment will be scheduled closer towards dc. Patient and family is agreeable.   CSW inquired about the patient not being insured. Patient and daughter asked about Medicaid. CSW explained that the [facility] Team could assist with the process. Family was agreeable.    TOC will continue following.                  Patient Goals and CMS Choice            Expected Discharge Plan and Services                                              Prior Living Arrangements/Services                       Activities of Daily Living      Permission Sought/Granted                  Emotional Assessment              Admission diagnosis:  Cardiac arrest [facility]) [I46.9] Patient Active Problem List   Diagnosis Date Noted   Cardiac arrest (HCC) 03/02/2024   Shock (HCC) 03/02/2024   Acute respiratory failure with hypoxia (HCC) 03/02/2024   AKI (acute kidney injury) (HCC) 03/02/2024   Frequent PVCs    Acute pulmonary edema (HCC) 06/14/2016   Acute systolic CHF (congestive heart failure) (HCC) 06/14/2016   Malignant hypertension 06/12/2016   PCP:  Pcp, No Pharmacy:    [facility] [address] [place] [address] Phone: [phone] Fax: [phone]  Melodee Spruce [facility] [address] [place] [address] Phone: [phone] Fax: [phone]     Social Drivers of Health (SDOH) Social History: SDOH Screenings   Financial Resource Strain: Medium Risk (12/17/2023)  Tobacco Use: Low Risk  (01/07/2024)   SDOH Interventions:     Readmission Risk Interventions     No data to display

## 2024-03-04 NOTE — Progress Notes (Addendum)
 Rounding Note    Patient Name: Mandy Serrano Date of Encounter: 03/04/2024  [facility] Cardiologist: None   Subjective   Did not remember conversation of eventual ICD yesterday, clear at the time of visit today again.  Inpatient Medications    Scheduled Meds:  aspirin   81 mg Oral Daily   Chlorhexidine Gluconate Cloth  6 each Topical Daily   docusate sodium  100 mg Oral BID   heparin   5,000 Units Subcutaneous Q8H   polyethylene glycol  17 g Oral Daily   Continuous Infusions:  amiodarone  30 mg/hr (03/04/24 1200)   ampicillin-sulbactam (UNASYN) IV Stopped (03/04/24 0933)   PRN Meds: ondansetron  (ZOFRAN ) IV, mouth rinse, polyethylene glycol   Vital Signs    Vitals:   03/04/24 0700 03/04/24 0800 03/04/24 0900 03/04/24 1200  BP:  (!) 148/65  (!) 123/55  Pulse: 78 78 88 78  Resp: 18 15 12 19   Temp: 98.6 F (37 C) 98.8 F (37.1 C) 98.8 F (37.1 C) 98.8 F (37.1 C)  TempSrc:  Bladder    SpO2: 93% 94% 93% 96%  Weight:      Height:        Intake/Output Summary (Last 24 hours) at 03/04/2024 1234 Last data filed at 03/04/2024 1200 Gross per 24 hour  Intake 794.63 ml  Output 2200 ml  Net -1405.37 ml      03/04/2024    5:00 AM 03/02/2024    2:30 AM 01/07/2024    9:19 AM  Last 3 Weights  Weight (lbs) 223 lb 12.3 oz 223 lb 15.8 oz 224 lb  Weight (kg) 101.5 kg 101.6 kg 101.606 kg      Telemetry    SR, PVC burden waxes/wanes, periods of no ectopy with intermittent episodes of brief bigemeny - Personally Reviewed  ECG    No new EKGs - Personally Reviewed  Physical Exam    GEN: No acute distress.   Neck: No JVD Cardiac: RRR, no murmurs, rubs, or gallops.  Respiratory: CTA b/l. GI: Soft, nontender, non-distended  MS: No edema; No deformity. Neuro:  Nonfocal  Psych: Normal affect   Labs    High Sensitivity Troponin:   Recent Labs  Lab 03/02/24 0036 03/02/24 0359 03/03/24 0437  TROPONINIHS 141* 706* 165*     Chemistry Recent Labs  Lab  03/02/24 0036 03/02/24 0115 03/03/24 0437 03/03/24 1640 03/03/24 1750 03/04/24 0524  NA 135   < > 137  --  138 138  K 3.9   < > 3.6  --  4.0 3.9  CL 102   < > 104  --  103 106  CO2 16*   < > 24  --  23 24  GLUCOSE 324*   < > 192*  --  149* 101*  BUN 26*   < > 48*  --  50* 48*  CREATININE 1.57*   < > 3.02*  --  3.23* 3.20*  CALCIUM 9.3   < > 8.0*  --  7.8* 7.8*  MG  --    < > 1.6* 1.1* 2.1 1.9  PROT 6.5  --  5.7*  --   --  6.0*  ALBUMIN 3.0*  --  2.5*  --   --  2.5*  AST 252*  --  93*  --   --  54*  ALT 209*  --  141*  --   --  108*  ALKPHOS 66  --  46  --   --  41  BILITOT  0.6  --  0.3  --   --  0.4  GFRNONAA 40*   < > 18*  --  17* 17*  ANIONGAP 17*   < > 9  --  12 8   < > = values in this interval not displayed.    Lipids  Recent Labs  Lab 03/03/24 0437  TRIG 110    Hematology Recent Labs  Lab 03/02/24 0359 03/02/24 0610 03/03/24 0437 03/04/24 0524  WBC 23.8*  --  11.3* 10.1  RBC 4.06  --  3.75* 3.45*  HGB 12.3 12.6 11.2* 10.4*  HCT 39.0 37.0 34.9* 31.9*  MCV 96.1  --  93.1 92.5  MCH 30.3  --  29.9 30.1  MCHC 31.5  --  32.1 32.6  RDW 14.3  --  14.7 15.1  PLT 320  --  193 169   Thyroid  No results for input(s): "TSH", "FREET4" in the last 168 hours.  BNP Recent Labs  Lab 03/02/24 0359  BNP 137.1*    DDimer No results for input(s): "DDIMER" in the last 168 hours.   Radiology    US  RENAL Result Date: 03/03/2024 CLINICAL DATA:  Acute kidney injury EXAM: RENAL / URINARY TRACT ULTRASOUND COMPLETE COMPARISON:  06/16/2016 FINDINGS: Right Kidney: Renal measurements: 10.1 x 5.5 by 5 cm = volume: 144.8 mL. Echogenic cortex. No hydronephrosis. Cyst with a few scattered internal echoes measuring 17 x 16 x 16 mm, no specific imaging follow-up is recommended. Left Kidney: Renal measurements: 10.8 x 5.1 x 5.2 cm = volume: 149.8 mL. Echogenic cortex. No mass or hydronephrosis. Bladder: Decompressed by Foley catheter Other: None. IMPRESSION: No hydronephrosis. Echogenic  renal cortices consistent with medical renal disease. Electronically Signed   By: Mandy Serrano M.D.   On: 03/03/2024 15:51    EEG adult Result Date: 03/03/2024 Mandy Griffin, MD     03/03/2024  7:12 AM Routine EEG Report Mandy Serrano is a 48 y.o. female with a history of cardiac arrest who is undergoing an EEG to evaluate for seizures. Report: This EEG was acquired with electrodes placed according to the International 10-20 electrode system (including Fp1, Fp2, F3, F4, C3, C4, P3, P4, O1, O2, T3, T4, T5, T6, A1, A2, Fz, Cz, Pz). The following electrodes were missing or displaced: none. There was no clear waking rhythm. Background consists of burst suppression pattern with bursts containing theta-delta activity of 4-6 Hz. This activity is reactive to stimulation. No sleep architecture identified. There was no focal slowing. There were no interictal epileptiform discharges. There were no electrographic seizures identified. Photic stimulation and hyperventilation not performed. Impression and clinical correlation: This EEG was obtained while sedated and is abnormal due to burst suppression pattern indicative of global cerebral dysfunction, medication effect or both. Epileptiform abnormalities were not seen during this recording. Mandy Leaks, MD [facility] [phone] If 7pm- 7am, please page neurology on call as listed in AMION.    Cardiac Studies   Echo 03/03/24   1. Left ventricular ejection fraction, by estimation, is 55 to 60%. The  left ventricle has normal function. The left ventricle has no regional  wall motion abnormalities. There is moderate concentric left ventricular  hypertrophy. Left ventricular  diastolic parameters are consistent with Grade I diastolic dysfunction  (impaired relaxation).   2. Right ventricular systolic function is normal. The right ventricular  size is normal. Tricuspid regurgitation signal is inadequate for assessing  PA pressure.   3. The mitral  valve is normal in structure. No evidence  of mitral valve  regurgitation. No evidence of mitral stenosis.   4. The aortic valve is tricuspid. Aortic valve regurgitation is trivial.   5. The inferior vena cava is normal in size with greater than 50%  respiratory variability, suggesting right atrial pressure of 3 mmHg.    Echo 12/17: EF 55%  Echo 7/19 EF 60-65%. Bedside echo 04/2020 EF 60-65%. Cardiac Monitor 12/2021 > 6% PVC's   Patient Profile     48 y.o. female w/PMHx of  HTN, obesity sickle cell anemia, remote HF (2017 w/recovered LVEF), PVCs   Admitted after an OOH arrest In my review of EMS record Their INITIAL rhythm was VF >> defibrillations, meds > >> PEA >> more EPI > ROSC  PEA was not resenting rhythm  Assessment & Plan    Cardiac arrest Uncertain etiology LVEF remains preserved post arrest Historically c.MRI was normal (done for CM and PVCs)  Creat remains high >> hopefully has peaked and Mandy Serrano    Anticipate ICD implant prior to discharge/timing pending renal function/coronary eval (if able)   Dr. Lawana Serrano has been bedside Further with CCM/HF teams  For questions or updates, please contact [facility] Please consult [email or web address] for contact info under        Signed, Mandy Fails, PA-C  03/04/2024, 12:34 PM    I have seen and examined this patient with Mandy Serrano.  Agree with above, note added to reflect my findings.  No arrhythmias on telemetry.  Awake and alert this morning.  Creatinine remains elevated.  GEN: No acute distress.   Neck: No JVD Cardiac: RRR, no murmurs, rubs, or gallops.  Respiratory: normal BS bases bilaterally. GI: Soft, nontender, non-distended  MS: No edema; No deformity. Neuro:  Nonfocal  Skin: warm and dry Psych: Normal affect    Cardiac arrest: Had 52 minutes down.  Initial rhythm was PEA, but did have VT/VF and received shocks.  She has recovered surprisingly well.   She Rashelle Ireland likely need ICD at discharge.  Toren Tucholski await for further workup per cardiology and primary team.  Rasheida Broden need an Serrano of the coronaries, though creatinine remains elevated. PVCs: Continue amiodarone  Chronic systolic heart failure: Ejection fraction has recovered.  Quinne Pires M. Tishara Pizano MD 03/04/2024 3:44 PM

## 2024-03-04 NOTE — Progress Notes (Signed)

## 2024-03-04 NOTE — Progress Notes (Signed)
 NAMEWm Falkenhagen, MRN:  [id number], DOB:  12-16-1975, LOS: 2 ADMISSION DATE:  03/02/2024, CONSULTATION DATE: 03/02/2024 REFERRING MD: , CHIEF COMPLAINT:  PEA arrest   History of Present Illness:  A 48 y.o. with history of obesity, HTN  was on 5 anti-HTN medicines in [place]), sickle cell anemia, systolic heart failure 05/2016, headaches, and frequent PVCs.   Admitted 8/17 with increased dyspnea and HTN crisis. She had not been taking any medications over the last last year. New acute systolic heart identified on ECHO. LVEF ~25%. CMRI no infiltrative/inflamatory process and EF ~25%. Had high PVC burden (16%) so she was started on amio. Amio eventually switch to flecainide  as EF recovered.    Unable to obtain Entresto  because she is not [place]  citizen.  Her most recent EF 60-65% and she was diagnosed with non-ischemic cardiomyopathy. Last seen in Cardiology/EP office 12/2023 for PVCs. She presented to ED after being found unresponsive and down time 12 min prior to CPR.  CPR started by PD at 2251-03-27.  She was coded for 52 min, mostly PEA with periods of V Tach and V fib.  She was shocked several times and she was given multiple rounds of Epinephrine IVP then started on Epinephrine drip in ED.  She is intubated and poorly responsive.  Family now at bedside.  Pertinent  Medical History  HTN, Non-ischemic Cardiomyopathy, CHFpEF   Significant Hospital Events: Including procedures, antibiotic start and stop dates in addition to other pertinent events   5/11: S/p PEA arrest . Intubated admit to ICU 5/11, 10 am: On Levophed 15 mcg/min, Vasopressin 0.03 unit/min, Propofol and Fentanyl for sedation. ABG 24/440/+8/85%. ABG 7.24/47/159/99%. Positive 2.7 L fluid balance in the last 24 hrs. On Amiodarone  drip. In sinus.  5/12 extubated  Interim History / Subjective:  Off pressors Afebrile No complaints   Objective    Blood pressure 107/66, pulse 78, temperature 98.6 F (37 C), resp. rate 18, height 5'  4.02" (1.626 m), weight 101.5 kg, SpO2 93%.        Intake/Output Summary (Last 24 hours) at 03/04/2024 0102 Last data filed at 03/04/2024 0700 Gross per 24 hour  Intake 1370.96 ml  Output 2400 ml  Net -1029.04 ml   Filed Weights   03/02/24 0230 03/04/24 0500  Weight: 101.6 kg 101.5 kg    Examination: General:  Adult female sitting upright in bed eating breakfast HEENT: MM pink/moist Neuro:  Awake, oriented to person, place.  Slowed responses at times but most appropriate, MAE CV: rr, NSR, no murmur- tele review, no significant ectopy overnight PULM:  non labored, clear, RA GI: soft, bs+, NT, purwick Extremities: warm/dry, slight left calf edema, prior IO, no pitting edema  Skin: no rashes  afebrile  Labs> K 3.9, BUN/ sCr 41/ 2.56> 48/ 3.02> 48/ 3.2, mag 1.9, improving LFTs, WBC 23> 11.3, H/H 10.4/ 31  5/11 BCX> ngtd 2 days 5/11 MRSA PCR > neg 5/11 trach asp>  not sent  Resolved Hospital Problem list    Assessment & Plan:   Cardiac arrest, PEA and VT/VF with multiple shocks - etiology remains unclear, possibly related to PVC's Shock- cardiogenic/ septic, +/- sedation, resolved Type 2 elevated Trop rHFrEF/ NICM PVC's on flecanide HTN (on pta amlodipine , coreg , hydralazine , spiro, losartan , imdur ) Acute encephalopathy post cardiac arrest, resolving - CPR started 12 min after cardiac arrest, ROSC 52 min, s/p defibrillation treated with IV amiodarone  drip  - prior 26-Mar-2018 TTE> LVEF 60-65 - coox reassuring 71 on 5/11 P:  -  remains off pressors, goal MAP > 65 - AHF/ EP following - cont amio for now per cards for PVC suppression - will need further ischemic workup when renal function improves - optimize electrolytes - TTE EF 55-60%, mod LVH, normal RV - complete empiric aspiration coverage today, otherwise remains afebrile, WBC normalized - follow cultures  - cont ASA - cont to hold GDMT, defer to cards, remains normotensive.  Restart hydralazine  then coreg  if needed per  AHF - cont neuro protective measures/ avoid fever - PT/ OT - d/c CVL - stable for transfer to PCU and to [facility] for primary team 5/14   Acute hypoxic hypercapnic respiratory failure, resolved Aspiration PNA - extubated 5/12 - cont to wean O2 for sat goal > 92% - aggressive pulm hygiene/ IS/ mobilize  - completes 3 days of unasyn today, monitor clinically    AKI secondary to hypoperfusion as above, suspect ATN HperK: resolved AGMA, resolved Hypomagnesemia  - BUN/ sCr stable, increasing UOP, hold on diureses.  Wts stable.  Renal US  neg 5/12 - replete electrolytes prn - d/c foley  - trend renal indices  - strict I/Os, daily wts - avoid nephrotoxins, renal dose meds, hemodynamic support as above   Elevated LFT- shock liver - improving, trend periodically   Hyperglycemia - A1c 5.8 - CBG down in 80s overnight, stop SSI prn   Class 2 obesity - diet/ outpt counseling when appropriate   Sickle cell anemia  - does not appear to be on any meds pta, cont supportive care   Best Practice (right click and "Reselect all SmartList Selections" daily)   Diet/type: Regular consistency (see orders) DVT prophylaxis prophylactic heparin   Pressure ulcer(s): N/A GI prophylaxis: N/A Lines: Central line> d/c Foley:  Yes, and it is no longer needed and removal ordered  Code Status:  full code Last date of multidisciplinary goals of care discussion []   Husband updated at bedside 5/13  Labs   CBC: Recent Labs  Lab 03/02/24 0036 03/02/24 0115 03/02/24 0359 03/02/24 0610 03/03/24 0437 03/04/24 0524  WBC 27.5*  --  23.8*  --  11.3* 10.1  NEUTROABS 15.7*  --   --   --   --   --   HGB 12.1 13.3 12.3 12.6 11.2* 10.4*  HCT 40.1 39.0 39.0 37.0 34.9* 31.9*  MCV 100.5*  --  96.1  --  93.1 92.5  PLT 281  --  320  --  193 169    Basic Metabolic Panel: Recent Labs  Lab 03/02/24 0359 03/02/24 0610 03/02/24 0955 03/02/24 1725 03/03/24 0437 03/03/24 1640 03/03/24 1750 03/04/24 0524   NA 137   < > 135 138 137  --  138 138  K 5.4*   < > 4.6 4.1 3.6  --  4.0 3.9  CL 105  --  103 105 104  --  103 106  CO2 20*  --  17* 22 24  --  23 24  GLUCOSE 138*  --  188* 158* 192*  --  149* 101*  BUN 34*  --  40* 41* 48*  --  50* 48*  CREATININE 1.90*  1.91*  --  2.26* 2.56* 3.02*  --  3.23* 3.20*  CALCIUM 8.7*  --  8.6* 8.3* 8.0*  --  7.8* 7.8*  MG 2.0  --   --   --  1.6* 1.1* 2.1 1.9  PHOS 8.6*  --   --   --  3.1  --   --   --    < > =  values in this interval not displayed.   GFR: Estimated Creatinine Clearance: 24.6 mL/min (A) (by C-G formula based on SCr of 3.2 mg/dL (H)). Recent Labs  Lab 03/02/24 0036 03/02/24 0044 03/02/24 0359 03/02/24 0955 03/03/24 0437 03/04/24 0524  WBC 27.5*  --  23.8*  --  11.3* 10.1  LATICACIDVEN  --  8.9*  --  1.3  --   --     Liver Function Tests: Recent Labs  Lab 03/02/24 0036 03/03/24 0437 03/04/24 0524  AST 252* 93* 54*  ALT 209* 141* 108*  ALKPHOS 66 46 41  BILITOT 0.6 0.3 0.4  PROT 6.5 5.7* 6.0*  ALBUMIN 3.0* 2.5* 2.5*   No results for input(s): "LIPASE", "AMYLASE" in the last 168 hours. No results for input(s): "AMMONIA" in the last 168 hours.  ABG    Component Value Date/Time   PHART 7.242 (L) 03/02/2024 0610   PCO2ART 47.0 03/02/2024 0610   PO2ART 159 (H) 03/02/2024 0610   HCO3 20.3 03/02/2024 0610   TCO2 22 03/02/2024 0610   ACIDBASEDEF 7.0 (H) 03/02/2024 0610   O2SAT 99 03/02/2024 0610     Coagulation Profile: No results for input(s): "INR", "PROTIME" in the last 168 hours.  Cardiac Enzymes: No results for input(s): "CKTOTAL", "CKMB", "CKMBINDEX", "TROPONINI" in the last 168 hours.  HbA1C: Hgb A1c MFr Bld  Date/Time Value Ref Range Status  03/03/2024 04:35 AM 5.8 (H) 4.8 - 5.6 % Final    Comment:    (NOTE)         Prediabetes: 5.7 - 6.4         Diabetes: >6.4         Glycemic control for adults with diabetes: <7.0   12/17/2023 11:31 AM 5.6 4.8 - 5.6 % Final    Comment:    (NOTE) Pre diabetes:           5.7%-6.4%  Diabetes:              >6.4%  Glycemic control for   <7.0% adults with diabetes     CBG: Recent Labs  Lab 03/03/24 1544 03/03/24 1942 03/03/24 2345 03/04/24 0331 03/04/24 0801  GLUCAP 122* 112* 88 87 106*    Allergies No Known Allergies   Home Medications  Prior to Admission medications   Medication Sig Start Date End Date Taking? Authorizing Provider  acetaminophen  (TYLENOL ) 500 MG tablet Take 500 mg by mouth every 6 (six) hours as needed for mild pain or headache.    [provider]  amLODipine  (NORVASC ) 10 MG tablet Take 1 tablet (10 mg total) by mouth daily. 12/17/23 03/16/24  Elmarie Hacking, FNP  ASPIR-LOW 81 MG EC tablet TAKE 1 TABLET BY MOUTH DAILY. 02/20/17   Darlis Eisenmenger, MD  Blood Pressure Monitoring (BLOOD PRESSURE CUFF) MISC Please check and monitor blood pressure as advised in the clinic 12/17/23   Elmarie Hacking, FNP  carvedilol  (COREG ) 12.5 MG tablet Take 1 tablet (12.5 mg total) by mouth 2 (two) times daily with a meal. 12/17/23   [place], Arlice Bene, FNP  flecainide  (TAMBOCOR ) 100 MG tablet Take 1 tablet (100 mg total) by mouth 2 (two) times daily. *need appt* 12/17/23   Elmarie Hacking, FNP  hydrALAZINE  (APRESOLINE ) 100 MG tablet Take 1 tablet (100 mg total) by mouth 3 (three) times daily. 12/17/23   Elmarie Hacking, FNP  isosorbide  mononitrate (IMDUR ) 30 MG 24 hr tablet Take 1 tablet (30 mg total) by mouth daily. 01/02/24   [place],  Arlice Bene, FNP  losartan  (COZAAR ) 100 MG tablet Take 1 tablet (100 mg total) by mouth in the morning AND 1/2 tablet (50mg ) every evening. Pt needs to schedule appt with Dr. Lawana Pray for further refills - 3rd attempt. 12/17/23   Elmarie Hacking, FNP  spironolactone  (ALDACTONE ) 25 MG tablet Take 1 tablet (25 mg total) by mouth daily. 12/17/23   Elmarie Hacking, FNP     Critical care time: 35 min         Early Glisson, MSN, AG-ACNP-BC [facility] 03/04/2024, 8:12  AM  See Amion for pager If no response to pager , please call [phone] until 7pm After 7:00 pm call Elink  [phone]

## 2024-03-04 NOTE — Progress Notes (Signed)
 Nutrition Follow-up  DOCUMENTATION CODES:  Not applicable  INTERVENTION:  Continue heart healthy diet as ordered Encourage adequate oral intake  NUTRITION DIAGNOSIS:  Inadequate oral intake related to inability to eat as evidenced by NPO status. - progressing, pt now on a diet  GOAL:  Patient will meet greater than or equal to 90% of their needs - progressing  MONITOR:  Labs, I & O's, Weight trends, TF tolerance, Vent status  REASON FOR ASSESSMENT:  Consult Enteral/tube feeding initiation and management  ASSESSMENT:  48 y.o female presented to the ED after being found unresponsive, with 12 minutes of downtime prior to CPR. PMH includes CHF, HTN, and sickle cell anemia. Pt admitted after cardiac arrest.  EP consulted for evaluation for ICD placement; planning for LHC vs CTA once renal fx improved.   Self extubated yesterday; diet advanced  Spoke with pt at bedside. Nutrition history provided with assistance from her daughter. She reports that her appetite is doing well. Denies any recent changes to her appetite. States that she typically eats 2-3 light meals daily. She enjoys eating a variety of foods including rice, protein, fruits and vegetables.   Pt states that she is uncertain of her usual weight however does not feel that she feels her weight has remained stable with no significant changes.   Admit weight: 101.6 kg Current weight: 101.5 kg  Drains/lines: UOP: x24 hours  Medications: colace BID, miralax daily, IV abx  Labs:  BUN 48 Cr 3.20 AST 54 ALT 108 GFR 17 CBG's 87-122 x24 hours   NUTRITION - FOCUSED PHYSICAL EXAM: Flowsheet Row Most Recent Value  Orbital Region No depletion  Upper Arm Region No depletion  Thoracic and Lumbar Region No depletion  Buccal Region No depletion  Temple Region No depletion  Clavicle Bone Region No depletion  Clavicle and Acromion Bone Region No depletion  Scapular Bone Region No depletion  Dorsal Hand No  depletion  Patellar Region No depletion  Anterior Thigh Region No depletion  Posterior Calf Region No depletion  Edema (RD Assessment) Mild  [non-pitting generalized edema]  Hair Reviewed  Eyes Reviewed  Mouth Reviewed  Skin Reviewed  Nails Reviewed   Diet Order:   Diet Order             Diet Heart Room service appropriate? Yes; Fluid consistency: Thin  Diet effective now                   EDUCATION NEEDS:   Not appropriate for education at this time  Skin:  Skin Assessment: Reviewed RN Assessment  Last BM:  5/12 type 6 large  Height:   Ht Readings from Last 1 Encounters:  03/02/24 5' 4.02" (1.626 m)    Weight:   Wt Readings from Last 1 Encounters:  03/04/24 101.5 kg    Ideal Body Weight:  54.6 kg  BMI:  Body mass index is 38.39 kg/m.  Estimated Nutritional Needs:   Kcal:  1600-1800  Protein:  80-95g  Fluid:  >/=1.6L  Allie Aadam Zhen, RDN, LDN Clinical Nutrition See AMiON for contact information.

## 2024-03-04 NOTE — Progress Notes (Addendum)
 Advanced Heart Failure Rounding Note  Cardiologist: None  Chief Complaint: Out of hospital cardiac arrest Subjective:    Doing well. Rhythm stable no further VT/VF. Occasional PVCs.   K 3.9 Mg 1.9   SCr continues to trend up, 3.2 today  2.4L in UOP yesterday   SBPs overnight 110s.    Objective:   Weight Range: 101.5 kg Body mass index is 38.39 kg/m.   Vital Signs:   Temp:  [98.2 F (36.8 C)-99 F (37.2 C)] 98.6 F (37 C) (05/13 0700) Pulse Rate:  [61-91] 78 (05/13 0700) Resp:  [13-21] 18 (05/13 0700) BP: (107-143)/(54-88) 107/66 (05/13 0100) SpO2:  [92 %-100 %] 93 % (05/13 0700) FiO2 (%):  [40 %] 40 % (05/12 0809) Weight:  [101.5 kg] 101.5 kg (05/13 0500) Last BM Date : 03/02/24  Weight change: Filed Weights   03/02/24 0230 03/04/24 0500  Weight: 101.6 kg 101.5 kg    Intake/Output:   Intake/Output Summary (Last 24 hours) at 03/04/2024 0737 Last data filed at 03/04/2024 0700 Gross per 24 hour  Intake 1370.96 ml  Output 2400 ml  Net -1029.04 ml      Physical Exam    General:  Well appearing. No resp difficulty HEENT: Normal Neck: Supple. JVP not elevated . Carotids 2+ bilat; no bruits. No lymphadenopathy or thyromegaly appreciated. Cor: PMI nondisplaced. Regular rate & rhythm. No rubs, gallops or murmurs. Lungs: Clear Abdomen: Soft, nontender, nondistended. No hepatosplenomegaly. No bruits or masses. Good bowel sounds. Extremities: No cyanosis, clubbing, rash, edema Neuro: Alert & orientedx3, cranial nerves grossly intact. moves all 4 extremities w/o difficulty. Affect pleasant   Telemetry   NSR w/ occasional PVCs, personally reviewed   EKG    N/A  Labs    CBC Recent Labs    03/02/24 0036 03/02/24 0115 03/03/24 0437 03/04/24 0524  WBC 27.5*   < > 11.3* 10.1  NEUTROABS 15.7*  --   --   --   HGB 12.1   < > 11.2* 10.4*  HCT 40.1   < > 34.9* 31.9*  MCV 100.5*   < > 93.1 92.5  PLT 281   < > 193 169   < > = values in this interval  not displayed.   Basic Metabolic Panel Recent Labs    82/95/62 0359 03/02/24 0610 03/03/24 0437 03/03/24 1640 03/03/24 1750 03/04/24 0524  NA 137   < > 137  --  138 138  K 5.4*   < > 3.6  --  4.0 3.9  CL 105   < > 104  --  103 106  CO2 20*   < > 24  --  23 24  GLUCOSE 138*   < > 192*  --  149* 101*  BUN 34*   < > 48*  --  50* 48*  CREATININE 1.90*  1.91*   < > 3.02*  --  3.23* 3.20*  CALCIUM 8.7*   < > 8.0*  --  7.8* 7.8*  MG 2.0  --  1.6*   < > 2.1 1.9  PHOS 8.6*  --  3.1  --   --   --    < > = values in this interval not displayed.   Liver Function Tests Recent Labs    03/03/24 0437 03/04/24 0524  AST 93* 54*  ALT 141* 108*  ALKPHOS 46 41  BILITOT 0.3 0.4  PROT 5.7* 6.0*  ALBUMIN 2.5* 2.5*   No results for input(s): "LIPASE", "AMYLASE" in  the last 72 hours. Cardiac Enzymes No results for input(s): "CKTOTAL", "CKMB", "CKMBINDEX", "TROPONINI" in the last 72 hours.  BNP: BNP (last 3 results) Recent Labs    12/17/23 1131 01/02/24 1559 03/02/24 0359  BNP 133.3* 19.0 137.1*    ProBNP (last 3 results) No results for input(s): "PROBNP" in the last 8760 hours.   D-Dimer No results for input(s): "DDIMER" in the last 72 hours. Hemoglobin A1C Recent Labs    03/03/24 0435  HGBA1C 5.8*   Fasting Lipid Panel Recent Labs    03/03/24 0437  TRIG 110   Thyroid  Function Tests No results for input(s): "TSH", "T4TOTAL", "T3FREE", "THYROIDAB" in the last 72 hours.  Invalid input(s): "FREET3"  Other results:   Imaging    US  RENAL Result Date: 03/03/2024 CLINICAL DATA:  Acute kidney injury EXAM: RENAL / URINARY TRACT ULTRASOUND COMPLETE COMPARISON:  06/16/2016 FINDINGS: Right Kidney: Renal measurements: 10.1 x 5.5 by 5 cm = volume: 144.8 mL. Echogenic cortex. No hydronephrosis. Cyst with a few scattered internal echoes measuring 17 x 16 x 16 mm, no specific imaging follow-up is recommended. Left Kidney: Renal measurements: 10.8 x 5.1 x 5.2 cm = volume: 149.8  mL. Echogenic cortex. No mass or hydronephrosis. Bladder: Decompressed by Foley catheter Other: None. IMPRESSION: No hydronephrosis. Echogenic renal cortices consistent with medical renal disease. Electronically Signed   By: Esmeralda Hedge M.D.   On: 03/03/2024 15:51   ECHOCARDIOGRAM COMPLETE Result Date: 03/03/2024    ECHOCARDIOGRAM REPORT   Patient Name:   Mandy Serrano Date of Exam: 03/03/2024 Medical Rec #:  [id number]     Height:       64.0 in Accession #:    [id number]    Weight:       224.0 lb Date of Birth:  Sep 01, 1976     BSA:          2.053 m Patient Age:    47 years      BP:           145/81 mmHg Patient Gender: F             HR:           80 bpm. Exam Location:  Inpatient Procedure: 2D Echo, Cardiac Doppler, Color Doppler and Intracardiac            Opacification Agent (Both Spectral and Color Flow Doppler were            utilized during procedure). Indications:    Cardiac Arrest I46.9  History:        Patient has prior history of Echocardiogram examinations, most                 recent 10/11/2016.  Sonographer:    Hersey Lorenzo RDCS Referring Phys: [id number] [place] Dk IMPRESSIONS  1. Left ventricular ejection fraction, by estimation, is 55 to 60%. The left ventricle has normal function. The left ventricle has no regional wall motion abnormalities. There is moderate concentric left ventricular hypertrophy. Left ventricular diastolic parameters are consistent with Grade I diastolic dysfunction (impaired relaxation).  2. Right ventricular systolic function is normal. The right ventricular size is normal. Tricuspid regurgitation signal is inadequate for assessing PA pressure.  3. The mitral valve is normal in structure. No evidence of mitral valve regurgitation. No evidence of mitral stenosis.  4. The aortic valve is tricuspid. Aortic valve regurgitation is trivial.  5. The inferior vena cava is normal in size with greater than 50% respiratory variability, suggesting right atrial  pressure of 3 mmHg.  FINDINGS  Left Ventricle: Left ventricular ejection fraction, by estimation, is 55 to 60%. The left ventricle has normal function. The left ventricle has no regional wall motion abnormalities. Definity contrast agent was given IV to delineate the left ventricular  endocardial borders. The left ventricular internal cavity size was normal in size. There is moderate concentric left ventricular hypertrophy. Left ventricular diastolic parameters are consistent with Grade I diastolic dysfunction (impaired relaxation). Right Ventricle: The right ventricular size is normal. No increase in right ventricular wall thickness. Right ventricular systolic function is normal. Tricuspid regurgitation signal is inadequate for assessing PA pressure. Left Atrium: Left atrial size was normal in size. Right Atrium: Right atrial size was normal in size. Pericardium: There is no evidence of pericardial effusion. Mitral Valve: The mitral valve is normal in structure. No evidence of mitral valve regurgitation. No evidence of mitral valve stenosis. Tricuspid Valve: The tricuspid valve is normal in structure. Tricuspid valve regurgitation is not demonstrated. Aortic Valve: The aortic valve is tricuspid. Aortic valve regurgitation is trivial. Pulmonic Valve: The pulmonic valve was normal in structure. Pulmonic valve regurgitation is not visualized. Aorta: The aortic root is normal in size and structure. Venous: The inferior vena cava is normal in size with greater than 50% respiratory variability, suggesting right atrial pressure of 3 mmHg. IAS/Shunts: No atrial level shunt detected by color flow Doppler.  LEFT VENTRICLE PLAX 2D LVIDd:         4.90 cm   Diastology LVIDs:         3.10 cm   LV e' medial:    5.11 cm/s LV PW:         1.40 cm   LV E/e' medial:  13.1 LV IVS:        1.40 cm   LV e' lateral:   8.38 cm/s LVOT diam:     2.30 cm   LV E/e' lateral: 8.0 LV SV:         78 LV SV Index:   38 LVOT Area:     4.15 cm  IVC IVC diam: 1.60 cm LEFT  ATRIUM           Index LA diam:      3.70 cm 1.80 cm/m LA Vol (A4C): 47.3 ml 23.04 ml/m  AORTIC VALVE LVOT Vmax:   98.70 cm/s LVOT Vmean:  59.400 cm/s LVOT VTI:    0.187 m  AORTA Ao Root diam: 3.00 cm Ao Asc diam:  3.10 cm MITRAL VALVE MV Area (PHT): 3.50 cm    SHUNTS MV Decel Time: 217 msec    Systemic VTI:  0.19 m MV E velocity: 67.10 cm/s  Systemic Diam: 2.30 cm MV A velocity: 80.70 cm/s MV E/A ratio:  0.83 Amunique Neyra McleanMD Electronically signed by Archer Bear Signature Date/Time: 03/03/2024/11:28:52 AM    Final      Medications:     Scheduled Medications:  aspirin   81 mg Oral Daily   Chlorhexidine Gluconate Cloth  6 each Topical Daily   docusate  100 mg Oral BID   heparin   5,000 Units Subcutaneous Q8H   insulin aspart  0-15 Units Subcutaneous Q4H   pantoprazole (PROTONIX) IV  40 mg Intravenous Q24H   polyethylene glycol  17 g Oral Daily    Infusions:  amiodarone  30 mg/hr (03/04/24 0700)   ampicillin-sulbactam (UNASYN) IV Stopped (03/03/24 2218)   norepinephrine (LEVOPHED) Adult infusion Stopped (03/03/24 0908)    PRN Medications: ipratropium-albuterol, ondansetron  (ZOFRAN ) IV, mouth rinse, polyethylene  glycol    Patient Profile   48 y.o. with history of systolic heart failure in 2017, felt PVC mediated. PVCs suppressed w/ Flecainide  and EF improved/normalized. Also w/ HTN, sickle cell anemia, admitted post OOH Cardiac Arrest.   Assessment/Plan   1. Cardiac arrest: At least 52 minutes to ROSC.  Initial rhythm for EMS was PEA, but she was also noted to have VF and VT with shocks.  She has recovered surprisingly well, now extubated and conversant.  Echo today showed EF 55-60% with moderate LVH, normal RV, IVC normal.  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  ?VT as initial trigger for arrest.  - Continue amiodarone  gtt for now, eventually to po.  - EP following. They recommend evaluation of coronaries, LHC vs CTA,  if and when renal fx improves  - If initial rhythm was VF, then patient would meet criteria for secondary prevention ICD implant  2. HF with recovered EF: Echo today shows that EF remains normal, persistent recovery from prior nonischemic cardiomyopathy that was thought to be due to HTN +/- frequent PVCs.  LVH on echo c/w HTN.  Prior cardiac MRI with no delayed enhancement.  She is not volume overloaded on exam and IVC small on echo. - Continue PVC suppression w/ amiodarone   - Keep BP controlled.  3. HTN: History of resistant HTN.  Has been off meds post-arrest. SBPs 110s overnight   - With AKI, would stay off spironolactone  and losartan .  - Can restart hydralazine  and then Coreg  as needed as BP trends up.  4. AKI: Suspect ATN due to cardiac arrest/shock. Hopefully will begin to recover. UOP good. Avoid hypotension.  5. PVCs: Long history of frequent PVCs, thought to contribute to prior cardiomyopathy.  She was initially on amiodarone  then switched to flecainide  when EF recovered. ?VT/VF as initial cause of current arrest, VT/VF seen during ACLS (though initial rhythm seen was PEA).  - Amiodarone  gtt for now.  6. Hypomagnesemia: Mg 1.9 - IV mg supp ordered   D/w CCM. Planing transfer out of MICU today. We will continue to follow   Length of Stay: 2  Ernestene Headings  03/04/2024, 7:37 AM  Advanced Heart Failure Team Pager [phone] (M-F; 7a - 5p)  Please contact [facility] for night-coverage after hours (5p -7a ) and weekends on [email or web address]  Patient seen with PA, I formulated the plan and agree with the above note.   No arrhythmias on telemetry.  She is awake and alert this morning.  Creatinine remains elevated at 3.2, stable.  BP is not high.   General: NAD Neck: JVP 8 cm, no thyromegaly or thyroid  nodule.  Lungs: Clear to auscultation bilaterally with normal respiratory effort. CV: Nondisplaced PMI.  Heart regular S1/S2, no S3/S4, no murmur.  No peripheral edema.   Abdomen:  Soft, nontender, no hepatosplenomegaly, no distention.  Skin: Intact without lesions or rashes.  Neurologic: Alert and oriented x 3.  Psych: Normal affect. Extremities: No clubbing or cyanosis.  HEENT: Normal.   She does not need to start back on anti-hypertensives yet.   No further arrhythmias, on IV amiodarone .  Echo with preserved EF.  Ideally would have coronary angiography or coronary CTA, but creatinine elevated to 3.2 so not ready.  EP following, will need eventual decision on ICD.  Difficult as initial rhythm is not totally clear (PEA vs VF/VT).   Peder Bourdon 03/04/2024 9:32 AM

## 2024-03-04 NOTE — Progress Notes (Signed)
 Physical Therapy Treatment Patient Details Name: Mandy Serrano MRN: [id number] DOB: 1975/11/10 Today's Date: 03/04/2024   History of Present Illness 48 yo female admitted 5/11 after found down approximately prior to CPR of 52 minutes. Intubated 5/11-5/12. 5/12 EEG with global cerebral dysfunction. PMhx: HTN, CHF, obesity, PVC, sickle cell anemia.    PT Comments  Pt received in supine, A&O to self, location but not fully to situation/time, family present and supportive, daughter intermittently interpreting to [place] when pt slow to respond to cues from PTA/OT in [place]. Pt with improved attention to task this date and initiation, able to stand for >10 mins and perform standing self-care task at sink prior to gait trial with rollator in hallway. BP slightly elevated but no acute s/sx distress and SpO2 WFL when not using hands while standing, poor signal when pt holding RW, no increased WOB with activity. Pt needing up to minA for gait with rollator and +2 only due to many lines and pt mild impulsivity. RN defer to leave pt in chair due to impending removal of her LIJ line so pt back in supine with bed alarm on for safety and family present in her room. Pt at times scratching at dressing around LIJ line, family able to redirect her when she does this. Pt continues to benefit from PT services to progress toward functional mobility goals, DME below pending progress, needs may change in next 1-2 sessions as she is progressing quickly.    If plan is discharge home, recommend the following: A little help with walking and/or transfers;A little help with bathing/dressing/bathroom;Assistance with cooking/housework;Direct supervision/assist for medications management;Direct supervision/assist for financial management;Assist for transportation;Help with stairs or ramp for entrance;Supervision due to cognitive status   Can travel by private vehicle        Equipment Recommendations  Rolling walker (2 wheels)  (pending progress)    Recommendations for Other Services       Precautions / Restrictions Precautions Precautions: Fall Recall of Precautions/Restrictions: Impaired Precaution/Restrictions Comments: foley Restrictions Weight Bearing Restrictions Per Provider Order: No     Mobility  Bed Mobility Overal bed mobility: Needs Assistance Bed Mobility: Supine to Sit, Rolling, Sit to Supine Rolling: Contact guard assist, Used rails   Supine to sit: HOB elevated, Contact guard, Used rails, +2 for safety/equipment Sit to supine: Contact guard assist, +2 for safety/equipment, HOB elevated, Used rails   General bed mobility comments: +2 assist due to central line, foley and other lines for pt safety, pt quick to initiate, needs cues not to stand right away upon sitting EOB while lines being organized and assessing pt for response to repositioning.    Transfers Overall transfer level: Needs assistance Equipment used: Rolling walker (2 wheels), Rollator (4 wheels) Transfers: Sit to/from Stand Sit to Stand: Contact guard assist, From elevated surface           General transfer comment: EOB>bari RW, then rollator>EOB; cues for line awareness and safe UE placement/not to pull on RW.    Ambulation/Gait Ambulation/Gait assistance: Contact guard assist, Min assist, +2 safety/equipment Gait Distance (Feet): 150 Feet Assistive device: Rollator (4 wheels) Gait Pattern/deviations: Step-through pattern, Drifts right/left, Decreased stride length       General Gait Details: Good posture and effort, slight lateral drift but pt also unfamiliar with AD, fair management of rollator, no buckling or c/o dizziness during trial after initial report of lightheadedness when sitting EOB. SpO2 noisy signal while holding on to rollator handle but no increased WOB or s/sx distress;  HR WFL 80's bpm with exertion. x 2 episodes of mild instability needing minA to correct balance via gait belt/HHA but otherwise  CGA.   Stairs             Wheelchair Mobility     Tilt Bed    Modified Rankin (Stroke Patients Only)       Balance Overall balance assessment: Needs assistance Sitting-balance support: No upper extremity supported, Feet supported Sitting balance-Leahy Scale: Fair     Standing balance support: Single extremity supported, Bilateral upper extremity supported Standing balance-Leahy Scale: Poor Standing balance comment: variable CGA to minA for static standing, poor dynamic standing balance                            Communication Communication Communication: Impaired Factors Affecting Communication: Other (comment) ([place] is primary language, although normally fluent in [place] per daughter)  Cognition Arousal: Alert Behavior During Therapy: WFL for tasks assessed/performed   PT - Cognitive impairments: Orientation, Initiation, Sequencing, Problem solving, Safety/Judgement   Orientation impairments: Time, Situation                   PT - Cognition Comments: pt following commands better when daughter repeats them in [place] (pt's primary language), pt calm and pleasant demeanor, at times mildly impulsive vs delay in response to commands due to slow processing vs language barrier. Following commands: Impaired Following commands impaired: Follows one step commands with increased time    Cueing Cueing Techniques: Verbal cues, Gestural cues, Tactile cues, Visual cues  Exercises      General Comments General comments (skin integrity, edema, etc.): foley appears intact, BP slightly elevated with activity but no c/o pain, pt reports normally she wears glasses so has difficulty seeing room numbers on her wall, able to read it when she gets closer. SpO2 appears WFL on RA at rest, however poor signal on fingers with activity, but when hand removed from RW handle, SpO2 94% and above in standing.      Pertinent Vitals/Pain Pain Assessment Pain Assessment:  PAINAD Breathing: normal Negative Vocalization: none Facial Expression: smiling or inexpressive Body Language: relaxed Consolability: no need to console PAINAD Score: 0 Pain Intervention(s): Monitored during session    Home Living                          Prior Function            PT Goals (current goals can now be found in the care plan section) Acute Rehab PT Goals Patient Stated Goal: To go home with family PT Goal Formulation: With family Time For Goal Achievement: 03/17/24 Progress towards PT goals: Progressing toward goals    Frequency    Min 3X/week      PT Plan      Co-evaluation              AM-PAC PT "6 Clicks" Mobility   Outcome Measure  Help needed turning from your back to your side while in a flat bed without using bedrails?: A Little Help needed moving from lying on your back to sitting on the side of a flat bed without using bedrails?: A Little Help needed moving to and from a bed to a chair (including a wheelchair)?: A Little Help needed standing up from a chair using your arms (e.g., wheelchair or bedside chair)?: A Little Help needed to walk in hospital room?: A Little  Help needed climbing 3-5 steps with a railing? : A Lot 6 Click Score: 17    End of Session Equipment Utilized During Treatment: Gait belt Activity Tolerance: Patient tolerated treatment well Patient left: in bed;with call bell/phone within reach;with family/visitor present;Other (comment) (RN request no OOB to chair due to impending removal of central line) Nurse Communication: Mobility status PT Visit Diagnosis: Muscle weakness (generalized) (M62.81);Difficulty in walking, not elsewhere classified (R26.2)     Time: 5284-1324 PT Time Calculation (min) (ACUTE ONLY): 37 min  Charges:    $Gait Training: 8-22 mins PT General Charges $$ ACUTE PT VISIT: 1 Visit                     Eliza Green P., PTA Acute Rehabilitation Services Secure Chat Preferred  9a-5:30pm Office: [phone]    Mariel Shope [facility] 03/04/2024, 12:04 PM

## 2024-03-05 DIAGNOSIS — I469 Cardiac arrest, cause unspecified: Secondary | ICD-10-CM

## 2024-03-05 LAB — CBC
HCT: 35.3 % — ABNORMAL LOW (ref 36.0–46.0)
Hemoglobin: 11.3 g/dL — ABNORMAL LOW (ref 12.0–15.0)
MCH: 29.2 pg (ref 26.0–34.0)
MCHC: 32 g/dL (ref 30.0–36.0)
MCV: 91.2 fL (ref 80.0–100.0)
Platelets: 191 10*3/uL (ref 150–400)
RBC: 3.87 MIL/uL (ref 3.87–5.11)
RDW: 14.6 % (ref 11.5–15.5)
WBC: 10.7 10*3/uL — ABNORMAL HIGH (ref 4.0–10.5)
nRBC: 0 % (ref 0.0–0.2)

## 2024-03-05 LAB — COMPREHENSIVE METABOLIC PANEL WITH GFR
ALT: 83 U/L — ABNORMAL HIGH (ref 0–44)
AST: 42 U/L — ABNORMAL HIGH (ref 15–41)
Albumin: 3 g/dL — ABNORMAL LOW (ref 3.5–5.0)
Alkaline Phosphatase: 62 U/L (ref 38–126)
Anion gap: 9 (ref 5–15)
BUN: 39 mg/dL — ABNORMAL HIGH (ref 6–20)
CO2: 22 mmol/L (ref 22–32)
Calcium: 8.2 mg/dL — ABNORMAL LOW (ref 8.9–10.3)
Chloride: 106 mmol/L (ref 98–111)
Creatinine, Ser: 2.82 mg/dL — ABNORMAL HIGH (ref 0.44–1.00)
GFR, Estimated: 20 mL/min — ABNORMAL LOW (ref >60)
Glucose, Bld: 102 mg/dL — ABNORMAL HIGH (ref 70–99)
Potassium: 4.4 mmol/L (ref 3.5–5.1)
Sodium: 137 mmol/L (ref 135–145)
Total Bilirubin: 0.4 mg/dL (ref 0.0–1.2)
Total Protein: 6.7 g/dL (ref 6.5–8.1)

## 2024-03-05 LAB — GLUCOSE, CAPILLARY
Glucose-Capillary: 100 mg/dL — ABNORMAL HIGH (ref 70–99)
Glucose-Capillary: 110 mg/dL — ABNORMAL HIGH (ref 70–99)
Glucose-Capillary: 126 mg/dL — ABNORMAL HIGH (ref 70–99)
Glucose-Capillary: 146 mg/dL — ABNORMAL HIGH (ref 70–99)
Glucose-Capillary: 87 mg/dL (ref 70–99)

## 2024-03-05 LAB — MAGNESIUM: Magnesium: 1.9 mg/dL (ref 1.7–2.4)

## 2024-03-05 MED ORDER — HYDRALAZINE HCL 25 MG PO TABS
25.0000 mg | ORAL_TABLET | Freq: Three times a day (TID) | ORAL | Status: DC
  Administered 2024-03-05 – 2024-03-06 (×2): 25 mg via ORAL
  Filled 2024-03-05 (×2): qty 1

## 2024-03-05 MED ORDER — BUTALBITAL-APAP-CAFFEINE 50-325-40 MG PO TABS
2.0000 | ORAL_TABLET | Freq: Once | ORAL | Status: AC
  Administered 2024-03-05: 2 via ORAL
  Filled 2024-03-05: qty 2

## 2024-03-05 MED ORDER — MAGNESIUM SULFATE 2 GM/50ML IV SOLN
2.0000 g | Freq: Once | INTRAVENOUS | Status: AC
  Administered 2024-03-05: 2 g via INTRAVENOUS
  Filled 2024-03-05: qty 50

## 2024-03-05 MED ORDER — HYDRALAZINE HCL 10 MG PO TABS
10.0000 mg | ORAL_TABLET | Freq: Three times a day (TID) | ORAL | Status: DC
  Administered 2024-03-05: 10 mg via ORAL
  Filled 2024-03-05: qty 1

## 2024-03-05 MED ORDER — LORAZEPAM 1 MG PO TABS
1.0000 mg | ORAL_TABLET | Freq: Four times a day (QID) | ORAL | Status: DC | PRN
  Administered 2024-03-05 – 2024-03-06 (×2): 1 mg via ORAL
  Filled 2024-03-05 (×2): qty 1

## 2024-03-05 MED ORDER — LORAZEPAM 0.5 MG PO TABS
0.5000 mg | ORAL_TABLET | Freq: Four times a day (QID) | ORAL | Status: DC | PRN
Start: 2024-03-05 — End: 2024-03-05
  Administered 2024-03-05: 0.5 mg via ORAL
  Filled 2024-03-05: qty 1

## 2024-03-05 NOTE — Progress Notes (Signed)
 Inpatient Rehab Admissions Coordinator:   Per therapy recommendations, patient was screened for CIR candidacy by Wandalee Gust, MS, CCC-SLP. At this time, Pt. Is walking 200 ft CGA and does not appear to demonstrate functional deficits to necessitate AIR admission. I  will not pursue a rehab consult for this Pt.   Recommend other rehab venues to be pursued.  Please contact me with any questions.  Wandalee Gust, MS, CCC-SLP Rehab Admissions Coordinator  [phone] (celll) [phone] (office)

## 2024-03-05 NOTE — Progress Notes (Addendum)
 Advanced Heart Failure Rounding Note  Cardiologist: None  Chief Complaint: Out of hospital cardiac arrest Subjective:    Remains on IV amio. C/w frequent PVCs and runs of bigeminy. No VT or VF K 4.4 Mg 1.9   SCr improving, 3.2>>2.82   SBPs now running 150s-170s   Denies CP/ dyspnea. No syncope/ near syncope. Eager to go home.   Objective:   Weight Range: 105.2 kg Body mass index is 39.79 kg/m.   Vital Signs:   Temp:  [97.7 F (36.5 C)-99.3 F (37.4 C)] 98.9 F (37.2 C) (05/14 0749) Pulse Rate:  [75-85] 75 (05/14 0749) Resp:  [13-20] 17 (05/14 0749) BP: (123-170)/(55-103) 170/103 (05/14 0749) SpO2:  [96 %-98 %] 97 % (05/14 0749) Weight:  [105.2 kg] 105.2 kg (05/14 0548) Last BM Date : 03/03/24  Weight change: Filed Weights   03/02/24 0230 03/04/24 0500 03/05/24 0548  Weight: 101.6 kg 101.5 kg 105.2 kg    Intake/Output:   Intake/Output Summary (Last 24 hours) at 03/05/2024 0909 Last data filed at 03/04/2024 1900 Gross per 24 hour  Intake 266.7 ml  Output 2025 ml  Net -1758.3 ml      Physical Exam    General:  Well appearing, obese. No respiratory difficulty HEENT: normal Neck: supple. no JVD. Carotids 2+ bilat; no bruits. No lymphadenopathy or thyromegaly appreciated. Cor: PMI nondisplaced. Regular rate & rhythm. No rubs, gallops or murmurs. Lungs: clear Abdomen: soft, nontender, nondistended. No hepatosplenomegaly. No bruits or masses. Good bowel sounds. Extremities: no cyanosis, clubbing, rash, edema Neuro: alert & oriented x 3, cranial nerves grossly intact. moves all 4 extremities w/o difficulty. Affect pleasant.   Telemetry   NSR w/ PVCs and runs of bigeminy,  personally reviewed   EKG    N/A  Labs    CBC Recent Labs    03/04/24 0524 03/05/24 0335  WBC 10.1 10.7*  HGB 10.4* 11.3*  HCT 31.9* 35.3*  MCV 92.5 91.2  PLT 169 191   Basic Metabolic Panel Recent Labs    09/81/19 0437 03/03/24 1640 03/04/24 0524 03/05/24 0335   NA 137   < > 138 137  K 3.6   < > 3.9 4.4  CL 104   < > 106 106  CO2 24   < > 24 22  GLUCOSE 192*   < > 101* 102*  BUN 48*   < > 48* 39*  CREATININE 3.02*   < > 3.20* 2.82*  CALCIUM 8.0*   < > 7.8* 8.2*  MG 1.6*   < > 1.9 1.9  PHOS 3.1  --   --   --    < > = values in this interval not displayed.   Liver Function Tests Recent Labs    03/04/24 0524 03/05/24 0335  AST 54* 42*  ALT 108* 83*  ALKPHOS 41 62  BILITOT 0.4 0.4  PROT 6.0* 6.7  ALBUMIN 2.5* 3.0*   No results for input(s): "LIPASE", "AMYLASE" in the last 72 hours. Cardiac Enzymes No results for input(s): "CKTOTAL", "CKMB", "CKMBINDEX", "TROPONINI" in the last 72 hours.  BNP: BNP (last 3 results) Recent Labs    12/17/23 1131 01/02/24 1559 03/02/24 0359  BNP 133.3* 19.0 137.1*    ProBNP (last 3 results) No results for input(s): "PROBNP" in the last 8760 hours.   D-Dimer No results for input(s): "DDIMER" in the last 72 hours. Hemoglobin A1C Recent Labs    03/03/24 0435  HGBA1C 5.8*   Fasting Lipid Panel Recent Labs  03/03/24 0437  TRIG 110   Thyroid  Function Tests No results for input(s): "TSH", "T4TOTAL", "T3FREE", "THYROIDAB" in the last 72 hours.  Invalid input(s): "FREET3"  Other results:   Imaging    No results found.    Medications:     Scheduled Medications:  aspirin   81 mg Oral Daily   docusate sodium  100 mg Oral BID   heparin   5,000 Units Subcutaneous Q8H   insulin aspart  2-6 Units Subcutaneous TID WC   polyethylene glycol  17 g Oral Daily    Infusions:  amiodarone  30 mg/hr (03/05/24 0353)   magnesium  sulfate bolus IVPB      PRN Medications: ondansetron  (ZOFRAN ) IV, mouth rinse, polyethylene glycol    Patient Profile   48 y.o. with history of systolic heart failure in 2017, felt PVC mediated. PVCs suppressed w/ Flecainide  and EF improved/normalized. Also w/ HTN, sickle cell anemia, admitted post OOH Cardiac Arrest.   Assessment/Plan   1. Cardiac  arrest: At least 52 minutes to ROSC.  Initial rhythm for EMS was PEA, but she was also noted to have VF and VT with shocks.  She has recovered surprisingly well, now extubated and conversant.  Echo today showed EF 55-60% with moderate LVH, normal RV, IVC normal.  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  ?VT as initial trigger for arrest.  - Continue amiodarone  gtt for now, eventually to po.  - EP following. They recommend evaluation of coronaries, LHC vs CTA, when renal fx normalizes. AKI improving but SCr still elevated above 2.  - If initial rhythm was VF, then patient would meet criteria for secondary prevention ICD implant  2. HF with recovered EF: Echo today shows that EF remains normal, persistent recovery from prior nonischemic cardiomyopathy that was thought to be due to HTN +/- frequent PVCs.  LVH on echo c/w HTN.  Prior cardiac MRI with no delayed enhancement.  She is not volume overloaded on exam and IVC small on echo. - Continue PVC suppression w/ amiodarone   - Keep BP controlled. W/ SBPs now in the 150s-170s, will start low dose hydralazine  3. HTN: History of resistant HTN.  Has been off meds post-arrest. SBPs now elevated  - With AKI, would stay off spironolactone  and losartan .  - Restart hydralazine  and then Coreg  as needed as BP trends up.  4. AKI: Suspect ATN due to cardiac arrest/shock. SCr peaked at 3.2 but showing s/o recovery. UOP good, auto diuresing. Scr down to 2.8 today   - continue to follow BMP and avoid hypotension/nephrotoxic agents  5. PVCs: Long history of frequent PVCs, thought to contribute to prior cardiomyopathy.  She was initially on amiodarone  then switched to flecainide  when EF recovered. ?VT/VF as initial cause of current arrest, VT/VF seen during ACLS (though initial rhythm seen was PEA).  - Amiodarone  gtt for now.  6. Hypomagnesemia: Mg 1.9 - IV mg supp ordered     Length of Stay: 3  Brittainy  Simmons, PA-C  03/05/2024, 9:09 AM  Advanced Heart Failure Team Pager [phone] (M-F; 7a - 5p)  Please contact [facility] for night-coverage after hours (5p -7a ) and weekends on [email or web address]  Agree with the above PA note and I formulated plan.   Stable today, creatinine trending down, 2.82.  BP elevated.  No arrhythmias.   General: NAD Neck: No JVD, no thyromegaly or thyroid  nodule.  Lungs: Clear to auscultation bilaterally with normal respiratory effort. CV: Nondisplaced PMI.  Heart regular S1/S2, no S3/S4, no murmur.  No peripheral edema.    Abdomen: Soft, nontender, no hepatosplenomegaly, no distention.  Skin: Intact without lesions or rashes.  Neurologic: Alert and oriented x 3.  Psych: Normal affect. Extremities: No clubbing or cyanosis.  HEENT: Normal.   Per EP review of EMS strips, initial rhythm actually was VF.  She will need secondary prevention ICD.   Ideally would have coronary angiography but creatinine still 2.82.  Now trending down.  Will follow.   BP elevated, will start hydralazine  25 mg tid and follow.   Peder Bourdon 03/05/2024 3:12 PM

## 2024-03-05 NOTE — Progress Notes (Addendum)
 PROGRESS NOTE  Reniah Hillmann ZHY:[id number] DOB: [birth year] DOA: 03/02/2024 PCP: Pcp, No   LOS: 3 days   Brief narrative:  Patient is 48 y.o. with history of obesity, HTN  was on 5 anti-HTN medicines in [place]), sickle cell anemia, systolic heart failure 05/2016, headaches, and frequent PVCs was admitted to the hospital on previously in 2017 with dyspnea and hypertension.  She had not been taking any medications for the last 1 year.  Patient was noted EF of 25% yesterday with which was subsequently changed to flecainide  if required.  Patient is opting interested.  Most recent EF was 60 to 65% and was last seen in cardiology office on 18th subsequently patient was found to be unresponsive with downtime of 12 min prior to CPR.   She was coded for 52 min, mostly PEA with periods of V Tach and V fib.  She was shocked several times and she was given multiple rounds of Epinephrine IVP then started on Epinephrine drip in ED and vasopressin was added..  She is intubated and poorly responsive.  Patient was then out of the hospital in the ICU setting.  Patient was subsequently extubated on 03/03/2024.  Subsequently was transferred out of the ICU.  Assessment/Plan: Principal Problem:   Cardiac arrest [facility]) Active Problems:   Shock (HCC)   Acute respiratory failure with hypoxia (HCC)   AKI (acute kidney injury) (HCC)  Cardiac arrest with ROSC.  Initial rhythm was PEA but then subsequently had VF and VT and needed shocks.  Was initially in the ICU and currently stable and extubated.  Echocardiogram showed EF of 55 to 60% but has history of PVCs on flecainide .  PVCs were thought to be contributing to her cardiomyopathy.  Currently on amiodarone  drip.  Electrophysiology cardiology on board.  Plan for card catheterization but renal function still elevated.  Continue aspirin   Acute hypoxic respiratory failure secondary to possible aspiration pneumonia.  Completed course of Unasyn for 5 days.  Currently on room air  and has improved  Acute kidney injury.  Better.  Renal ultrasound was negative.  Latest creatinine of 2.8 from 3.2.  Congestive heart failure with recovered ejection.  Not volume overloaded. History of resistant hypertension.  Was on multiple medications in the past but was not taking medications.  Unable to use spironolactone  or losartan  at this time due to AKI.  Continue Coreg  and hydralazine .  Significant PVC burden.  On amiodarone  at this time.  Hypomagnesemia.  Has been replenished.  Latest magnesium  of 1.9.  Class II obesity. Body mass index is 39.79 kg/m.  Would benefit from lifestyle modification and ongoing weight loss.  History of sickle cell anemia.  Not in acute exacerbation.  Will continue to monitor CBC.  Elevated LFTs due to shock liver.  Will continue to monitor.  Hyperglycemia.  Latest A1c of 5.8.  On sliding scale insulin.   DVT prophylaxis: heparin  injection 5,000 Units Start: 03/02/24 0600 SCDs Start: 03/02/24 0207   Disposition: Physical therapy has recommended CIR.  Status is: Inpatient  Remains inpatient appropriate because: Pending cardiac workup, renal failure, status post cardiac arrest, need for rehabilitation    Code Status:     Code Status: Full Code  Family Communication: Spoke with the patient's spouse at bedside.  Consultants: Cardiology Electrophysiology cardiology PCCM  Procedures: CPR with shocks. Intubation and mechanical ventilation with subsequent extubation,   Anti-infectives:  Unasyn completed course.  Anti-infectives (From admission, onward)    Start     Dose/Rate Route Frequency Ordered  Stop   03/03/24 2100  Ampicillin-Sulbactam (UNASYN) 3 g in sodium chloride  0.9 % 100 mL IVPB        3 g 200 mL/hr over 30 Minutes Intravenous Every 12 hours 03/03/24 0814 03/04/24 2222   03/02/24 0300  Ampicillin-Sulbactam (UNASYN) 3 g in sodium chloride  0.9 % 100 mL IVPB  Status:  Discontinued        3 g 200 mL/hr over 30 Minutes  Intravenous Every 6 hours 03/02/24 0245 03/03/24 0814   03/02/24 0215  cefTRIAXone (ROCEPHIN) 1 g in sodium chloride  0.9 % 100 mL IVPB  Status:  Discontinued        1 g 200 mL/hr over 30 Minutes Intravenous Every 24 hours 03/02/24 0210 03/02/24 0244       Subjective: Today, patient was seen and examined at bedside.  Today patient denies any chest pain, nausea, vomiting, shortness of breath, fever or chills.  On IV amiodarone .  Still with PVCs.  Has mild cough.  Objective: Vitals:   03/05/24 0745 03/05/24 0749  BP:  (!) 170/103  Pulse:  75  Resp:  17  Temp: (P) 98.6 F (37 C) 98.9 F (37.2 C)  SpO2:  97%    Intake/Output Summary (Last 24 hours) at 03/05/2024 0930 Last data filed at 03/04/2024 1900 Gross per 24 hour  Intake 266.7 ml  Output 2025 ml  Net -1758.3 ml   Filed Weights   03/02/24 0230 03/04/24 0500 03/05/24 0548  Weight: 101.6 kg 101.5 kg 105.2 kg   Body mass index is 39.79 kg/m.   Physical Exam:  GENERAL: Patient is alert awake and oriented. Not in obvious distress.  Obese build. HENT: No scleral pallor or icterus. Pupils equally reactive to light. Oral mucosa is moist NECK: is supple, no gross swelling noted. CHEST: Clear to auscultation. No crackles or wheezes.  Chest wall dressing noted CVS: S1 and S2 heard, no murmur. Regular rate and rhythm.  ABDOMEN: Soft, non-tender, bowel sounds are present. EXTREMITIES: No edema. CNS: Cranial nerves are intact. No focal motor deficits. SKIN: warm and dry without rashes.  Data Review: I have personally reviewed the following laboratory data and studies,  CBC: Recent Labs  Lab 03/02/24 0036 03/02/24 0115 03/02/24 0359 03/02/24 0610 03/03/24 0437 03/04/24 0524 03/05/24 0335  WBC 27.5*  --  23.8*  --  11.3* 10.1 10.7*  NEUTROABS 15.7*  --   --   --   --   --   --   HGB 12.1   < > 12.3 12.6 11.2* 10.4* 11.3*  HCT 40.1   < > 39.0 37.0 34.9* 31.9* 35.3*  MCV 100.5*  --  96.1  --  93.1 92.5 91.2  PLT 281  --   320  --  193 169 191   < > = values in this interval not displayed.   Basic Metabolic Panel: Recent Labs  Lab 03/02/24 0359 03/02/24 0610 03/02/24 1725 03/03/24 0437 03/03/24 1640 03/03/24 1750 03/04/24 0524 03/05/24 0335  NA 137   < > 138 137  --  138 138 137  K 5.4*   < > 4.1 3.6  --  4.0 3.9 4.4  CL 105   < > 105 104  --  103 106 106  CO2 20*   < > 22 24  --  23 24 22   GLUCOSE 138*   < > 158* 192*  --  149* 101* 102*  BUN 34*   < > 41* 48*  --  50*  48* 39*  CREATININE 1.90*  1.91*   < > 2.56* 3.02*  --  3.23* 3.20* 2.82*  CALCIUM 8.7*   < > 8.3* 8.0*  --  7.8* 7.8* 8.2*  MG 2.0  --   --  1.6* 1.1* 2.1 1.9 1.9  PHOS 8.6*  --   --  3.1  --   --   --   --    < > = values in this interval not displayed.   Liver Function Tests: Recent Labs  Lab 03/02/24 0036 03/03/24 0437 03/04/24 0524 03/05/24 0335  AST 252* 93* 54* 42*  ALT 209* 141* 108* 83*  ALKPHOS 66 46 41 62  BILITOT 0.6 0.3 0.4 0.4  PROT 6.5 5.7* 6.0* 6.7  ALBUMIN 3.0* 2.5* 2.5* 3.0*   No results for input(s): "LIPASE", "AMYLASE" in the last 168 hours. No results for input(s): "AMMONIA" in the last 168 hours. Cardiac Enzymes: No results for input(s): "CKTOTAL", "CKMB", "CKMBINDEX", "TROPONINI" in the last 168 hours. BNP (last 3 results) Recent Labs    12/17/23 1131 01/02/24 1559 03/02/24 0359  BNP 133.3* 19.0 137.1*    ProBNP (last 3 results) No results for input(s): "PROBNP" in the last 8760 hours.  CBG: Recent Labs  Lab 03/04/24 1954 03/04/24 2030 03/04/24 2353 03/05/24 0342 03/05/24 0744  GLUCAP 103* 126* 122* 100* 110*   Recent Results (from the past 240 hours)  MRSA Next Gen by PCR, Nasal     Status: None   Collection Time: 03/02/24  2:07 AM   Specimen: Nasal Mucosa; Nasal Swab  Result Value Ref Range Status   MRSA by PCR Next Gen NOT DETECTED NOT DETECTED Final    Comment: (NOTE) The GeneXpert MRSA Assay (FDA approved for NASAL specimens only), is one component of a comprehensive  MRSA colonization surveillance program. It is not intended to diagnose MRSA infection nor to guide or monitor treatment for MRSA infections. Test performance is not FDA approved in patients less than 38 years old. Performed at [facility], [address], [place], [place] [address]   Culture, blood (Routine X 2) w Reflex to ID Panel     Status: None (Preliminary result)   Collection Time: 03/02/24  3:57 AM   Specimen: BLOOD  Result Value Ref Range Status   Specimen Description BLOOD SITE NOT SPECIFIED  Final   Special Requests   Final    BOTTLES DRAWN AEROBIC AND ANAEROBIC Blood Culture results may not be optimal due to an inadequate volume of blood received in culture bottles   Culture   Final    NO GROWTH 3 DAYS Performed at [facility], [address], [place], [place] [address]    Report Status PENDING  Incomplete  Culture, blood (Routine X 2) w Reflex to ID Panel     Status: None (Preliminary result)   Collection Time: 03/02/24  3:58 AM   Specimen: BLOOD  Result Value Ref Range Status   Specimen Description BLOOD SITE NOT SPECIFIED  Final   Special Requests   Final    BOTTLES DRAWN AEROBIC AND ANAEROBIC Blood Culture results may not be optimal due to an inadequate volume of blood received in culture bottles   Culture   Final    NO GROWTH 3 DAYS Performed at [facility], [address], [place], [place] [address]    Report Status PENDING  Incomplete  MRSA Next Gen by PCR, Nasal     Status: None  Collection Time: 03/02/24  6:15 AM   Specimen: Nasal Mucosa; Nasal Swab  Result Value Ref Range Status   MRSA by PCR Next Gen NOT DETECTED NOT DETECTED Final    Comment: (NOTE) The GeneXpert MRSA Assay (FDA approved for NASAL specimens only), is one component of a comprehensive MRSA colonization surveillance program. It is not intended to diagnose MRSA infection nor to guide or monitor treatment for MRSA infections. Test performance is not FDA approved in  patients less than 41 years old. Performed at [facility], [address], [place], [place] [address]      Studies: US  RENAL Result Date: 03/03/2024 CLINICAL DATA:  Acute kidney injury EXAM: RENAL / URINARY TRACT ULTRASOUND COMPLETE COMPARISON:  06/16/2016 FINDINGS: Right Kidney: Renal measurements: 10.1 x 5.5 by 5 cm = volume: 144.8 mL. Echogenic cortex. No hydronephrosis. Cyst with a few scattered internal echoes measuring 17 x 16 x 16 mm, no specific imaging follow-up is recommended. Left Kidney: Renal measurements: 10.8 x 5.1 x 5.2 cm = volume: 149.8 mL. Echogenic cortex. No mass or hydronephrosis. Bladder: Decompressed by Foley catheter Other: None. IMPRESSION: No hydronephrosis. Echogenic renal cortices consistent with medical renal disease. Electronically Signed   By: Esmeralda Hedge M.D.   On: 03/03/2024 15:51   ECHOCARDIOGRAM COMPLETE Result Date: 03/03/2024    ECHOCARDIOGRAM REPORT   Patient Name:   KAPRISHA REIDEL Date of Exam: 03/03/2024 Medical Rec #:  [id number]     Height:       64.0 in Accession #:    [id number]    Weight:       224.0 lb Date of Birth:  10/02/1976     BSA:          2.053 m Patient Age:    47 years      BP:           145/81 mmHg Patient Gender: F             HR:           80 bpm. Exam Location:  Inpatient Procedure: 2D Echo, Cardiac Doppler, Color Doppler and Intracardiac            Opacification Agent (Both Spectral and Color Flow Doppler were            utilized during procedure). Indications:    Cardiac Arrest I46.9  History:        Patient has prior history of Echocardiogram examinations, most                 recent 10/11/2016.  Sonographer:    Hersey Lorenzo RDCS Referring Phys: [id number] [place] Dk IMPRESSIONS  1. Left ventricular ejection fraction, by estimation, is 55 to 60%. The left ventricle has normal function. The left ventricle has no regional wall motion abnormalities. There is moderate concentric left ventricular hypertrophy. Left ventricular diastolic  parameters are consistent with Grade I diastolic dysfunction (impaired relaxation).  2. Right ventricular systolic function is normal. The right ventricular size is normal. Tricuspid regurgitation signal is inadequate for assessing PA pressure.  3. The mitral valve is normal in structure. No evidence of mitral valve regurgitation. No evidence of mitral stenosis.  4. The aortic valve is tricuspid. Aortic valve regurgitation is trivial.  5. The inferior vena cava is normal in size with greater than 50% respiratory variability, suggesting right atrial pressure of 3 mmHg. FINDINGS  Left Ventricle: Left ventricular ejection fraction, by estimation, is 55 to 60%. The left ventricle has normal  function. The left ventricle has no regional wall motion abnormalities. Definity contrast agent was given IV to delineate the left ventricular  endocardial borders. The left ventricular internal cavity size was normal in size. There is moderate concentric left ventricular hypertrophy. Left ventricular diastolic parameters are consistent with Grade I diastolic dysfunction (impaired relaxation). Right Ventricle: The right ventricular size is normal. No increase in right ventricular wall thickness. Right ventricular systolic function is normal. Tricuspid regurgitation signal is inadequate for assessing PA pressure. Left Atrium: Left atrial size was normal in size. Right Atrium: Right atrial size was normal in size. Pericardium: There is no evidence of pericardial effusion. Mitral Valve: The mitral valve is normal in structure. No evidence of mitral valve regurgitation. No evidence of mitral valve stenosis. Tricuspid Valve: The tricuspid valve is normal in structure. Tricuspid valve regurgitation is not demonstrated. Aortic Valve: The aortic valve is tricuspid. Aortic valve regurgitation is trivial. Pulmonic Valve: The pulmonic valve was normal in structure. Pulmonic valve regurgitation is not visualized. Aorta: The aortic root is normal  in size and structure. Venous: The inferior vena cava is normal in size with greater than 50% respiratory variability, suggesting right atrial pressure of 3 mmHg. IAS/Shunts: No atrial level shunt detected by color flow Doppler.  LEFT VENTRICLE PLAX 2D LVIDd:         4.90 cm   Diastology LVIDs:         3.10 cm   LV e' medial:    5.11 cm/s LV PW:         1.40 cm   LV E/e' medial:  13.1 LV IVS:        1.40 cm   LV e' lateral:   8.38 cm/s LVOT diam:     2.30 cm   LV E/e' lateral: 8.0 LV SV:         78 LV SV Index:   38 LVOT Area:     4.15 cm  IVC IVC diam: 1.60 cm LEFT ATRIUM           Index LA diam:      3.70 cm 1.80 cm/m LA Vol (A4C): 47.3 ml 23.04 ml/m  AORTIC VALVE LVOT Vmax:   98.70 cm/s LVOT Vmean:  59.400 cm/s LVOT VTI:    0.187 m  AORTA Ao Root diam: 3.00 cm Ao Asc diam:  3.10 cm MITRAL VALVE MV Area (PHT): 3.50 cm    SHUNTS MV Decel Time: 217 msec    Systemic VTI:  0.19 m MV E velocity: 67.10 cm/s  Systemic Diam: 2.30 cm MV A velocity: 80.70 cm/s MV E/A ratio:  0.83 Dalton McleanMD Electronically signed by Archer Bear Signature Date/Time: 03/03/2024/11:28:52 AM    Final       Rosena Conradi, MD  [facility] 03/05/2024  If 7PM-7AM, please contact night-coverage

## 2024-03-05 NOTE — Progress Notes (Signed)
 Physical Therapy Treatment Patient Details Name: Klyn Christakos MRN: [id number] DOB: October 31, 1975 Today's Date: 03/05/2024   History of Present Illness 48 yo female admitted 5/11 after found down approximately prior to CPR of 52 minutes. Intubated 5/11-5/12. 5/12 EEG with global cerebral dysfunction. PMhx: HTN, CHF, obesity, PVC, sickle cell anemia.    PT Comments  Pt up in chair on arrival with daughter present and supportive at bedside. Pt demonstrating continued progress towards acute mobility goals, however continues to be limited in safe mobility by decreased insight into deficits, poor safety awareness, mild impulsivity and poor balance/postural reactions. Pt demonstrating transfers and gait with rollator support with grossly CGA-min A for assist with balance and rollator management. Pt requiring cues for safety throughout session, with daughter interpreting as needed. Patient will benefit from intensive inpatient follow-up therapy, >3 hours/day to progress toward functional mobility goals.       If plan is discharge home, recommend the following: A little help with walking and/or transfers;A little help with bathing/dressing/bathroom;Assistance with cooking/housework;Direct supervision/assist for medications management;Direct supervision/assist for financial management;Assist for transportation;Help with stairs or ramp for entrance;Supervision due to cognitive status   Can travel by private vehicle        Equipment Recommendations  Rolling walker (2 wheels) (pending progress)    Recommendations for Other Services       Precautions / Restrictions Precautions Precautions: Fall Recall of Precautions/Restrictions: Impaired Restrictions Weight Bearing Restrictions Per Provider Order: No     Mobility  Bed Mobility Overal bed mobility: Needs Assistance             General bed mobility comments: up in chair on arrival    Transfers Overall transfer level: Needs  assistance Equipment used: Rollator (4 wheels), None Transfers: Sit to/from Stand Sit to Stand: Contact guard assist           General transfer comment: CGA for safety, able to perform without UE support x3    Ambulation/Gait Ambulation/Gait assistance: Contact guard assist, Min assist Gait Distance (Feet): 200 Feet Assistive device: Rollator (4 wheels) Gait Pattern/deviations: Step-through pattern, Drifts right/left, Decreased stride length Gait velocity: decr     General Gait Details: min A to manage rollator as pt initially pushing too far anterior to self and needing cues to center self in rollator, drfiting R/L in hall, no overt LOB,   Stairs             Wheelchair Mobility     Tilt Bed    Modified Rankin (Stroke Patients Only)       Balance Overall balance assessment: Needs assistance Sitting-balance support: No upper extremity supported, Feet supported Sitting balance-Leahy Scale: Fair     Standing balance support: Bilateral upper extremity supported, During functional activity, Reliant on assistive device for balance Standing balance-Leahy Scale: Poor Standing balance comment: variable CGA to minA for static standing, poor dynamic standing balance                            Communication Communication Communication: Other (comment);Impaired Factors Affecting Communication: Other (comment) ([place] is primary language, although normally fluent in [place] per daughter)  Cognition Arousal: Alert Behavior During Therapy: WFL for tasks assessed/performed   PT - Cognitive impairments: Orientation, Initiation, Sequencing, Problem solving, Safety/Judgement   Orientation impairments: Time, Situation                   PT - Cognition Comments: pt following commands better when daughter  repeats them in [place] (pt's primary language), pt calm and pleasant demeanor, at times mildly impulsive, pt unable to recall if breakfast tray had arrived  this morning, called down to service response to confirm tray had been sent Following commands: Impaired Following commands impaired: Follows one step commands with increased time    Cueing Cueing Techniques: Verbal cues, Gestural cues, Tactile cues, Visual cues  Exercises      General Comments General comments (skin integrity, edema, etc.): VSS on RA, daughter present and supportive      Pertinent Vitals/Pain Pain Assessment Pain Assessment: No/denies pain Pain Intervention(s): Monitored during session    Home Living                          Prior Function            PT Goals (current goals can now be found in the care plan section) Acute Rehab PT Goals Patient Stated Goal: To go home with family PT Goal Formulation: With family Time For Goal Achievement: 03/17/24 Progress towards PT goals: Progressing toward goals    Frequency    Min 3X/week      PT Plan      Co-evaluation              AM-PAC PT "6 Clicks" Mobility   Outcome Measure  Help needed turning from your back to your side while in a flat bed without using bedrails?: A Little Help needed moving from lying on your back to sitting on the side of a flat bed without using bedrails?: A Little Help needed moving to and from a bed to a chair (including a wheelchair)?: A Little Help needed standing up from a chair using your arms (e.g., wheelchair or bedside chair)?: A Little Help needed to walk in hospital room?: A Little Help needed climbing 3-5 steps with a railing? : A Lot 6 Click Score: 17    End of Session Equipment Utilized During Treatment: Gait belt Activity Tolerance: Patient tolerated treatment well Patient left: with call bell/phone within reach;in chair;with chair alarm set;with family/visitor present Nurse Communication: Mobility status PT Visit Diagnosis: Muscle weakness (generalized) (M62.81);Difficulty in walking, not elsewhere classified (R26.2)     Time: 6962-9528 PT  Time Calculation (min) (ACUTE ONLY): 21 min  Charges:    $Gait Training: 8-22 mins PT General Charges $$ ACUTE PT VISIT: 1 Visit                     Zadie Deemer R. PTA Acute Rehabilitation Services Office: [phone]   Agapito Horseman 03/05/2024, 10:26 AM

## 2024-03-05 NOTE — Progress Notes (Addendum)
 Chart/tele check Out of ICU Creat today is down some at 2.82 Telemetry w/SR 80's, overall low PVC burden, intermittently brief V bigemeny Hopefully will continue to trend down to allow coronary eval Will need secondary prevention ICD prior to discharge Continue amiodarone    In my review of EMS record Their INITIAL rhythm was VF >> defibrillations, meds > >> PEA >> more EPI > ROSC  Mertha Abrahams, PA-C

## 2024-03-06 LAB — GLUCOSE, CAPILLARY
Glucose-Capillary: 103 mg/dL — ABNORMAL HIGH (ref 70–99)
Glucose-Capillary: 106 mg/dL — ABNORMAL HIGH (ref 70–99)
Glucose-Capillary: 107 mg/dL — ABNORMAL HIGH (ref 70–99)
Glucose-Capillary: 112 mg/dL — ABNORMAL HIGH (ref 70–99)
Glucose-Capillary: 87 mg/dL (ref 70–99)
Glucose-Capillary: 89 mg/dL (ref 70–99)
Glucose-Capillary: 91 mg/dL (ref 70–99)

## 2024-03-06 LAB — BASIC METABOLIC PANEL WITH GFR
Anion gap: 11 (ref 5–15)
BUN: 30 mg/dL — ABNORMAL HIGH (ref 6–20)
CO2: 22 mmol/L (ref 22–32)
Calcium: 8.9 mg/dL (ref 8.9–10.3)
Chloride: 104 mmol/L (ref 98–111)
Creatinine, Ser: 2.54 mg/dL — ABNORMAL HIGH (ref 0.44–1.00)
GFR, Estimated: 23 mL/min — ABNORMAL LOW (ref >60)
Glucose, Bld: 93 mg/dL (ref 70–99)
Potassium: 4.3 mmol/L (ref 3.5–5.1)
Sodium: 137 mmol/L (ref 135–145)

## 2024-03-06 LAB — CBC
HCT: 38.7 % (ref 36.0–46.0)
Hemoglobin: 12.7 g/dL (ref 12.0–15.0)
MCH: 29.5 pg (ref 26.0–34.0)
MCHC: 32.8 g/dL (ref 30.0–36.0)
MCV: 89.8 fL (ref 80.0–100.0)
Platelets: 212 10*3/uL (ref 150–400)
RBC: 4.31 MIL/uL (ref 3.87–5.11)
RDW: 14 % (ref 11.5–15.5)
WBC: 7.3 10*3/uL (ref 4.0–10.5)
nRBC: 0 % (ref 0.0–0.2)

## 2024-03-06 MED ORDER — CARVEDILOL 12.5 MG PO TABS
12.5000 mg | ORAL_TABLET | Freq: Two times a day (BID) | ORAL | Status: DC
  Administered 2024-03-06 – 2024-03-12 (×12): 12.5 mg via ORAL
  Filled 2024-03-06 (×12): qty 1

## 2024-03-06 MED ORDER — HALOPERIDOL LACTATE 5 MG/ML IJ SOLN
5.0000 mg | Freq: Four times a day (QID) | INTRAMUSCULAR | Status: DC | PRN
  Administered 2024-03-06: 5 mg via INTRAVENOUS
  Filled 2024-03-06: qty 1

## 2024-03-06 MED ORDER — AMIODARONE HCL 200 MG PO TABS
400.0000 mg | ORAL_TABLET | Freq: Two times a day (BID) | ORAL | Status: DC
  Administered 2024-03-06 – 2024-03-12 (×12): 400 mg via ORAL
  Filled 2024-03-06 (×13): qty 2

## 2024-03-06 MED ORDER — HYDRALAZINE HCL 25 MG PO TABS
25.0000 mg | ORAL_TABLET | Freq: Once | ORAL | Status: AC
  Administered 2024-03-06: 25 mg via ORAL
  Filled 2024-03-06: qty 1

## 2024-03-06 MED ORDER — MAGNESIUM SULFATE 2 GM/50ML IV SOLN
2.0000 g | Freq: Once | INTRAVENOUS | Status: AC
  Administered 2024-03-06: 2 g via INTRAVENOUS
  Filled 2024-03-06: qty 50

## 2024-03-06 MED ORDER — HYDRALAZINE HCL 50 MG PO TABS
50.0000 mg | ORAL_TABLET | Freq: Three times a day (TID) | ORAL | Status: DC
  Administered 2024-03-06 – 2024-03-12 (×19): 50 mg via ORAL
  Filled 2024-03-06 (×19): qty 1

## 2024-03-06 MED ORDER — AMLODIPINE BESYLATE 10 MG PO TABS
10.0000 mg | ORAL_TABLET | Freq: Every day | ORAL | Status: DC
  Administered 2024-03-06 – 2024-03-12 (×6): 10 mg via ORAL
  Filled 2024-03-06 (×7): qty 1

## 2024-03-06 NOTE — Progress Notes (Addendum)
 PROGRESS NOTE  Mandy Serrano WUJ:[id number] DOB: [birth year] DOA: 03/02/2024 PCP: Pcp, No   LOS: 4 days   Brief narrative:  Patient is a 48 y.o. with history of obesity, HTN  was on 5 anti-HTN medicines in [place]), sickle cell anemia, systolic heart failure 05/2016, headaches, and frequent PVCs was admitted to the hospital on previously in 2017 with dyspnea and hypertension.  She had not been taking any medications for the last 1 year.   Most recent EF was 60 to 65% and was last seen in cardiology office on 18th subsequently patient was found to be unresponsive with downtime of 12 min prior to CPR.   She was coded for 52 min, mostly PEA with periods of V Tach and V fib.  She was shocked several times and she was given multiple rounds of Epinephrine IVP then started on Epinephrine drip in ED and vasopressin was added..  She is intubated and poorly responsive.  Patient was admitted to the hospital in the ICU setting.  Patient was subsequently extubated on 03/03/2024.  Subsequently was transferred out of the ICU.  Assessment/Plan: Principal Problem:   Cardiac arrest [facility]) Active Problems:   Shock (HCC)   Acute respiratory failure with hypoxia (HCC)   AKI (acute kidney injury) (HCC)  Cardiac arrest with ROSC.   Initial rhythm was PEA but then subsequently had VF and VT and needed shocks.  Was initially in the ICU and currently stable and extubated.  Echocardiogram showed EF of 55 to 60% but has history of PVCs on flecainide .  PVCs were thought to be contributing to her cardiomyopathy.  Currently on amiodarone  drip.  Electrophysiology cardiology on board.  Plan for cardiac catheterization but creatinine is still elevated.  Continue aspirin   Agitation confusion overnight.  Was started on Ativan p.o. and needed Haldol with restraints.  This morning patient is off restraints and is alert awake and Communicative.  Appears to be calm.  Acute hypoxic respiratory failure secondary to possible aspiration  pneumonia.  Completed course of Unasyn for 5 days.  Currently on room air and has improved  Acute kidney injury.    Renal ultrasound was negative.  Latest creatinine of 2.5 from 2.8 f< 3.2.  BMP in AM.  Improving.  Congestive heart failure with recovered ejection.  Not volume overloaded.  History of resistant hypertension.  Was on multiple medications in the past but was not taking medications.  Unable to use spironolactone  or losartan  at this time due to AKI.  Continue Coreg  and hydralazine .  Significant PVC burden.  On amiodarone  drip at this time.  Electrophysiology cardiology on board.  Hypomagnesemia.  Magnesium  level of 1.6 today.  Will replace with 2 g of IV magnesium  sulfate..  Check levels in AM.  Class II obesity. Body mass index is 39.34 kg/m.  Would benefit from lifestyle modification and ongoing weight loss.  History of sickle cell anemia.  Not in acute exacerbation.  Will continue to monitor CBC.  Elevated LFTs due to shock liver.  Will continue to monitor.  Check LFTs in AM.  Hyperglycemia.  Latest A1c of 5.8.  On sliding scale insulin.  Latest POC glucose of 107   DVT prophylaxis: heparin  injection 5,000 Units Start: 03/02/24 0600 SCDs Start: 03/02/24 0207   Disposition: Physical therapy has recommended CIR.  Status is: Inpatient  Remains inpatient appropriate because: Pending cardiac workup, renal failure, status post cardiac arrest, need for rehabilitation    Code Status:     Code Status: Full Code  Family Communication: Spoke with the patient's spouse at bedside on 03/05/2024.  Consultants: Cardiology Electrophysiology cardiology PCCM  Procedures: CPR with shocks. Intubation and mechanical ventilation with subsequent extubation,  Anti-infectives:  Unasyn -completed course.  Anti-infectives (From admission, onward)    Start     Dose/Rate Route Frequency Ordered Stop   03/03/24 2100  Ampicillin-Sulbactam (UNASYN) 3 g in sodium chloride  0.9 % 100  mL IVPB        3 g 200 mL/hr over 30 Minutes Intravenous Every 12 hours 03/03/24 0814 03/04/24 2222   03/02/24 0300  Ampicillin-Sulbactam (UNASYN) 3 g in sodium chloride  0.9 % 100 mL IVPB  Status:  Discontinued        3 g 200 mL/hr over 30 Minutes Intravenous Every 6 hours 03/02/24 0245 03/03/24 0814   03/02/24 0215  cefTRIAXone (ROCEPHIN) 1 g in sodium chloride  0.9 % 100 mL IVPB  Status:  Discontinued        1 g 200 mL/hr over 30 Minutes Intravenous Every 24 hours 03/02/24 0210 03/02/24 0244       Subjective: Today, patient was seen and examined at bedside.  Today, patient denies any pain, nausea, vomiting, shortness of breath.  Patient had episodes of confusion agitation overnight and required brief restraints and Haldol.  Appears to be calm at the time of my interview.   Objective: Vitals:   03/06/24 0530 03/06/24 0757  BP: (!) 145/94 (!) 160/98  Pulse:  77  Resp:  16  Temp:  99.2 F (37.3 C)  SpO2:  99%    Intake/Output Summary (Last 24 hours) at 03/06/2024 1049 Last data filed at 03/05/2024 2334 Gross per 24 hour  Intake 585.68 ml  Output --  Net 585.68 ml   Filed Weights   03/04/24 0500 03/05/24 0548 03/06/24 0500  Weight: 101.5 kg 105.2 kg 104 kg   Body mass index is 39.34 kg/m.   Physical Exam:  GENERAL: Patient is alert awake and Communicative, not in obvious distress.  Obese build.  Calm appearing. HENT: No scleral pallor or icterus. Pupils equally reactive to light. Oral mucosa is moist NECK: is supple, no gross swelling noted. CHEST: Clear to auscultation. No crackles or wheezes.  Chest wall dressing noted CVS: S1 and S2 heard, no murmur. Regular rate and rhythm.  ABDOMEN: Soft, non-tender, bowel sounds are present. EXTREMITIES: No edema. CNS: Cranial nerves are intact. No focal motor deficits. SKIN: warm and dry without rashes.  Data Review: I have personally reviewed the following laboratory data and studies,  CBC: Recent Labs  Lab 03/02/24 0036  03/02/24 0115 03/02/24 0359 03/02/24 0610 03/03/24 0437 03/04/24 0524 03/05/24 0335  WBC 27.5*  --  23.8*  --  11.3* 10.1 10.7*  NEUTROABS 15.7*  --   --   --   --   --   --   HGB 12.1   < > 12.3 12.6 11.2* 10.4* 11.3*  HCT 40.1   < > 39.0 37.0 34.9* 31.9* 35.3*  MCV 100.5*  --  96.1  --  93.1 92.5 91.2  PLT 281  --  320  --  193 169 191   < > = values in this interval not displayed.   Basic Metabolic Panel: Recent Labs  Lab 03/02/24 0359 03/02/24 0610 03/02/24 1725 03/03/24 0437 03/03/24 1640 03/03/24 1750 03/04/24 0524 03/05/24 0335  NA 137   < > 138 137  --  138 138 137  K 5.4*   < > 4.1 3.6  --  4.0 3.9 4.4  CL 105   < > 105 104  --  103 106 106  CO2 20*   < > 22 24  --  23 24 22   GLUCOSE 138*   < > 158* 192*  --  149* 101* 102*  BUN 34*   < > 41* 48*  --  50* 48* 39*  CREATININE 1.90*  1.91*   < > 2.56* 3.02*  --  3.23* 3.20* 2.82*  CALCIUM 8.7*   < > 8.3* 8.0*  --  7.8* 7.8* 8.2*  MG 2.0  --   --  1.6* 1.1* 2.1 1.9 1.9  PHOS 8.6*  --   --  3.1  --   --   --   --    < > = values in this interval not displayed.   Liver Function Tests: Recent Labs  Lab 03/02/24 0036 03/03/24 0437 03/04/24 0524 03/05/24 0335  AST 252* 93* 54* 42*  ALT 209* 141* 108* 83*  ALKPHOS 66 46 41 62  BILITOT 0.6 0.3 0.4 0.4  PROT 6.5 5.7* 6.0* 6.7  ALBUMIN 3.0* 2.5* 2.5* 3.0*   No results for input(s): "LIPASE", "AMYLASE" in the last 168 hours. No results for input(s): "AMMONIA" in the last 168 hours. Cardiac Enzymes: No results for input(s): "CKTOTAL", "CKMB", "CKMBINDEX", "TROPONINI" in the last 168 hours. BNP (last 3 results) Recent Labs    12/17/23 1131 01/02/24 1559 03/02/24 0359  BNP 133.3* 19.0 137.1*    ProBNP (last 3 results) No results for input(s): "PROBNP" in the last 8760 hours.  CBG: Recent Labs  Lab 03/05/24 1953 03/06/24 0018 03/06/24 0436 03/06/24 0531 03/06/24 0752  GLUCAP 87 112* 106* 107* 89   Recent Results (from the past 240 hours)  MRSA  Next Gen by PCR, Nasal     Status: None   Collection Time: 03/02/24  2:07 AM   Specimen: Nasal Mucosa; Nasal Swab  Result Value Ref Range Status   MRSA by PCR Next Gen NOT DETECTED NOT DETECTED Final    Comment: (NOTE) The GeneXpert MRSA Assay (FDA approved for NASAL specimens only), is one component of a comprehensive MRSA colonization surveillance program. It is not intended to diagnose MRSA infection nor to guide or monitor treatment for MRSA infections. Test performance is not FDA approved in patients less than 45 years old. Performed at [facility], [address], [place], [place] [address]   Culture, blood (Routine X 2) w Reflex to ID Panel     Status: None (Preliminary result)   Collection Time: 03/02/24  3:57 AM   Specimen: BLOOD  Result Value Ref Range Status   Specimen Description BLOOD SITE NOT SPECIFIED  Final   Special Requests   Final    BOTTLES DRAWN AEROBIC AND ANAEROBIC Blood Culture results may not be optimal due to an inadequate volume of blood received in culture bottles   Culture   Final    NO GROWTH 4 DAYS Performed at [facility], [address] [place], [place] [address]    Report Status PENDING  Incomplete  Culture, blood (Routine X 2) w Reflex to ID Panel     Status: None (Preliminary result)   Collection Time: 03/02/24  3:58 AM   Specimen: BLOOD  Result Value Ref Range Status   Specimen Description BLOOD SITE NOT SPECIFIED  Final   Special Requests   Final    BOTTLES DRAWN AEROBIC AND ANAEROBIC Blood Culture results may not be  optimal due to an inadequate volume of blood received in culture bottles   Culture   Final    NO GROWTH 4 DAYS Performed at [facility], [address], [place], [place] [address]    Report Status PENDING  Incomplete  MRSA Next Gen by PCR, Nasal     Status: None   Collection Time: 03/02/24  6:15 AM   Specimen: Nasal Mucosa; Nasal Swab  Result Value Ref Range Status   MRSA by PCR Next Gen NOT DETECTED  NOT DETECTED Final    Comment: (NOTE) The GeneXpert MRSA Assay (FDA approved for NASAL specimens only), is one component of a comprehensive MRSA colonization surveillance program. It is not intended to diagnose MRSA infection nor to guide or monitor treatment for MRSA infections. Test performance is not FDA approved in patients less than 41 years old. Performed at [facility], [address], [place], [place] [address]      Studies: No results found.     Adamariz Gillott, MD  [facility] 03/06/2024  If 7PM-7AM, please contact night-coverage

## 2024-03-06 NOTE — Progress Notes (Signed)
 [facility] night cross cover note:  I was notified by RN that this patient is confused, agitated, repeatedly attempting to and successfully getting out of bed with these behaviors refractory to attempts at verbal redirection.  In the setting of associated interference with ongoing medical treatment posing potential harm to themself, including increased fall risk, I have placed orders for prn iv haldol for agitation as well as order for soft bilateral wrist restraints.    Camelia Cavalier, DO Hospitalist

## 2024-03-06 NOTE — Progress Notes (Signed)
 Dr. Efrain Grant notified pt's BP elevated 180/121. Pt is asymptomatic, given scheduled  dose of PO hydralazine , MD reordered home BP medications at this time.

## 2024-03-06 NOTE — Progress Notes (Signed)
 Patient continues to be confused. Getting up multiple times to void. Patient is impulsive not listening. forgetful  Patient asking where her phone is. Myself and NT looked in room, sheets and patient's belongings, phone is not in room.  Will continue to monitor and look for phone

## 2024-03-06 NOTE — Progress Notes (Signed)
 Mobility Specialist Progress Note:    03/06/24 1557  Mobility  Activity Ambulated with assistance in hallway;Ambulated with assistance in room  Level of Assistance Modified independent, requires aide device or extra time  Assistive Device Other (Comment) (hand rails)  Distance Ambulated (ft) 250 ft  Activity Response Tolerated well  Mobility Referral Yes  Mobility visit 1 Mobility  Mobility Specialist Start Time (ACUTE ONLY) 1550  Mobility Specialist Stop Time (ACUTE ONLY) 1557  Mobility Specialist Time Calculation (min) (ACUTE ONLY) 7 min   Pt received in bed, agreeable to mobility session. Ambulated in hallway with ModI using hand rails. Tolerated well, persistent coughing throughout. Returned pt back to room, sitting up in chair with daughter and sitter at bedside. All needs met.    Keeana Pieratt Mobility Specialist Please contact via Special educational needs teacher or  Rehab office at [phone]

## 2024-03-06 NOTE — Progress Notes (Signed)
 Telemetry remains unchanged PVC burden though perhaps is less AMS/agitation last night No labs today (yet) Suspect she wont be ready for ICD until next week Wee will follow alone from [place] for now Call if needed  Mertha Abrahams, PA-C

## 2024-03-06 NOTE — Progress Notes (Addendum)
 Advanced Heart Failure Rounding Note  Cardiologist: None   Chief Complaint: Out of hospital cardiac arrest  Subjective:    Agitated overnight and required IV haldol.   Remains on IV amiodarone  at 30 mg/hr. Rhythm is sinus with PVCs. No VT overnight.  No chest pain or shortness of breath. Feels fine.  Ambulated with PT yesterday.   Objective:   Weight Range: 104 kg Body mass index is 39.34 kg/m.   Vital Signs:   Temp:  [97.8 F (36.6 C)-99.3 F (37.4 C)] 99.2 F (37.3 C) (05/15 0757) Pulse Rate:  [71-87] 77 (05/15 0757) Resp:  [16-24] 16 (05/15 0757) BP: (145-179)/(70-98) 160/98 (05/15 0757) SpO2:  [96 %-99 %] 99 % (05/15 0757) Weight:  [104 kg] 104 kg (05/15 0500) Last BM Date : 03/04/24  Weight change: Filed Weights   03/04/24 0500 03/05/24 0548 03/06/24 0500  Weight: 101.5 kg 105.2 kg 104 kg    Intake/Output:   Intake/Output Summary (Last 24 hours) at 03/06/2024 0934 Last data filed at 03/05/2024 2334 Gross per 24 hour  Intake 585.68 ml  Output --  Net 585.68 ml      Physical Exam    General:  No distress. Sitting up in bed. Neck: no JVD.  Cor: Regular rate & rhythm. No rubs, gallops or murmurs. Lungs: clear Abdomen: soft, nontender, nondistended.  Extremities: no edema Neuro: alert & orientedx3. Affect pleasant   Telemetry   SR with PVCs, no VT  EKG    N/A  Labs    CBC Recent Labs    03/04/24 0524 03/05/24 0335  WBC 10.1 10.7*  HGB 10.4* 11.3*  HCT 31.9* 35.3*  MCV 92.5 91.2  PLT 169 191   Basic Metabolic Panel Recent Labs    29/56/21 0524 03/05/24 0335  NA 138 137  K 3.9 4.4  CL 106 106  CO2 24 22  GLUCOSE 101* 102*  BUN 48* 39*  CREATININE 3.20* 2.82*  CALCIUM 7.8* 8.2*  MG 1.9 1.9   Liver Function Tests Recent Labs    03/04/24 0524 03/05/24 0335  AST 54* 42*  ALT 108* 83*  ALKPHOS 41 62  BILITOT 0.4 0.4  PROT 6.0* 6.7  ALBUMIN 2.5* 3.0*   No results for input(s): "LIPASE", "AMYLASE" in the last 72  hours. Cardiac Enzymes No results for input(s): "CKTOTAL", "CKMB", "CKMBINDEX", "TROPONINI" in the last 72 hours.  BNP: BNP (last 3 results) Recent Labs    12/17/23 1131 01/02/24 1559 03/02/24 0359  BNP 133.3* 19.0 137.1*    ProBNP (last 3 results) No results for input(s): "PROBNP" in the last 8760 hours.   D-Dimer No results for input(s): "DDIMER" in the last 72 hours. Hemoglobin A1C No results for input(s): "HGBA1C" in the last 72 hours.  Fasting Lipid Panel No results for input(s): "CHOL", "HDL", "LDLCALC", "TRIG", "CHOLHDL", "LDLDIRECT" in the last 72 hours.  Thyroid  Function Tests No results for input(s): "TSH", "T4TOTAL", "T3FREE", "THYROIDAB" in the last 72 hours.  Invalid input(s): "FREET3"  Other results:   Imaging    No results found.    Medications:     Scheduled Medications:  aspirin   81 mg Oral Daily   docusate sodium  100 mg Oral BID   heparin   5,000 Units Subcutaneous Q8H   hydrALAZINE   25 mg Oral Q8H   insulin aspart  2-6 Units Subcutaneous TID WC   polyethylene glycol  17 g Oral Daily    Infusions:  amiodarone  30 mg/hr (03/06/24 0203)  PRN Medications: haloperidol lactate, LORazepam, ondansetron  (ZOFRAN ) IV, mouth rinse, polyethylene glycol    Patient Profile   48 y.o. with history of systolic heart failure in 2017, felt PVC mediated. PVCs suppressed w/ Flecainide  and EF improved/normalized. Also w/ HTN, sickle cell anemia, admitted post OOH VF arrest.   Assessment/Plan   1. OOH VF arrest: At least 52 minutes to ROSC.  Initial rhythm was VF per EP review of EMS record.   - She has recovered surprisingly well, now extubated and conversant.  Intermittently confused/agitated. - Echo EF 55-60% with moderate LVH, normal RV, IVC normal.   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   - Remains on IV amiodarone , will discuss timing of transition to PO with EP. Check thyroid   function tests. - EP following. They recommend evaluation of coronaries, LHC vs CTA, when renal fx normalizes. AKI improving but SCr has been elevated above 2. Awaiting today's labs. - Meets criteria for secondary prevention ICD prior to discharge but not medically ready 2. 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  LVH on echo c/w HTN.  Prior cardiac MRI with no delayed enhancement.  She is not volume overloaded on exam and IVC small on echo. - Continue PVC suppression w/ amiodarone   - Keep BP controlled.I Increase hydralazine  to 50 TID. 3. HTN: History of resistant HTN.  Has been off meds post-arrest. SBPs now elevated  - With AKI, would stay off spironolactone  and losartan .  - Increase hydralazine  as above 4. AKI: Suspect ATN due to cardiac arrest/shock. SCr peaked at 3.2 but showing s/o recovery.  - Cr improved to 2.8 yesterday, awaiting today's labs 5. PVCs: Long history of frequent PVCs, thought to contribute to prior cardiomyopathy.  She was initially on amiodarone  then switched to flecainide  when EF recovered.  - Amiodarone  gtt for now. See discussion above 6. Hypomagnesemia: Mg 1.9 5/14.  - Supplemented - Awaiting AM labs  Length of Stay: 4  FINCH, LINDSAY N, PA-C  03/06/2024, 9:34 AM  Advanced Heart Failure Team Pager [phone] (M-F; 7a - 5p)  Please contact [facility] for night-coverage after hours (5p -7a ) and weekends on [email or web address]   Patient seen with PA, I formulated the plan and agree with the above note.   No further VT.  Creatinine trending down, now 2.54.  No complaints.   General: NAD Neck: No JVD, no thyromegaly or thyroid  nodule.  Lungs: Clear to auscultation bilaterally with normal respiratory effort. CV: Nondisplaced PMI.  Heart regular S1/S2, no S3/S4, no murmur.  No peripheral edema.   Abdomen: Soft, nontender, no  hepatosplenomegaly, no distention.  Skin: Intact without lesions or rashes.  Neurologic: Alert and oriented x 3.  Psych: Normal affect. Extremities: No clubbing or cyanosis.  HEENT: Normal.   Can transition amiodarone  to po.  She will need secondary prevention ICD with EP, plan noted for next week.   Creatinine starting to trend down, 2.54 today.  Baseline around 0.9.  Will aim for cath with creatinine closer to baseline (< 1.5).    Increase hydralazine  as above with BP mildly elevated. Continue to hold losartan  and spironolactone  with elevated creatinine.   We will follow at a distance for now, when creatinine < 1.5 (closer to baseline), can proceed with cath vs coronary CTA.   Peder Bourdon 03/06/2024 11:58 AM

## 2024-03-06 NOTE — Progress Notes (Signed)
 Occupational Therapy Treatment Patient Details Name: Mandy Serrano MRN: [id number] DOB: May 09, 1976 Today's Date: 03/06/2024   History of present illness 48 yo female admitted 5/11 after found down approximately prior to CPR of 52 minutes. Intubated 5/11-5/12. 5/12 EEG with global cerebral dysfunction. PMhx: HTN, CHF, obesity, PVC, sickle cell anemia.   OT comments  Pt eager to get OOB. Supervision for bed mobility. Donned front opening gown and socks with set up. Ambulated to bathroom with CGA and to sink for 3 grooming activities. Continues to demonstrate memory deficits, decreased awareness of deficits and disorientation to time. Educated in use of wall calendar for orientation and to use pillow to splint chest when coughing. AIR no longer following, updated d/c recommendation to OPOT.      If plan is discharge home, recommend the following:  A little help with walking and/or transfers;Assistance with cooking/housework;Direct supervision/assist for financial management;Direct supervision/assist for medications management;Assist for transportation;Help with stairs or ramp for entrance;A little help with bathing/dressing/bathroom   Equipment Recommendations  Tub/shower seat    Recommendations for Other Services      Precautions / Restrictions Precautions Precautions: Fall Recall of Precautions/Restrictions: Impaired Restrictions Weight Bearing Restrictions Per Provider Order: No       Mobility Bed Mobility Overal bed mobility: Needs Assistance Bed Mobility: Supine to Sit, Sit to Supine     Supine to sit: Supervision Sit to supine: Supervision   General bed mobility comments: supervision for IV line    Transfers Overall transfer level: Needs assistance Equipment used: None Transfers: Sit to/from Stand Sit to Stand: Contact guard assist           General transfer comment: from bed and toilet     Balance Overall balance assessment: Needs  assistance Sitting-balance support: No upper extremity supported, Feet supported Sitting balance-Leahy Scale: Good Sitting balance - Comments: no LOB donning socks   Standing balance support: No upper extremity supported Standing balance-Leahy Scale: Fair Standing balance comment: CGA in room/bathroom                           ADL either performed or assessed with clinical judgement   ADL Overall ADL's : Needs assistance/impaired     Grooming: Contact guard assist;Standing;Wash/dry hands;Wash/dry face;Oral care Grooming Details (indicate cue type and reason): sequencing well, cues to use bath towel, used cold water on face         Upper Body Dressing : Set up;Sitting   Lower Body Dressing: Set up;Sitting/lateral leans   Toilet Transfer: Contact guard assist;Ambulation   Toileting- Clothing Manipulation and Hygiene: Set up;Sitting/lateral lean       Functional mobility during ADLs: Contact guard assist      Extremity/Trunk Assessment              Vision       Perception     Praxis     Communication Communication Communication: Other (comment) (needed some statements repeated) Factors Affecting Communication: Other (comment) (speaking and understanding English, some repetition needed)   Cognition Arousal: Alert Behavior During Therapy: WFL for tasks assessed/performed Cognition: Cognition impaired   Orientation impairments: Time, Situation Awareness: Online awareness impaired, Intellectual awareness impaired Memory impairment (select all impairments): Short-term memory, Working memory Attention impairment (select first level of impairment): Selective attention Executive functioning impairment (select all impairments): Problem solving, Reasoning, Sequencing OT - Cognition Comments: pt sequencing toileting and grooming without assist, did not recall that she had her own toothbrush and toothpaste  here                 Following commands:  Intact        Cueing      Exercises      Shoulder Instructions       General Comments      Pertinent Vitals/ Pain       Pain Assessment Pain Assessment: Faces Faces Pain Scale: Hurts a little bit Pain Location: chest with coughing Pain Descriptors / Indicators: Discomfort, Grimacing Pain Intervention(s): Monitored during session, Other (comment) (instructed to use pillow as splint)  Home Living                                          Prior Functioning/Environment              Frequency  Min 2X/week        Progress Toward Goals  OT Goals(current goals can now be found in the care plan section)  Progress towards OT goals: Progressing toward goals  Acute Rehab OT Goals OT Goal Formulation: With patient Time For Goal Achievement: 03/18/24 Potential to Achieve Goals: Good  Plan      Co-evaluation                 AM-PAC OT "6 Clicks" Daily Activity     Outcome Measure   Help from another person eating meals?: None Help from another person taking care of personal grooming?: A Little Help from another person toileting, which includes using toliet, bedpan, or urinal?: A Little Help from another person bathing (including washing, rinsing, drying)?: A Lot Help from another person to put on and taking off regular upper body clothing?: A Little Help from another person to put on and taking off regular lower body clothing?: A Little 6 Click Score: 18    End of Session Equipment Utilized During Treatment: Gait belt  OT Visit Diagnosis: Unsteadiness on feet (R26.81);Other abnormalities of gait and mobility (R26.89);Muscle weakness (generalized) (M62.81);Other symptoms and signs involving cognitive function   Activity Tolerance Patient tolerated treatment well   Patient Left in bed;with call bell/phone within reach;with bed alarm set   Nurse Communication          Time: 1009-1030 OT Time Calculation (min): 21 min  Charges: OT  General Charges $OT Visit: 1 Visit OT Treatments $Self Care/Home Management : 8-22 mins  Mandy Serrano, OTR/L Acute Rehabilitation Services Office: [phone]  Jonette Nestle 03/06/2024, 12:32 PM

## 2024-03-06 NOTE — Plan of Care (Signed)
  Problem: Coping: Goal: Ability to adjust to condition or change in health will improve Outcome: Not Progressing   Problem: Health Behavior/Discharge Planning: Goal: Ability to manage health-related needs will improve Outcome: Not Progressing   Problem: Clinical Measurements: Goal: Ability to maintain clinical measurements within normal limits will improve Outcome: Not Progressing

## 2024-03-07 ENCOUNTER — Other Ambulatory Visit: Payer: Self-pay

## 2024-03-07 ENCOUNTER — Encounter (HOSPITAL_COMMUNITY): Payer: Self-pay

## 2024-03-07 LAB — COMPREHENSIVE METABOLIC PANEL WITH GFR
ALT: 55 U/L — ABNORMAL HIGH (ref 0–44)
AST: 36 U/L (ref 15–41)
Albumin: 3.1 g/dL — ABNORMAL LOW (ref 3.5–5.0)
Alkaline Phosphatase: 55 U/L (ref 38–126)
Anion gap: 11 (ref 5–15)
BUN: 30 mg/dL — ABNORMAL HIGH (ref 6–20)
CO2: 22 mmol/L (ref 22–32)
Calcium: 8.7 mg/dL — ABNORMAL LOW (ref 8.9–10.3)
Chloride: 102 mmol/L (ref 98–111)
Creatinine, Ser: 2.42 mg/dL — ABNORMAL HIGH (ref 0.44–1.00)
GFR, Estimated: 24 mL/min — ABNORMAL LOW (ref >60)
Glucose, Bld: 93 mg/dL (ref 70–99)
Potassium: 4.3 mmol/L (ref 3.5–5.1)
Sodium: 135 mmol/L (ref 135–145)
Total Bilirubin: 0.7 mg/dL (ref 0.0–1.2)
Total Protein: 6.7 g/dL (ref 6.5–8.1)

## 2024-03-07 LAB — GLUCOSE, CAPILLARY
Glucose-Capillary: 105 mg/dL — ABNORMAL HIGH (ref 70–99)
Glucose-Capillary: 104 mg/dL — ABNORMAL HIGH (ref 70–99)
Glucose-Capillary: 102 mg/dL — ABNORMAL HIGH (ref 70–99)

## 2024-03-07 LAB — CBC
HCT: 36.5 % (ref 36.0–46.0)
Hemoglobin: 12 g/dL (ref 12.0–15.0)
MCH: 29.6 pg (ref 26.0–34.0)
MCHC: 32.9 g/dL (ref 30.0–36.0)
MCV: 89.9 fL (ref 80.0–100.0)
Platelets: 246 10*3/uL (ref 150–400)
RBC: 4.06 MIL/uL (ref 3.87–5.11)
RDW: 14.2 % (ref 11.5–15.5)
WBC: 8.7 10*3/uL (ref 4.0–10.5)
nRBC: 0 % (ref 0.0–0.2)

## 2024-03-07 LAB — CULTURE, BLOOD (ROUTINE X 2)
Culture: NO GROWTH
Culture: NO GROWTH

## 2024-03-07 LAB — TSH: TSH: 5.637 u[IU]/mL — ABNORMAL HIGH (ref 0.350–4.500)

## 2024-03-07 LAB — T4, FREE: Free T4: 1.37 ng/dL — ABNORMAL HIGH (ref 0.61–1.12)

## 2024-03-07 LAB — MAGNESIUM: Magnesium: 2 mg/dL (ref 1.7–2.4)

## 2024-03-07 MED ORDER — MENTHOL 3 MG MT LOZG
1.0000 | LOZENGE | OROMUCOSAL | Status: DC | PRN
  Administered 2024-03-07 – 2024-03-08 (×4): 3 mg via ORAL
  Filled 2024-03-07 (×2): qty 9

## 2024-03-07 NOTE — Progress Notes (Signed)
 Physical Therapy Treatment Patient Details Name: Mandy Serrano MRN: [id number] DOB: [birth year] Today's Date: 03/07/2024   History of Present Illness 48 yo female admitted 5/11 after found down approximately prior to CPR of 52 minutes. Intubated 5/11-5/12. 5/12 EEG with global cerebral dysfunction. PMhx: HTN, CHF, obesity, PVC, sickle cell anemia.    PT Comments  Pt up in chair on arrival and agreeable to session with good progress towards acute goals. Pt able to progress gait without AD support with no over LOB noted and pt without need to reach for hallway rail to self steady. Pt able to accept mild balance challenges with head turns with mild postural sway however no overt LOB noted. Challenged pt with cognitive dual tasking with pt having to come to complete stop during gait to attempt. Pt continues to benefit from skilled PT services to progress toward functional mobility goals.     If plan is discharge home, recommend the following: A little help with walking and/or transfers;A little help with bathing/dressing/bathroom;Assistance with cooking/housework;Direct supervision/assist for medications management;Direct supervision/assist for financial management;Assist for transportation;Help with stairs or ramp for entrance;Supervision due to cognitive status   Can travel by private vehicle        Equipment Recommendations  Rolling walker (2 wheels) (pending progress)    Recommendations for Other Services       Precautions / Restrictions Precautions Precautions: Fall Recall of Precautions/Restrictions: Impaired Restrictions Weight Bearing Restrictions Per Provider Order: No     Mobility  Bed Mobility Overal bed mobility: Needs Assistance             General bed mobility comments: up in chair on arrival    Transfers Overall transfer level: Needs assistance Equipment used: None Transfers: Sit to/from Stand Sit to Stand: Contact guard assist           General  transfer comment: CGA for safety    Ambulation/Gait Ambulation/Gait assistance: Contact guard assist Gait Distance (Feet): 470 Feet Assistive device: None Gait Pattern/deviations: Step-through pattern, Drifts right/left, Decreased stride length Gait velocity: decr     General Gait Details: CGA for safety, able to accept mild balance challenges with mild postural sway and some drfit but no overt LOB, poor ability to dual taks with pt needing to stop gait completely to attempt alphabet backwards   Stairs             Wheelchair Mobility     Tilt Bed    Modified Rankin (Stroke Patients Only)       Balance Overall balance assessment: Needs assistance Sitting-balance support: No upper extremity supported, Feet supported Sitting balance-Leahy Scale: Good Sitting balance - Comments: no LOB donning socks   Standing balance support: No upper extremity supported Standing balance-Leahy Scale: Fair Standing balance comment: CGA in room/bathroom                            Communication Communication Communication: Other (comment) (needed some statements repeated) Factors Affecting Communication: Other (comment) (speaking and understanding English, some repetition needed)  Cognition Arousal: Alert Behavior During Therapy: WFL for tasks assessed/performed   PT - Cognitive impairments: Orientation, Safety/Judgement   Orientation impairments: Time, Situation                   PT - Cognition Comments: continues to be disoriented to time, stating year as 2002 Following commands: Intact Following commands impaired: Follows one step commands with increased time    Cueing Cueing  Techniques: Verbal cues, Gestural cues, Tactile cues, Visual cues  Exercises      General Comments General comments (skin integrity, edema, etc.): VSS on RA, mother present throughout session      Pertinent Vitals/Pain Pain Assessment Pain Assessment: Faces Faces Pain Scale: No  hurt Pain Intervention(s): Monitored during session    Home Living                          Prior Function            PT Goals (current goals can now be found in the care plan section) Acute Rehab PT Goals Patient Stated Goal: To go home with family PT Goal Formulation: With family Time For Goal Achievement: 03/17/24 Progress towards PT goals: Progressing toward goals    Frequency    Min 3X/week      PT Plan      Co-evaluation              AM-PAC PT "6 Clicks" Mobility   Outcome Measure  Help needed turning from your back to your side while in a flat bed without using bedrails?: A Little Help needed moving from lying on your back to sitting on the side of a flat bed without using bedrails?: A Little Help needed moving to and from a bed to a chair (including a wheelchair)?: A Little Help needed standing up from a chair using your arms (e.g., wheelchair or bedside chair)?: A Little Help needed to walk in hospital room?: A Little Help needed climbing 3-5 steps with a railing? : None 6 Click Score: 19    End of Session   Activity Tolerance: Patient tolerated treatment well Patient left: with call bell/phone within reach;in chair;with family/visitor present Nurse Communication: Mobility status PT Visit Diagnosis: Muscle weakness (generalized) (M62.81);Difficulty in walking, not elsewhere classified (R26.2)     Time: 1004-1016 PT Time Calculation (min) (ACUTE ONLY): 12 min  Charges:    $Gait Training: 8-22 mins PT General Charges $$ ACUTE PT VISIT: 1 Visit                     Jahson Emanuele R. PTA Acute Rehabilitation Services Office: [phone]   Agapito Horseman 03/07/2024, 10:26 AM

## 2024-03-07 NOTE — Progress Notes (Signed)
 Mobility Specialist Progress Note:    03/07/24 0905  Mobility  Activity Ambulated independently in hallway  Level of Assistance Contact guard assist, steadying assist  Assistive Device None  Distance Ambulated (ft) 470 ft  Activity Response Tolerated well  Mobility Referral Yes  Mobility visit 1 Mobility  Mobility Specialist Start Time (ACUTE ONLY) 0905  Mobility Specialist Stop Time (ACUTE ONLY) 0912  Mobility Specialist Time Calculation (min) (ACUTE ONLY) 7 min   Pt received in chair, agreeable to mobility session. Ambulated in hallway, no AD required, CGA for safety. Pt was slightly unsteady during session. Coughing improved. Returned pt back to room, husband and mother at bedside. All needs met.   Mandy Serrano Mobility Specialist Please contact via Special educational needs teacher or  Rehab office at [phone]

## 2024-03-07 NOTE — Progress Notes (Addendum)
 Tee/chart check Telemetry is stable and remains unchanged VSS/HTN No ongoing AMS/agitation discussed Creat creeping down Hopefully AKI will recover enough for coronary evaluation Will look to implant secondary prevention  ICD prior to discharge  Mertha Abrahams, PA-C

## 2024-03-07 NOTE — Progress Notes (Signed)
 PROGRESS NOTE  Mandy Serrano [id number]:[id number] DOB: Jan 21, 1976 DOA: 03/02/2024 PCP: Pcp, No   LOS: 5 days   Brief narrative:  Patient is a 48 y.o. with history of obesity, HTN  was on 5 anti-HTN medicines in [place]), sickle cell anemia, systolic heart failure 05/2016, headaches, and frequent PVCs was admitted to the hospital on previously in 2017 with dyspnea and hypertension.  She had not been taking any medications for the last 1 year.   Most recent EF was 60 to 65% and was last seen in cardiology office on 18th subsequently patient was found to be unresponsive with downtime of 12 min prior to CPR.   She was coded for 52 min, mostly PEA with periods of V Tach and V fib.  She was shocked several times and she was given multiple rounds of Epinephrine IVP then started on Epinephrine drip in ED and vasopressin was added..  She is intubated and poorly responsive.  Patient was admitted to the hospital in the ICU setting.  Patient was subsequently extubated on 03/03/2024.  Subsequently was transferred out of the ICU.  Assessment/Plan: Principal Problem:   Cardiac arrest [facility]) Active Problems:   Shock (HCC)   Acute respiratory failure with hypoxia (HCC)   AKI (acute kidney injury) (HCC)  Cardiac arrest with ROSC.   Initial rhythm was PEA but then subsequently had VF and VT and needed shocks.  Was initially in the ICU and currently stable and extubated.  Echocardiogram showed EF of 55 to 60% but has history of PVCs on flecainide .  PVCs were thought to be contributing to her cardiomyopathy.  Currently on amiodarone  drip.  Electrophysiology cardiology on board.  Plan for cardiac catheterization but creatinine is still elevated.  Continue aspirin .  Awaiting for creatinine to come down around 1.5 or so.  Agitation confusion  Has improved at this time.    Acute hypoxic respiratory failure-resolved.  Secondary to possible aspiration pneumonia.  Completed course of Unasyn for 5 days.  Currently on room air and  has improved  Acute kidney injury.    Renal ultrasound was negative.  Latest creatinine of 2.4 from 2.5 <2.8 < 3.2.  BMP in AM.  Improving slowly..  Congestive heart failure with recovered ejection.  Not volume overloaded.  History of resistant hypertension.  Was on multiple medications in the past but was not taking medications.  Unable to use spironolactone  or losartan  at this time due to AKI.  Continue Coreg  and hydralazine .  Have started amlodipine  since yesterday for better blood pressure control.  Significant PVC burden.  On amiodarone  drip at this time.  Electrophysiology cardiology on board.  Hypomagnesemia.  Improved after replacement.  Latest magnesium  of 2.0.  Class II obesity. Body mass index is 38.86 kg/m.  Would benefit from lifestyle modification and ongoing weight loss.  History of sickle cell anemia.  Not in acute exacerbation.  Will continue to monitor CBC.  Elevated LFTs due to shock liver.  Improved at this time.  AST normalized ALT slightly elevated at 55.  Hyperglycemia.  Latest A1c of 5.8.  On sliding scale insulin.  Latest POC glucose of 107   DVT prophylaxis: heparin  injection 5,000 Units Start: 03/02/24 0600 SCDs Start: 03/02/24 0207   Disposition: Physical therapy has recommended CIR.  Status is: Inpatient  Remains inpatient appropriate because: Pending cardiac workup, renal failure, status post cardiac arrest, need for cardiac catheterization, physical therapy rehabilitation    Code Status:     Code Status: Full Code  Family Communication:  Spoke with the patient's spouse at bedside on 03/05/2024.  Consultants: Cardiology Electrophysiology cardiology PCCM  Procedures: CPR with shocks. Intubation and mechanical ventilation with subsequent extubation,  Anti-infectives:  Unasyn -completed course.   Subjective: Today, patient was seen and examined at bedside.  Today, patient denies any shortness of breath, nausea, vomiting, fever, chills or  rigor.  Was able to rest overnight.  Objective: Vitals:   03/07/24 0412 03/07/24 0812  BP: 139/85 (!) 147/93  Pulse: 80 83  Resp: 18 17  Temp: 98 F (36.7 C) 97.8 F (36.6 C)  SpO2: 97% 99%    Intake/Output Summary (Last 24 hours) at 03/07/2024 0930 Last data filed at 03/07/2024 0800 Gross per 24 hour  Intake 680 ml  Output --  Net 680 ml   Filed Weights   03/05/24 0548 03/06/24 0500 03/07/24 0412  Weight: 105.2 kg 104 kg 102.7 kg   Body mass index is 38.86 kg/m.   Physical Exam:  GENERAL: Patient is alert awake and Communicative, not in obvious distress.  Obese build.   HENT: No scleral pallor or icterus. Pupils equally reactive to light. Oral mucosa is moist NECK: is supple, no gross swelling noted. CHEST: Clear to auscultation. No crackles or wheezes.  CVS: S1 and S2 heard, no murmur. Regular rate and rhythm.  ABDOMEN: Soft, non-tender, bowel sounds are present. EXTREMITIES: No edema. CNS: Cranial nerves are intact. No focal motor deficits. SKIN: warm and dry without rashes.  Data Review: I have personally reviewed the following laboratory data and studies,  CBC: Recent Labs  Lab 03/02/24 0036 03/02/24 0115 03/02/24 0359 03/02/24 0610 03/03/24 0437 03/04/24 0524 03/05/24 0335 03/06/24 0954  WBC 27.5*  --  23.8*  --  11.3* 10.1 10.7* 7.3  NEUTROABS 15.7*  --   --   --   --   --   --   --   HGB 12.1   < > 12.3 12.6 11.2* 10.4* 11.3* 12.7  HCT 40.1   < > 39.0 37.0 34.9* 31.9* 35.3* 38.7  MCV 100.5*  --  96.1  --  93.1 92.5 91.2 89.8  PLT 281  --  320  --  193 169 191 212   < > = values in this interval not displayed.   Basic Metabolic Panel: Recent Labs  Lab 03/02/24 0359 03/02/24 0610 03/03/24 0437 03/03/24 1640 03/03/24 1750 03/04/24 0524 03/05/24 0335 03/06/24 0954 03/07/24 0508  NA 137   < > 137  --  138 138 137 137 135  K 5.4*   < > 3.6  --  4.0 3.9 4.4 4.3 4.3  CL 105   < > 104  --  103 106 106 104 102  CO2 20*   < > 24  --  23 24 22 22  22   GLUCOSE 138*   < > 192*  --  149* 101* 102* 93 93  BUN 34*   < > 48*  --  50* 48* 39* 30* 30*  CREATININE 1.90*  1.91*   < > 3.02*  --  3.23* 3.20* 2.82* 2.54* 2.42*  CALCIUM 8.7*   < > 8.0*  --  7.8* 7.8* 8.2* 8.9 8.7*  MG 2.0  --  1.6*   < > 2.1 1.9 1.9 1.6* 2.0  PHOS 8.6*  --  3.1  --   --   --   --   --   --    < > = values in this interval not displayed.  Liver Function Tests: Recent Labs  Lab 03/02/24 0036 03/03/24 0437 03/04/24 0524 03/05/24 0335 03/07/24 0508  AST 252* 93* 54* 42* 36  ALT 209* 141* 108* 83* 55*  ALKPHOS 66 46 41 62 55  BILITOT 0.6 0.3 0.4 0.4 0.7  PROT 6.5 5.7* 6.0* 6.7 6.7  ALBUMIN 3.0* 2.5* 2.5* 3.0* 3.1*   No results for input(s): "LIPASE", "AMYLASE" in the last 168 hours. No results for input(s): "AMMONIA" in the last 168 hours. Cardiac Enzymes: No results for input(s): "CKTOTAL", "CKMB", "CKMBINDEX", "TROPONINI" in the last 168 hours. BNP (last 3 results) Recent Labs    12/17/23 1131 01/02/24 1559 03/02/24 0359  BNP 133.3* 19.0 137.1*    ProBNP (last 3 results) No results for input(s): "PROBNP" in the last 8760 hours.  CBG: Recent Labs  Lab 03/06/24 0752 03/06/24 1153 03/06/24 1526 03/06/24 2138 03/07/24 0612  GLUCAP 89 103* 91 87 105*   Recent Results (from the past 240 hours)  MRSA Next Gen by PCR, Nasal     Status: None   Collection Time: 03/02/24  2:07 AM   Specimen: Nasal Mucosa; Nasal Swab  Result Value Ref Range Status   MRSA by PCR Next Gen NOT DETECTED NOT DETECTED Final    Comment: (NOTE) The GeneXpert MRSA Assay (FDA approved for NASAL specimens only), is one component of a comprehensive MRSA colonization surveillance program. It is not intended to diagnose MRSA infection nor to guide or monitor treatment for MRSA infections. Test performance is not FDA approved in patients less than 65 years old. Performed at [facility], [address] [place], [place] [address]   Culture, blood (Routine X 2) w  Reflex to ID Panel     Status: None   Collection Time: 03/02/24  3:57 AM   Specimen: BLOOD  Result Value Ref Range Status   Specimen Description BLOOD SITE NOT SPECIFIED  Final   Special Requests   Final    BOTTLES DRAWN AEROBIC AND ANAEROBIC Blood Culture results may not be optimal due to an inadequate volume of blood received in culture bottles   Culture   Final    NO GROWTH 5 DAYS Performed at [facility], [address] [place], [place] [address]    Report Status 03/07/2024 FINAL  Final  Culture, blood (Routine X 2) w Reflex to ID Panel     Status: None   Collection Time: 03/02/24  3:58 AM   Specimen: BLOOD  Result Value Ref Range Status   Specimen Description BLOOD SITE NOT SPECIFIED  Final   Special Requests   Final    BOTTLES DRAWN AEROBIC AND ANAEROBIC Blood Culture results may not be optimal due to an inadequate volume of blood received in culture bottles   Culture   Final    NO GROWTH 5 DAYS Performed at [facility], [address] [place], [place] [address]    Report Status 03/07/2024 FINAL  Final  MRSA Next Gen by PCR, Nasal     Status: None   Collection Time: 03/02/24  6:15 AM   Specimen: Nasal Mucosa; Nasal Swab  Result Value Ref Range Status   MRSA by PCR Next Gen NOT DETECTED NOT DETECTED Final    Comment: (NOTE) The GeneXpert MRSA Assay (FDA approved for NASAL specimens only), is one component of a comprehensive MRSA colonization surveillance program. It is not intended to diagnose MRSA infection nor to guide or monitor treatment for MRSA infections. Test performance is not  FDA approved in patients less than 19 years old. Performed at [facility], [address], [place], [place] [address]      Studies: No results found.     Rosena Conradi, MD  [facility] 03/07/2024  If 7PM-7AM, please contact night-coverage

## 2024-03-08 LAB — CBC
HCT: 35.8 % — ABNORMAL LOW (ref 36.0–46.0)
Hemoglobin: 12.2 g/dL (ref 12.0–15.0)
MCH: 30.1 pg (ref 26.0–34.0)
MCHC: 34.1 g/dL (ref 30.0–36.0)
MCV: 88.4 fL (ref 80.0–100.0)
Platelets: 303 10*3/uL (ref 150–400)
RBC: 4.05 MIL/uL (ref 3.87–5.11)
RDW: 14.2 % (ref 11.5–15.5)
WBC: 9.4 10*3/uL (ref 4.0–10.5)
nRBC: 0 % (ref 0.0–0.2)

## 2024-03-08 LAB — BASIC METABOLIC PANEL WITH GFR
Anion gap: 12 (ref 5–15)
BUN: 30 mg/dL — ABNORMAL HIGH (ref 6–20)
CO2: 22 mmol/L (ref 22–32)
Calcium: 8.9 mg/dL (ref 8.9–10.3)
Chloride: 100 mmol/L (ref 98–111)
Creatinine, Ser: 2.48 mg/dL — ABNORMAL HIGH (ref 0.44–1.00)
GFR, Estimated: 23 mL/min — ABNORMAL LOW (ref >60)
Glucose, Bld: 105 mg/dL — ABNORMAL HIGH (ref 70–99)
Potassium: 4.1 mmol/L (ref 3.5–5.1)
Sodium: 134 mmol/L — ABNORMAL LOW (ref 135–145)

## 2024-03-08 LAB — GLUCOSE, CAPILLARY
Glucose-Capillary: 104 mg/dL — ABNORMAL HIGH (ref 70–99)
Glucose-Capillary: 116 mg/dL — ABNORMAL HIGH (ref 70–99)
Glucose-Capillary: 93 mg/dL (ref 70–99)
Glucose-Capillary: 104 mg/dL — ABNORMAL HIGH (ref 70–99)
Glucose-Capillary: 101 mg/dL — ABNORMAL HIGH (ref 70–99)

## 2024-03-08 LAB — MAGNESIUM
Magnesium: 1.9 mg/dL (ref 1.7–2.4)
Magnesium: 1.7 mg/dL (ref 1.7–2.4)

## 2024-03-08 MED ORDER — MAGNESIUM SULFATE 2 GM/50ML IV SOLN
2.0000 g | Freq: Once | INTRAVENOUS | Status: DC
  Filled 2024-03-08 (×2): qty 50

## 2024-03-08 NOTE — Progress Notes (Signed)
 PROGRESS NOTE  Mandy Serrano ZOX:[id number] DOB: 1975/12/27 DOA: 03/02/2024 PCP: Pcp, No   LOS: 6 days   Brief narrative:  Patient is a 48 y.o. with history of obesity, HTN  was on 5 anti-HTN medicines in [place]), sickle cell anemia, systolic heart failure 05/2016, headaches, and frequent PVCs was admitted to the hospital on previously in 2017 with dyspnea and hypertension.  She had not been taking any medications for the last 1 year.   Most recent EF was 60 to 65% and was last seen in cardiology office on 18th subsequently patient was found to be unresponsive with downtime of 12 min prior to CPR.   She was coded for 52 min, mostly PEA with periods of V Tach and V fib.  She was shocked several times and she was given multiple rounds of Epinephrine  IVP then started on Epinephrine  drip in ED and vasopressin  was added..  She is intubated and poorly responsive.  Patient was admitted to the hospital in the ICU setting.  Patient was subsequently extubated on 03/03/2024.  Subsequently was transferred out of the ICU.  Assessment/Plan: Principal Problem:   Cardiac arrest [facility]) Active Problems:   Shock (HCC)   Acute respiratory failure with hypoxia (HCC)   AKI (acute kidney injury) (HCC)  Cardiac arrest with ROSC.   Initial rhythm was PEA but then subsequently had VF and VT and needed shocks.  Was initially in the ICU and currently stable and extubated.  Today echocardiogram showed EF of 55 to 60% but has history of PVCs on flecainide .  PVCs were thought to be contributing to her cardiomyopathy.  Currently on amiodarone  drip.  Electrophysiology cardiology on board.  Plan for cardiac catheterization but creatinine is still elevated and currently at 2.4 from 2.4..  Continue aspirin .  Awaiting for creatinine to come down around 1.5 or so prior to catheterization..  Agitation confusion has safety sitter again but appears to be alert awake at this time.  Acute hypoxic respiratory failure-resolved.  Secondary  to possible aspiration pneumonia.  Completed course of Unasyn  for 5 days.  Currently on room air and has improved  Acute kidney injury.    Renal ultrasound was negative.  Latest creatinine of 2.4 from 2.5 <2.8 < 3.2.  BMP in AM.  Improving slowly..  Congestive heart failure with recovered ejection.  Not volume overloaded.  Will continue to monitor for  History of resistant hypertension.  Was on multiple medications in the past but was not taking medications.  Unable to use spironolactone  or losartan  at this time due to AKI.  Continue Coreg  and hydralazine , amlodipine .  Blood pressure better.  Significant PVC burden.  Was initially on amiodarone  drip.  Currently on oral amiodarone  400 twice daily..  Electrophysiology cardiology on board.  Hypomagnesemia.  Improved after replacement.  Latest magnesium  of 1.7.  Class II obesity. Body mass index is 38.86 kg/m.  Would benefit from lifestyle modification and ongoing weight loss.  History of sickle cell anemia.  Not in acute exacerbation.  Will continue to monitor CBC.  Latest hemoglobin of 12.2.  Elevated LFTs due to shock liver.  Improved at this time.  Latest AST normalized ALT slightly elevated at 55.  Hyperglycemia.  Latest A1c of 5.8.  On sliding scale insulin .  Latest POC glucose of 116   DVT prophylaxis: heparin  injection 5,000 Units Start: 03/02/24 0600 SCDs Start: 03/02/24 0207   Disposition: Physical therapy has recommended CIR.  Status is: Inpatient  Remains inpatient appropriate because: Pending cardiac workup, renal  failure, status post cardiac arrest, need for cardiac catheterization, physical therapy recommending rehabilitation    Code Status:     Code Status: Full Code  Family Communication: Spoke with the patient's spouse at bedside on 03/05/2024.  Consultants: Cardiology Electrophysiology cardiology PCCM  Procedures: CPR with shocks. Intubation and mechanical ventilation with subsequent  extubation,  Anti-infectives:  Unasyn  -completed course.   Subjective: Today, patient was seen and examined at bedside.  Today, patient denies any pain, nausea, vomiting, fever or chills.  Calm and cooperative sitting on the bedside chair.  Has a sitter in place.   Objective: Vitals:   03/08/24 0050 03/08/24 0722  BP: 120/81 (!) 149/86  Pulse: 80   Resp: 19   Temp: 98.6 F (37 C) 98 F (36.7 C)  SpO2: 97%     Intake/Output Summary (Last 24 hours) at 03/08/2024 0958 Last data filed at 03/08/2024 0834 Gross per 24 hour  Intake 720 ml  Output --  Net 720 ml   Filed Weights   03/05/24 0548 03/06/24 0500 03/07/24 0412  Weight: 105.2 kg 104 kg 102.7 kg   Body mass index is 38.86 kg/m.   Physical Exam:  GENERAL: Patient is alert awake and Communicative, not in obvious distress.  Obese build.  Oriented. HENT: No scleral pallor or icterus. Pupils equally reactive to light. Oral mucosa is moist NECK: is supple, no gross swelling noted. CHEST: Clear to auscultation. No crackles or wheezes.  CVS: S1 and S2 heard, no murmur. Regular rate and rhythm.  ABDOMEN: Soft, non-tender, bowel sounds are present. EXTREMITIES: No edema. CNS: Cranial nerves are intact. No focal motor deficits. SKIN: warm and dry without rashes.  Data Review: I have personally reviewed the following laboratory data and studies,  CBC: Recent Labs  Lab 03/02/24 0036 03/02/24 0115 03/04/24 0524 03/05/24 0335 03/06/24 0954 03/07/24 1008 03/08/24 0256  WBC 27.5*   < > 10.1 10.7* 7.3 8.7 9.4  NEUTROABS 15.7*  --   --   --   --   --   --   HGB 12.1   < > 10.4* 11.3* 12.7 12.0 12.2  HCT 40.1   < > 31.9* 35.3* 38.7 36.5 35.8*  MCV 100.5*   < > 92.5 91.2 89.8 89.9 88.4  PLT 281   < > 169 191 212 246 303   < > = values in this interval not displayed.   Basic Metabolic Panel: Recent Labs  Lab 03/02/24 0359 03/02/24 0610 03/03/24 0437 03/03/24 1640 03/04/24 0524 03/05/24 0335 03/06/24 0954  03/07/24 0508 03/08/24 0828  NA 137   < > 137   < > 138 137 137 135 134*  K 5.4*   < > 3.6   < > 3.9 4.4 4.3 4.3 4.1  CL 105   < > 104   < > 106 106 104 102 100  CO2 20*   < > 24   < > 24 22 22 22 22   GLUCOSE 138*   < > 192*   < > 101* 102* 93 93 105*  BUN 34*   < > 48*   < > 48* 39* 30* 30* 30*  CREATININE 1.90*  1.91*   < > 3.02*   < > 3.20* 2.82* 2.54* 2.42* 2.48*  CALCIUM  8.7*   < > 8.0*   < > 7.8* 8.2* 8.9 8.7* 8.9  MG 2.0  --  1.6*   < > 1.9 1.9 1.6* 2.0 1.7  PHOS 8.6*  --  3.1  --   --   --   --   --   --    < > =  values in this interval not displayed.   Liver Function Tests: Recent Labs  Lab 03/02/24 0036 03/03/24 0437 03/04/24 0524 03/05/24 0335 03/07/24 0508  AST 252* 93* 54* 42* 36  ALT 209* 141* 108* 83* 55*  ALKPHOS 66 46 41 62 55  BILITOT 0.6 0.3 0.4 0.4 0.7  PROT 6.5 5.7* 6.0* 6.7 6.7  ALBUMIN 3.0* 2.5* 2.5* 3.0* 3.1*   No results for input(s): "LIPASE", "AMYLASE" in the last 168 hours. No results for input(s): "AMMONIA" in the last 168 hours. Cardiac Enzymes: No results for input(s): "CKTOTAL", "CKMB", "CKMBINDEX", "TROPONINI" in the last 168 hours. BNP (last 3 results) Recent Labs    12/17/23 1131 01/02/24 1559 03/02/24 0359  BNP 133.3* 19.0 137.1*    ProBNP (last 3 results) No results for input(s): "PROBNP" in the last 8760 hours.  CBG: Recent Labs  Lab 03/07/24 0612 03/07/24 1624 03/07/24 2120 03/08/24 0626 03/08/24 0725  GLUCAP 105* 104* 102* 104* 116*   Recent Results (from the past 240 hours)  MRSA Next Gen by PCR, Nasal     Status: None   Collection Time: 03/02/24  2:07 AM   Specimen: Nasal Mucosa; Nasal Swab  Result Value Ref Range Status   MRSA by PCR Next Gen NOT DETECTED NOT DETECTED Final    Comment: (NOTE) The GeneXpert MRSA Assay (FDA approved for NASAL specimens only), is one component of a comprehensive MRSA colonization surveillance program. It is not intended to diagnose MRSA infection nor to guide or monitor  treatment for MRSA infections. Test performance is not FDA approved in patients less than 31 years old. Performed at [facility], [address], [place], [place] [address]   Culture, blood (Routine X 2) w Reflex to ID Panel     Status: None   Collection Time: 03/02/24  3:57 AM   Specimen: BLOOD  Result Value Ref Range Status   Specimen Description BLOOD SITE NOT SPECIFIED  Final   Special Requests   Final    BOTTLES DRAWN AEROBIC AND ANAEROBIC Blood Culture results may not be optimal due to an inadequate volume of blood received in culture bottles   Culture   Final    NO GROWTH 5 DAYS Performed at [facility], [address] [place], [place] [address]    Report Status 03/07/2024 FINAL  Final  Culture, blood (Routine X 2) w Reflex to ID Panel     Status: None   Collection Time: 03/02/24  3:58 AM   Specimen: BLOOD  Result Value Ref Range Status   Specimen Description BLOOD SITE NOT SPECIFIED  Final   Special Requests   Final    BOTTLES DRAWN AEROBIC AND ANAEROBIC Blood Culture results may not be optimal due to an inadequate volume of blood received in culture bottles   Culture   Final    NO GROWTH 5 DAYS Performed at [facility], [address] [place], [place] [address]    Report Status 03/07/2024 FINAL  Final  MRSA Next Gen by PCR, Nasal     Status: None   Collection Time: 03/02/24  6:15 AM   Specimen: Nasal Mucosa; Nasal Swab  Result Value Ref Range Status   MRSA by PCR Next Gen NOT DETECTED NOT DETECTED Final    Comment: (NOTE) The GeneXpert MRSA Assay (FDA approved for NASAL specimens only), is one component of a comprehensive MRSA colonization surveillance program. It is not intended to diagnose MRSA infection nor to guide or monitor  treatment for MRSA infections. Test performance is not FDA approved in patients less than 35 years old. Performed at [facility], [address] [place], [place] [address]      Studies: No results  found.     Rosena Conradi, MD  [facility] 03/08/2024  If 7PM-7AM, please contact night-coverage

## 2024-03-09 LAB — BASIC METABOLIC PANEL WITH GFR
Anion gap: 11 (ref 5–15)
BUN: 32 mg/dL — ABNORMAL HIGH (ref 6–20)
CO2: 20 mmol/L — ABNORMAL LOW (ref 22–32)
Calcium: 8.7 mg/dL — ABNORMAL LOW (ref 8.9–10.3)
Chloride: 103 mmol/L (ref 98–111)
Creatinine, Ser: 2.48 mg/dL — ABNORMAL HIGH (ref 0.44–1.00)
GFR, Estimated: 23 mL/min — ABNORMAL LOW (ref >60)
Glucose, Bld: 97 mg/dL (ref 70–99)
Potassium: 4.3 mmol/L (ref 3.5–5.1)
Sodium: 134 mmol/L — ABNORMAL LOW (ref 135–145)

## 2024-03-09 LAB — GLUCOSE, CAPILLARY
Glucose-Capillary: 105 mg/dL — ABNORMAL HIGH (ref 70–99)
Glucose-Capillary: 180 mg/dL — ABNORMAL HIGH (ref 70–99)
Glucose-Capillary: 77 mg/dL (ref 70–99)
Glucose-Capillary: 90 mg/dL (ref 70–99)

## 2024-03-09 NOTE — Progress Notes (Signed)
   03/09/24 0905  Mobility  Activity Ambulated independently in hallway  Level of Assistance Standby assist, set-up cues, supervision of patient - no hands on  Assistive Device None  Distance Ambulated (ft) 500 ft  Activity Response Tolerated fair  Mobility Referral Yes  Mobility visit 1 Mobility  Mobility Specialist Start Time (ACUTE ONLY) 0905  Mobility Specialist Stop Time (ACUTE ONLY) 0925  Mobility Specialist Time Calculation (min) (ACUTE ONLY) 20 min   Mobility Specialist: Progress Note  Pre-Mobility:      HR 75, SpO2 100%, BP 146/83 During Mobility: HR 87 Post-Mobility:    HR 72, SpO2 98%  Pt agreeable to mobility session - received in chair. Pt was asymptomatic throughout session with no complaints. Returned to chair with all needs met - call bell within reach. Chair alarm on. Sitter present.   Mandy Serrano, BS Mobility Specialist Please contact via SecureChat or  Rehab office at [phone].

## 2024-03-09 NOTE — Plan of Care (Signed)

## 2024-03-09 NOTE — Plan of Care (Signed)

## 2024-03-09 NOTE — Progress Notes (Signed)
 PROGRESS NOTE  Mandy Serrano YNW:[id number] DOB: Dec 22, 1975 DOA: 03/02/2024 PCP: Pcp, No   LOS: 7 days   Brief narrative:  Patient is a 48 y.o. with history of obesity, HTN  was on 5 anti-HTN medicines in [place]), sickle cell anemia, systolic heart failure 05/2016, headaches, and frequent PVCs was admitted to the hospital on previously in 2017 with dyspnea and hypertension.  She had not been taking any medications for the last 1 year.   Most recent EF was 60 to 65% and was last seen in cardiology office on 18th subsequently patient was found to be unresponsive with downtime of 12 min prior to CPR.   She was coded for 52 min, mostly PEA with periods of V Tach and V fib.  She was shocked several times and she was given multiple rounds of Epinephrine  IVP then started on Epinephrine  drip in ED and vasopressin  was added..  She is intubated and poorly responsive.  Patient was admitted to the hospital in the ICU setting.  Patient was subsequently extubated on 03/03/2024.  Subsequently was transferred out of the ICU.  Assessment/Plan: Principal Problem:   Cardiac arrest [facility]) Active Problems:   Shock (HCC)   Acute respiratory failure with hypoxia (HCC)   AKI (acute kidney injury) (HCC)  Cardiac arrest with ROSC.   Initial rhythm was PEA but then subsequently had VF and VT and needed shocks.  Was initially in the ICU and currently stable and extubated.  Today echocardiogram showed EF of 55 to 60% but has history of PVCs on flecainide .  PVCs were thought to be contributing to her cardiomyopathy.  Currently on amiodarone  drip.  Electrophysiology cardiology on board.  Plan for cardiac catheterization but creatinine is still elevated and currently at 2.4 from 2.4..  Continue aspirin .  Awaiting for creatinine to come down around 1.5 or so prior to catheterization..  Agitation confusion has safety sitter again but appears to be alert awake at this time.  Acute hypoxic respiratory failure-resolved.  Secondary  to possible aspiration pneumonia.  Completed course of Unasyn  for 5 days.  Currently on room air and has improved  Acute kidney injury.    Renal ultrasound was negative.  Latest creatinine of 2.4 from 2.5 <2.8 < 3.2.  BMP in AM.  Improving slowly..  Congestive heart failure with recovered ejection.  Not volume overloaded.  Will continue to monitor for  History of resistant hypertension.  Was on multiple medications in the past but was not taking medications.  Unable to use spironolactone  or losartan  at this time due to AKI.  Continue Coreg  and hydralazine , amlodipine .  Blood pressure better.  Latest blood pressure of 149/86  Significant PVC burden.  Was initially on amiodarone  drip.  Currently on oral amiodarone  400 milligram twice daily..  Electrophysiology cardiology on board.  Hypomagnesemia.  Improved after replacement.  Latest magnesium  of 1.7.  Class II obesity. Body mass index is 38.86 kg/m.  Would benefit from lifestyle modification and ongoing weight loss.  History of sickle cell anemia.  Not in acute exacerbation.  Will continue to monitor CBC.  Latest hemoglobin of 12.2.  Elevated LFTs due to shock liver.  Improved at this time.  Latest AST normalized ALT slightly elevated at 55.  Hyperglycemia.  Latest A1c of 5.8.  On sliding scale insulin .  Latest POC glucose of 105   DVT prophylaxis: heparin  injection 5,000 Units Start: 03/02/24 0600 SCDs Start: 03/02/24 0207   Disposition: Physical therapy has recommended CIR.  Status is: Inpatient  Remains  inpatient appropriate because: Pending cardiac workup, renal failure, status post cardiac arrest, need for cardiac catheterization, physical therapy recommending rehabilitation    Code Status:     Code Status: Full Code  Family Communication: Spoke with the patient's spouse at bedside on 03/05/2024.  Consultants: Cardiology Electrophysiology cardiology PCCM  Procedures: CPR with shocks. Intubation and mechanical  ventilation with subsequent extubation,  Anti-infectives:  Unasyn  -completed course.   Subjective: Today, patient was seen and examined at bedside.  Today, patient sitting bedside.  Denies any nausea vomiting fever chills or rigor.  Denies any urinary urgency frequency or dysuria.  Objective: Vitals:   03/09/24 0734 03/09/24 0839  BP: (!) 155/80 (!) 149/86  Pulse: 71 70  Resp: 16 (!) 23  Temp: 98.2 F (36.8 C) 98.3 F (36.8 C)  SpO2: 100% 98%    Intake/Output Summary (Last 24 hours) at 03/09/2024 1007 Last data filed at 03/09/2024 0900 Gross per 24 hour  Intake 240 ml  Output 0 ml  Net 240 ml   Filed Weights   03/05/24 0548 03/06/24 0500 03/07/24 0412  Weight: 105.2 kg 104 kg 102.7 kg   Body mass index is 38.86 kg/m.   Physical Exam:  GENERAL: Patient is alert awake and Communicative, not in obvious distress.  Obese build.  Oriented. HENT: No scleral pallor or icterus. Pupils equally reactive to light. Oral mucosa is moist NECK: is supple, no gross swelling noted. CHEST: Clear to auscultation. No crackles or wheezes.  CVS: S1 and S2 heard, no murmur. Regular rate and rhythm.  ABDOMEN: Soft, non-tender, bowel sounds are present. EXTREMITIES: No edema. CNS: Cranial nerves are intact. No focal motor deficits. SKIN: warm and dry without rashes.  Data Review: I have personally reviewed the following laboratory data and studies,  CBC: Recent Labs  Lab 03/04/24 0524 03/05/24 0335 03/06/24 0954 03/07/24 1008 03/08/24 0256  WBC 10.1 10.7* 7.3 8.7 9.4  HGB 10.4* 11.3* 12.7 12.0 12.2  HCT 31.9* 35.3* 38.7 36.5 35.8*  MCV 92.5 91.2 89.8 89.9 88.4  PLT 169 191 212 246 303   Basic Metabolic Panel: Recent Labs  Lab 03/03/24 0437 03/03/24 1640 03/04/24 0524 03/05/24 0335 03/06/24 0954 03/07/24 0508 03/08/24 0828 03/09/24 0331  NA 137   < > 138 137 137 135 134* 134*  K 3.6   < > 3.9 4.4 4.3 4.3 4.1 4.3  CL 104   < > 106 106 104 102 100 103  CO2 24   < > 24  22 22 22 22  20*  GLUCOSE 192*   < > 101* 102* 93 93 105* 97  BUN 48*   < > 48* 39* 30* 30* 30* 32*  CREATININE 3.02*   < > 3.20* 2.82* 2.54* 2.42* 2.48* 2.48*  CALCIUM  8.0*   < > 7.8* 8.2* 8.9 8.7* 8.9 8.7*  MG 1.6*   < > 1.9 1.9 1.9 2.0 1.7  --   PHOS 3.1  --   --   --   --   --   --   --    < > = values in this interval not displayed.   Liver Function Tests: Recent Labs  Lab 03/03/24 0437 03/04/24 0524 03/05/24 0335 03/07/24 0508  AST 93* 54* 42* 36  ALT 141* 108* 83* 55*  ALKPHOS 46 41 62 55  BILITOT 0.3 0.4 0.4 0.7  PROT 5.7* 6.0* 6.7 6.7  ALBUMIN 2.5* 2.5* 3.0* 3.1*   No results for input(s): "LIPASE", "AMYLASE" in the last 168  hours. No results for input(s): "AMMONIA" in the last 168 hours. Cardiac Enzymes: No results for input(s): "CKTOTAL", "CKMB", "CKMBINDEX", "TROPONINI" in the last 168 hours. BNP (last 3 results) Recent Labs    12/17/23 1131 01/02/24 1559 03/02/24 0359  BNP 133.3* 19.0 137.1*    ProBNP (last 3 results) No results for input(s): "PROBNP" in the last 8760 hours.  CBG: Recent Labs  Lab 03/08/24 0725 03/08/24 1250 03/08/24 1649 03/08/24 2114 03/09/24 0618  GLUCAP 116* 93 104* 101* 105*   Recent Results (from the past 240 hours)  MRSA Next Gen by PCR, Nasal     Status: None   Collection Time: 03/02/24  2:07 AM   Specimen: Nasal Mucosa; Nasal Swab  Result Value Ref Range Status   MRSA by PCR Next Gen NOT DETECTED NOT DETECTED Final    Comment: (NOTE) The GeneXpert MRSA Assay (FDA approved for NASAL specimens only), is one component of a comprehensive MRSA colonization surveillance program. It is not intended to diagnose MRSA infection nor to guide or monitor treatment for MRSA infections. Test performance is not FDA approved in patients less than 37 years old. Performed at [facility], [address], [place], [place] [address]   Culture, blood (Routine X 2) w Reflex to ID Panel     Status: None   Collection Time: 03/02/24   3:57 AM   Specimen: BLOOD  Result Value Ref Range Status   Specimen Description BLOOD SITE NOT SPECIFIED  Final   Special Requests   Final    BOTTLES DRAWN AEROBIC AND ANAEROBIC Blood Culture results may not be optimal due to an inadequate volume of blood received in culture bottles   Culture   Final    NO GROWTH 5 DAYS Performed at [facility], [address], [place], [place] [address]    Report Status 03/07/2024 FINAL  Final  Culture, blood (Routine X 2) w Reflex to ID Panel     Status: None   Collection Time: 03/02/24  3:58 AM   Specimen: BLOOD  Result Value Ref Range Status   Specimen Description BLOOD SITE NOT SPECIFIED  Final   Special Requests   Final    BOTTLES DRAWN AEROBIC AND ANAEROBIC Blood Culture results may not be optimal due to an inadequate volume of blood received in culture bottles   Culture   Final    NO GROWTH 5 DAYS Performed at [facility], [address] [place], [place] [address]    Report Status 03/07/2024 FINAL  Final  MRSA Next Gen by PCR, Nasal     Status: None   Collection Time: 03/02/24  6:15 AM   Specimen: Nasal Mucosa; Nasal Swab  Result Value Ref Range Status   MRSA by PCR Next Gen NOT DETECTED NOT DETECTED Final    Comment: (NOTE) The GeneXpert MRSA Assay (FDA approved for NASAL specimens only), is one component of a comprehensive MRSA colonization surveillance program. It is not intended to diagnose MRSA infection nor to guide or monitor treatment for MRSA infections. Test performance is not FDA approved in patients less than 24 years old. Performed at [facility], [address] [place], [place] [address]      Studies: No results found.     Rosena Conradi, MD  [facility] 03/09/2024  If 7PM-7AM, please contact night-coverage

## 2024-03-10 ENCOUNTER — Encounter (HOSPITAL_COMMUNITY): Payer: Self-pay | Admitting: Internal Medicine

## 2024-03-10 ENCOUNTER — Ambulatory Visit (HOSPITAL_COMMUNITY): Payer: Self-pay

## 2024-03-10 LAB — GLUCOSE, CAPILLARY
Glucose-Capillary: 125 mg/dL — ABNORMAL HIGH (ref 70–99)
Glucose-Capillary: 127 mg/dL — ABNORMAL HIGH (ref 70–99)
Glucose-Capillary: 115 mg/dL — ABNORMAL HIGH (ref 70–99)
Glucose-Capillary: 127 mg/dL — ABNORMAL HIGH (ref 70–99)

## 2024-03-10 LAB — BASIC METABOLIC PANEL WITH GFR
Anion gap: 10 (ref 5–15)
BUN: 32 mg/dL — ABNORMAL HIGH (ref 6–20)
CO2: 20 mmol/L — ABNORMAL LOW (ref 22–32)
Calcium: 8.9 mg/dL (ref 8.9–10.3)
Chloride: 104 mmol/L (ref 98–111)
Creatinine, Ser: 2.58 mg/dL — ABNORMAL HIGH (ref 0.44–1.00)
GFR, Estimated: 22 mL/min — ABNORMAL LOW (ref >60)
Glucose, Bld: 96 mg/dL (ref 70–99)
Potassium: 4.4 mmol/L (ref 3.5–5.1)
Sodium: 134 mmol/L — ABNORMAL LOW (ref 135–145)

## 2024-03-10 LAB — CBC
HCT: 36.5 % (ref 36.0–46.0)
Hemoglobin: 12.1 g/dL (ref 12.0–15.0)
MCH: 30.1 pg (ref 26.0–34.0)
MCHC: 33.2 g/dL (ref 30.0–36.0)
MCV: 90.8 fL (ref 80.0–100.0)
Platelets: 368 10*3/uL (ref 150–400)
RBC: 4.02 MIL/uL (ref 3.87–5.11)
RDW: 14.2 % (ref 11.5–15.5)
WBC: 9.6 10*3/uL (ref 4.0–10.5)
nRBC: 0 % (ref 0.0–0.2)

## 2024-03-10 LAB — MAGNESIUM: Magnesium: 1.8 mg/dL (ref 1.7–2.4)

## 2024-03-10 MED ORDER — MAGNESIUM SULFATE 2 GM/50ML IV SOLN
2.0000 g | Freq: Once | INTRAVENOUS | Status: AC
  Administered 2024-03-10: 2 g via INTRAVENOUS
  Filled 2024-03-10: qty 50

## 2024-03-10 MED ORDER — LORAZEPAM 1 MG PO TABS: 1.0000 mg | ORAL_TABLET | Freq: Three times a day (TID) | ORAL | Status: DC | PRN

## 2024-03-10 NOTE — Progress Notes (Signed)
 Occupational Therapy Treatment Patient Details Name: Mandy Serrano MRN: [id number] DOB: October 03, 1976 Today's Date: 03/10/2024   History of present illness 48 yo female admitted 5/11 after found down approximately prior to CPR of 52 minutes. Intubated 5/11-5/12. 5/12 EEG with global cerebral dysfunction. PMhx: HTN, CHF, obesity, PVC, sickle cell anemia.   OT comments  Pt progressing toward goals. Completing ADL tasks with distant S. Impaired cognition affecting ability to recall daily events, especially hospital related events. Malayasia is interested in making a hospital journal to use as a compensatory strategy to help with recall of events. Will also provide a written monthly calendar for her to use with all staff. Continue to recommend OT follow up at a neuro outpt clinic.       If plan is discharge home, recommend the following:  Assistance with cooking/housework;Direct supervision/assist for financial management;Direct supervision/assist for medications management;Assist for transportation   Equipment Recommendations  Tub/shower seat    Recommendations for Other Services      Precautions / Restrictions Precautions Precautions: Fall       Mobility Bed Mobility Overal bed mobility: Independent                  Transfers Overall transfer level: Modified independent                       Balance     Sitting balance-Leahy Scale: Good       Standing balance-Leahy Scale: Good                             ADL either performed or assessed with clinical judgement   ADL                                       Functional mobility during ADLs: Modified independent General ADL Comments: Overall set up for ADL tasks. Able to redirect attention back to task after interruption. Bending over to retrieve items from floor without LOB.    Extremity/Trunk Assessment Upper Extremity Assessment Upper Extremity Assessment: Generalized  weakness   Lower Extremity Assessment Lower Extremity Assessment: Defer to PT evaluation        Vision   Additional Comments: Appears [facility]   Perception     Praxis     Communication     Cognition Arousal: Alert Behavior During Therapy: WFL for tasks assessed/performed Cognition: Cognition impaired   Orientation impairments: Time, Situation Awareness: Online awareness impaired Memory impairment (select all impairments): Short-term memory, Working [employer] fast streamer, Conservation officer, historic buildings Attention impairment (select first level of impairment): Selective attention Executive functioning impairment (select all impairments): Problem solving, Reasoning OT - Cognition Comments: States she is herre because of her kidneys, then she states her heart. Asking when she will be discharged. Does not appear to understand/remember events of hospitalization. Discussed plan to make a hospital log so she can refer to the written information,                          Cueing      Exercises      Shoulder Instructions       General Comments  Chandra helped prepare cars for painting - able to give basic sequence of explaining process; gave recipe for making soup however stated it would only need to cook for 2 minutes  Pertinent Vitals/ Pain       Pain Assessment Pain Assessment: No/denies pain  Home Living                                          Prior Functioning/Environment              Frequency  Min 2X/week        Progress Toward Goals  OT Goals(current goals can now be found in the care plan section)  Progress towards OT goals: Progressing toward goals  Acute Rehab OT Goals Patient Stated Goal: none stated OT Goal Formulation: With patient Time For Goal Achievement: 03/18/24 Potential to Achieve Goals: Good ADL Goals Pt Will Perform Upper Body Dressing: Independently;sitting Pt Will Perform Lower Body Dressing: Independently;sitting/lateral  leans;sit to/from stand Pt Will Transfer to Toilet: Independently;ambulating;regular height toilet Pt Will Perform Tub/Shower Transfer: Tub transfer;Shower transfer;Independently;ambulating Additional ADL Goal #1: pt will follow 3 step command with  min cues in prep for ADLs  Plan      Co-evaluation                 AM-PAC OT "6 Clicks" Daily Activity     Outcome Measure   Help from another person eating meals?: None Help from another person taking care of personal grooming?: A Little Help from another person toileting, which includes using toliet, bedpan, or urinal?: A Little Help from another person bathing (including washing, rinsing, drying)?: A Little Help from another person to put on and taking off regular upper body clothing?: A Little Help from another person to put on and taking off regular lower body clothing?: A Little 6 Click Score: 19    End of Session    OT Visit Diagnosis: Unsteadiness on feet (R26.81);Other abnormalities of gait and mobility (R26.89);Muscle weakness (generalized) (M62.81);Other symptoms and signs involving cognitive function   Activity Tolerance Patient tolerated treatment well   Patient Left in bed;with call bell/phone within reach   Nurse Communication Mobility status        Time: 1324-4010 OT Time Calculation (min): 18 min  Charges: OT General Charges $OT Visit: 1 Visit OT Treatments $Self Care/Home Management : 8-22 mins  Milburn Aliment, OT/L   Acute OT Clinical Specialist Acute Rehabilitation Services Pager [phone] Office [phone]   [facility] 03/10/2024, 1:06 PM

## 2024-03-10 NOTE — Progress Notes (Signed)
 Mobility Specialist Progress Note:    03/10/24 0930  Mobility  Activity Ambulated with assistance in room;Ambulated with assistance to bathroom  Level of Assistance Standby assist, set-up cues, supervision of patient - no hands on  Assistive Device None  Distance Ambulated (ft) 500 ft  Activity Response Tolerated well  Mobility Referral Yes  Mobility visit 1 Mobility  Mobility Specialist Start Time (ACUTE ONLY) 0930  Mobility Specialist Stop Time (ACUTE ONLY) 0935  Mobility Specialist Time Calculation (min) (ACUTE ONLY) 5 min   Pt received in bed, agreeable to mobility session. Ambulated in hallway, no AD required. SBA for safety.  Returned pt back to room with out fault. Family in room, all needs met.    Fain Francis Mobility Specialist Please contact via Special educational needs teacher or  Rehab office at [phone]

## 2024-03-10 NOTE — Progress Notes (Signed)
 PROGRESS NOTE  Mandy Serrano UJW:[id number] DOB: October 02, 1976 DOA: 03/02/2024 PCP: Pcp, No   LOS: 8 days   Brief narrative:  Patient is a 48 y.o. with history of obesity, HTN  was on multiple antihypertensive medication in [place], sickle cell anemia, systolic heart failure 05/2016, headaches, and frequent PVCs was admitted to the hospital on previously in 2017 with dyspnea and hypertension.  She had not been taking any medications for the last 1 year.   Most recent EF was 60 to 65% and was last seen in cardiology office on 18th subsequently patient was found to be unresponsive with downtime of 12 min prior to CPR.   She was coded for 52 min, mostly PEA with periods of V Tach and V fib.  She was shocked several times and she was given multiple rounds of Epinephrine  IVP then started on Epinephrine  drip in ED and vasopressin  was added..  She is intubated and poorly responsive.  Patient was admitted to the hospital in the ICU setting.  Patient was subsequently extubated on 03/03/2024.  Subsequently was transferred out of the ICU to medical service.  At this time patient is awaiting for renal function to improve prior to cardiac catheterization..  Assessment/Plan: Principal Problem:   Cardiac arrest (HCC) Active Problems:   Shock (HCC)   Acute respiratory failure with hypoxia (HCC)   AKI (acute kidney injury) (HCC)  Cardiac arrest with ROSC.   Initial rhythm was PEA but then subsequently had VF and VT and needed shocks.  Was initially in the ICU and currently stable and extubated.  Today echocardiogram showed EF of 55 to 60% but has history of PVCs on flecainide .  PVCs were thought to be contributing to her cardiomyopathy.  Currently on amiodarone  p.o.  Cardiology on board..  Plan for cardiac catheterization but creatinine is still elevated and currently at 2.5 from previous 2.4 <2.4.  Continue aspirin  and Coreg .Mandy Serrano  Awaiting for creatinine to come down around 1.5 or so prior to catheterization but  creatinine still elevated..  Plan for cardiac MRI.  Might need limited cardiac catheterization prior to ICD.  Agitation confusion with.  Alert awake oriented at this time.  Acute hypoxic respiratory failure-resolved.  Secondary to possible aspiration pneumonia.  Completed course of Unasyn  for 5 days.  On room air at this time.  Acute kidney injury.    Renal ultrasound was negative.  Latest creatinine of 2.5 from 2.4 < 2.5 <2.8 < 3.2.  Continue to monitor BMP in AM.  # Acute at this time.  Congestive heart failure with recovered ejection.  Not volume overloaded.  Will continue to monitor.  Plan for cardiac MRI  History of resistant hypertension.  Was on multiple medications in the past but was not taking medications.  Unable to use spironolactone  or losartan  at this time due to AKI.  Continue Coreg  and hydralazine , amlodipine .  Blood pressure better.  Latest blood pressure of 132/94  Significant PVC burden.  Was initially on amiodarone  drip.  Currently on oral amiodarone  400 milligram twice daily..  Cardiology on board  Hypomagnesemia.  Improved after replacement.  Latest magnesium  of 1.8  Class II obesity. Body mass index is 38.16 kg/m.  Would benefit from lifestyle modification and ongoing weight loss.  History of sickle cell anemia.  Not in acute exacerbation.  Will continue to monitor CBC.  Latest hemoglobin of 12.1, not sure if this is a true diagnosis..  Elevated LFTs due to shock liver.  Improved at this time.  Latest AST  normalized ALT slightly elevated at 55.  Hyperglycemia.  Latest A1c of 5.8.  On sliding scale insulin .  Latest POC glucose of 125   DVT prophylaxis: heparin  injection 5,000 Units Start: 03/02/24 0600 SCDs Start: 03/02/24 0207   Disposition: Physical therapy has recommended CIR.  Status is: Inpatient  Remains inpatient appropriate because: Pending cardiac workup, renal failure, status post cardiac arrest, need for , physical therapy recommending  rehabilitation    Code Status:     Code Status: Full Code  Family Communication: None at bedside.  Spoke with the patient's spouse at bedside on 03/05/2024.  Consultants: Cardiology Electrophysiology cardiology PCCM  Procedures: CPR with shocks. Intubation and mechanical ventilation with subsequent extubation,  Anti-infectives:  Unasyn  -completed course.   Subjective: Today, patient was seen and examined at bedside.  Today, patient denies any pain, nausea, vomiting, fever, chills or rigor.  Has been eating okay.  Has had bowel movements.  Objective: Vitals:   03/10/24 0738 03/10/24 0800  BP: (!) 132/94   Pulse: 70   Resp: 19 17  Temp: 98.6 F (37 C)   SpO2: 100%     Intake/Output Summary (Last 24 hours) at 03/10/2024 1127 Last data filed at 03/09/2024 1200 Gross per 24 hour  Intake 240 ml  Output 0 ml  Net 240 ml   Filed Weights   03/06/24 0500 03/07/24 0412 03/10/24 0800  Weight: 104 kg 102.7 kg 100.9 kg   Body mass index is 38.16 kg/m.   Physical Exam:  General: Obese built, not in obvious distress, oriented HENT:   No scleral pallor or icterus noted. Oral mucosa is moist.  Chest:  Clear breath sounds.  Diminished breath sounds bilaterally. No crackles or wheezes.  CVS: S1 &S2 heard. No murmur.  Regular rate and rhythm. Abdomen: Soft, nontender, nondistended.  Bowel sounds are heard.   Extremities: No cyanosis, clubbing or edema.  Peripheral pulses are palpable. Psych: Alert, awake and oriented, normal mood CNS:  No cranial nerve deficits.  Power equal in all extremities.   Skin: Warm and dry.  No rashes noted.   Data Review: I have personally reviewed the following laboratory data and studies,  CBC: Recent Labs  Lab 03/05/24 0335 03/06/24 0954 03/07/24 1008 03/08/24 0256 03/10/24 0347  WBC 10.7* 7.3 8.7 9.4 9.6  HGB 11.3* 12.7 12.0 12.2 12.1  HCT 35.3* 38.7 36.5 35.8* 36.5  MCV 91.2 89.8 89.9 88.4 90.8  PLT 191 212 246 303 368   Basic  Metabolic Panel: Recent Labs  Lab 03/05/24 0335 03/06/24 0954 03/07/24 0508 03/08/24 0828 03/09/24 0331 03/10/24 0347  NA 137 137 135 134* 134* 134*  K 4.4 4.3 4.3 4.1 4.3 4.4  CL 106 104 102 100 103 104  CO2 22 22 22 22  20* 20*  GLUCOSE 102* 93 93 105* 97 96  BUN 39* 30* 30* 30* 32* 32*  CREATININE 2.82* 2.54* 2.42* 2.48* 2.48* 2.58*  CALCIUM  8.2* 8.9 8.7* 8.9 8.7* 8.9  MG 1.9 1.9 2.0 1.7  --  1.8   Liver Function Tests: Recent Labs  Lab 03/04/24 0524 03/05/24 0335 03/07/24 0508  AST 54* 42* 36  ALT 108* 83* 55*  ALKPHOS 41 62 55  BILITOT 0.4 0.4 0.7  PROT 6.0* 6.7 6.7  ALBUMIN 2.5* 3.0* 3.1*   No results for input(s): "LIPASE", "AMYLASE" in the last 168 hours. No results for input(s): "AMMONIA" in the last 168 hours. Cardiac Enzymes: No results for input(s): "CKTOTAL", "CKMB", "CKMBINDEX", "TROPONINI" in the last 168  hours. BNP (last 3 results) Recent Labs    12/17/23 1131 01/02/24 1559 03/02/24 0359  BNP 133.3* 19.0 137.1*    ProBNP (last 3 results) No results for input(s): "PROBNP" in the last 8760 hours.  CBG: Recent Labs  Lab 03/09/24 0618 03/09/24 1115 03/09/24 1609 03/09/24 2054 03/10/24 0612  GLUCAP 105* 180* 77 90 125*   Recent Results (from the past 240 hours)  MRSA Next Gen by PCR, Nasal     Status: None   Collection Time: 03/02/24  2:07 AM   Specimen: Nasal Mucosa; Nasal Swab  Result Value Ref Range Status   MRSA by PCR Next Gen NOT DETECTED NOT DETECTED Final    Comment: (NOTE) The GeneXpert MRSA Assay (FDA approved for NASAL specimens only), is one component of a comprehensive MRSA colonization surveillance program. It is not intended to diagnose MRSA infection nor to guide or monitor treatment for MRSA infections. Test performance is not FDA approved in patients less than 48 years old. Performed at [facility], [address] [place], [place] [address]   Culture, blood (Routine X 2) w Reflex to ID Panel     Status: None    Collection Time: 03/02/24  3:57 AM   Specimen: BLOOD  Result Value Ref Range Status   Specimen Description BLOOD SITE NOT SPECIFIED  Final   Special Requests   Final    BOTTLES DRAWN AEROBIC AND ANAEROBIC Blood Culture results may not be optimal due to an inadequate volume of blood received in culture bottles   Culture   Final    NO GROWTH 5 DAYS Performed at [facility], [address], [place], [place] [address]    Report Status 03/07/2024 FINAL  Final  Culture, blood (Routine X 2) w Reflex to ID Panel     Status: None   Collection Time: 03/02/24  3:58 AM   Specimen: BLOOD  Result Value Ref Range Status   Specimen Description BLOOD SITE NOT SPECIFIED  Final   Special Requests   Final    BOTTLES DRAWN AEROBIC AND ANAEROBIC Blood Culture results may not be optimal due to an inadequate volume of blood received in culture bottles   Culture   Final    NO GROWTH 5 DAYS Performed at [facility], [address] [place], [place] [address]    Report Status 03/07/2024 FINAL  Final  MRSA Next Gen by PCR, Nasal     Status: None   Collection Time: 03/02/24  6:15 AM   Specimen: Nasal Mucosa; Nasal Swab  Result Value Ref Range Status   MRSA by PCR Next Gen NOT DETECTED NOT DETECTED Final    Comment: (NOTE) The GeneXpert MRSA Assay (FDA approved for NASAL specimens only), is one component of a comprehensive MRSA colonization surveillance program. It is not intended to diagnose MRSA infection nor to guide or monitor treatment for MRSA infections. Test performance is not FDA approved in patients less than 25 years old. Performed at [facility], [address], [place], [place] [address]      Studies: No results found.     Rosena Conradi, MD  [facility] 03/10/2024  If 7PM-7AM, please contact night-coverage

## 2024-03-10 NOTE — Plan of Care (Signed)

## 2024-03-10 NOTE — Plan of Care (Signed)

## 2024-03-10 NOTE — Progress Notes (Signed)
 Physical Therapy Treatment and Discharge Patient Details Name: Mandy Serrano MRN: [id number] DOB: [birth year] Today's Date: 03/10/2024   History of Present Illness 48 yo female admitted 5/11 after found down approximately prior to CPR of 52 minutes. Intubated 5/11-5/12. 5/12 EEG with global cerebral dysfunction. PMhx: HTN, CHF, obesity, PVC, sickle cell anemia.    PT Comments  Patient reports nursing has allowed her to walk to bathroom independently. Noted to be independent with hall ambulation. Climbed 20 steps with one handrail with max HR 88 bpm. No drift/imbalance with dynamic activities. Goals met and will discharge from PT.     If plan is discharge home, recommend the following: A little help with bathing/dressing/bathroom;Assistance with cooking/housework;Direct supervision/assist for medications management;Direct supervision/assist for financial management;Assist for transportation;Supervision due to cognitive status   Can travel by private vehicle        Equipment Recommendations  None recommended by PT    Recommendations for Other Services       Precautions / Restrictions Precautions Precautions: Fall Recall of Precautions/Restrictions: Impaired Restrictions Weight Bearing Restrictions Per Provider Order: No     Mobility  Bed Mobility                    Transfers Overall transfer level: Independent Equipment used: None Transfers: Sit to/from Stand                  Ambulation/Gait Ambulation/Gait assistance: Independent Educational psychologist (Feet): 250 Feet Assistive device: None Gait Pattern/deviations: Step-through pattern, Decreased stride length   Gait velocity interpretation: >2.62 ft/sec, indicative of community ambulatory   General Gait Details: no drifting noted with dynamic balance challenges during gait   Stairs Stairs: Yes Stairs assistance: Modified independent (Device/Increase time) Stair Management: One rail Left, Alternating  pattern, Forwards Number of Stairs: 20 General stair comments: pt with only minimal rest break at top of stairs; HR max 88   Wheelchair Mobility     Tilt Bed    Modified Rankin (Stroke Patients Only)       Balance Overall balance assessment: Needs assistance Sitting-balance support: No upper extremity supported, Feet supported Sitting balance-Leahy Scale: Good     Standing balance support: No upper extremity supported Standing balance-Leahy Scale: Good                              Communication Communication Communication: Other (comment) (needed some statements repeated) Factors Affecting Communication: Other (comment) (speaking and understanding English, some repetition needed)  Cognition Arousal: Alert Behavior During Therapy: WFL for tasks assessed/performed                             Following commands: Intact Following commands impaired: Follows one step commands with increased time    Cueing Cueing Techniques: Verbal cues, Gestural cues, Tactile cues, Visual cues  Exercises      General Comments        Pertinent Vitals/Pain Pain Assessment Pain Assessment: No/denies pain    Home Living                          Prior Function            PT Goals (current goals can now be found in the care plan section) Acute Rehab PT Goals Patient Stated Goal: To go home with family Progress towards PT goals: Goals met/education completed, patient  discharged from PT    Frequency    Min 3X/week      PT Plan      Co-evaluation              AM-PAC PT "6 Clicks" Mobility   Outcome Measure  Help needed turning from your back to your side while in a flat bed without using bedrails?: None Help needed moving from lying on your back to sitting on the side of a flat bed without using bedrails?: None Help needed moving to and from a bed to a chair (including a wheelchair)?: None Help needed standing up from a chair using  your arms (e.g., wheelchair or bedside chair)?: None Help needed to walk in hospital room?: None Help needed climbing 3-5 steps with a railing? : None 6 Click Score: 24    End of Session   Activity Tolerance: Patient tolerated treatment well Patient left: in bed;with nursing/sitter in room Nurse Communication: Mobility status;Other (comment) (HR max 88) PT Visit Diagnosis: Muscle weakness (generalized) (M62.81);Difficulty in walking, not elsewhere classified (R26.2)     Time: 2956-2130 PT Time Calculation (min) (ACUTE ONLY): 11 min  Charges:    $Gait Training: 8-22 mins PT General Charges $$ ACUTE PT VISIT: 1 Visit                      Gayle Kava, PT Acute Rehabilitation Services  Office [phone]    Guilford Leep 03/10/2024, 3:04 PM

## 2024-03-10 NOTE — Progress Notes (Signed)
  EP following for ICD pending medical readiness.   Ideally, would get LHC or CTA, but Cr up again this am.   HF team planning cMRI, and can plan from there. If no signs of ischemic disease likely OK to proceed with ICD pending lab availability; if concerns for ischemia will need to further discuss.   Bambi Lever [address] [place], PA-C  03/10/2024 11:26 AM

## 2024-03-10 NOTE — Progress Notes (Addendum)
 Advanced Heart Failure Rounding Note  Cardiologist: None  Chief Complaint: OOH Cardiac Arrest Subjective:    EP planning ICD prior to discharge for secondary prevention. Remains in SR on tele. Agitation resolved. sCr remains elevated 2.4-2.5  Sitting up in bed. Feeling well this morning. Ambulating around room. No SOB, CP.   Objective:    Weight Range: 100.9 kg Body mass index is 38.16 kg/m.   Vital Signs:   Temp:  [98 F (36.7 C)-98.6 F (37 C)] 98.6 F (37 C) (05/19 0738) Pulse Rate:  [62-77] 70 (05/19 0738) Resp:  [15-22] 17 (05/19 0800) BP: (112-134)/(62-94) 132/94 (05/19 0738) SpO2:  [97 %-100 %] 100 % (05/19 0738) Weight:  [100.9 kg] 100.9 kg (05/19 0800) Last BM Date : 03/09/24  Weight change: Filed Weights   03/06/24 0500 03/07/24 0412 03/10/24 0800  Weight: 104 kg 102.7 kg 100.9 kg   Intake/Output:  Intake/Output Summary (Last 24 hours) at 03/10/2024 0948 Last data filed at 03/09/2024 1200 Gross per 24 hour  Intake 240 ml  Output 0 ml  Net 240 ml    Physical Exam    General: Well appearing. No distress on RA Cardiac: JVP flat. S1 and S2 present. No murmurs or rub. Extremities: Warm and dry.  No peripheral edema.  Neuro: Alert and oriented x3. Affect pleasant. Moves all extremities without difficulty.  Telemetry   SR in 70s, PVCs improving (personally reviewed)  EKG    N/A  Labs    CBC Recent Labs    03/08/24 0256 03/10/24 0347  WBC 9.4 9.6  HGB 12.2 12.1  HCT 35.8* 36.5  MCV 88.4 90.8  PLT 303 368   Basic Metabolic Panel Recent Labs    16/10/96 0828 03/09/24 0331 03/10/24 0347  NA 134* 134* 134*  K 4.1 4.3 4.4  CL 100 103 104  CO2 22 20* 20*  GLUCOSE 105* 97 96  BUN 30* 32* 32*  CREATININE 2.48* 2.48* 2.58*  CALCIUM  8.9 8.7* 8.9  MG 1.7  --  1.8   BNP (last 3 results) Recent Labs    12/17/23 1131 01/02/24 1559 03/02/24 0359  BNP 133.3* 19.0 137.1*   Imaging   No results found.  Medications:    Scheduled  Medications:  amiodarone   400 mg Oral BID   amLODipine   10 mg Oral Daily   aspirin   81 mg Oral Daily   carvedilol   12.5 mg Oral BID WC   docusate sodium   100 mg Oral BID   heparin   5,000 Units Subcutaneous Q8H   hydrALAZINE   50 mg Oral Q8H   insulin  aspart  2-6 Units Subcutaneous TID WC   polyethylene glycol  17 g Oral Daily   Infusions:  magnesium  sulfate bolus IVPB     PRN Medications: haloperidol  lactate, LORazepam , menthol -cetylpyridinium, ondansetron  (ZOFRAN ) IV, mouth rinse, polyethylene glycol  Patient Profile   48 y.o. with history of systolic heart failure in 2017, felt PVC mediated. PVCs suppressed w/ Flecainide  and EF improved/normalized. Also w/ HTN, sickle cell anemia, admitted post OOH VF arrest.   Assessment/Plan   1. OOH VF arrest: At least 52 minutes to ROSC.  Initial rhythm was VF per EP review of EMS record.   - She has recovered surprisingly well, now extubated and conversant. Intermittently confused/agitated. - Echo EF 55-60% with moderate LVH, normal RV, IVC normal.   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   - Continue amio 400  mg bid - EP following. They recommend evaluation of coronaries, LHC vs CTA, when renal fx normalizes.  - Meets criteria for secondary prevention ICD prior to discharge, timing per EP 2. 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  LVH on echo c/w HTN.  Prior cardiac MRI with no delayed enhancement.   - Volume ok. - Continue PVC suppression w/ amiodarone   - Normotensive. Continue hydralazine  to 50 TID. 3. HTN: History of resistant HTN.  Has been off meds post-arrest. SBPs now elevated  - With AKI, would stay off spironolactone  and losartan .  - Continue hydralazine  as above 4. AKI: Suspect ATN due to cardiac arrest/shock. SCr peaked at 3.2 but showing  s/o recovery.  - Cr stable ~2.5 5. PVCs: Long history of frequent PVCs, thought to contribute to prior cardiomyopathy.  She was initially on amiodarone  then switched to flecainide  when EF recovered.  - continue amio 400 mg bid 6. Hypomagnesemia: Mg 1.8 today - Supplement  Length of Stay: 8  [place] Lee, NP  03/10/2024, 9:48 AM  Advanced Heart Failure Team Pager [phone] (M-F; 7a - 5p)  Please contact [facility] for night-coverage after hours (5p -7a ) and weekends on [email or web address]  Patient seen and examined with the above-signed Advanced Practice Provider and/or Housestaff. I personally reviewed laboratory data, imaging studies and relevant notes. I independently examined the patient and formulated the important aspects of the plan. I have edited the note to reflect any of my changes or salient points. I have personally discussed the plan with the patient and/or family.  Feels ok. Denies CP or SOB. PICC line out. Scr seems to have plateaued at 2.5   Rhythm stable.   General:  Sitting up in bed No resp difficulty HEENT: normal Neck: supple. no JVD. Carotids 2+ bilat; no bruits. No lymphadenopathy or thryomegaly appreciated. Cor: PMI nondisplaced. Regular rate & rhythm. No rubs, gallops or murmurs. Lungs: clear Abdomen: obese soft, nontender, nondistended. No hepatosplenomegaly. No bruits or masses. Good bowel sounds. Extremities: no cyanosis, clubbing, rash, edema Neuro: alert & orientedx3, cranial nerves grossly intact. moves all 4 extremities w/o difficulty. Affect pleasant  I spoke with EP today about ICD. They would understandably prefer to have coronary angio prior to ICD but Scr currently not favorable for this. Will start with cMRI today. If high-risk may need to consider coronary angio with limited contrast exposure.   Jules Oar, MD  11:24 AM

## 2024-03-11 ENCOUNTER — Inpatient Hospital Stay (HOSPITAL_COMMUNITY): Payer: MEDICAID

## 2024-03-11 DIAGNOSIS — I469 Cardiac arrest, cause unspecified: Secondary | ICD-10-CM

## 2024-03-11 LAB — GLUCOSE, CAPILLARY
Glucose-Capillary: 101 mg/dL — ABNORMAL HIGH (ref 70–99)
Glucose-Capillary: 102 mg/dL — ABNORMAL HIGH (ref 70–99)
Glucose-Capillary: 102 mg/dL — ABNORMAL HIGH (ref 70–99)
Glucose-Capillary: 97 mg/dL (ref 70–99)

## 2024-03-11 LAB — BASIC METABOLIC PANEL WITH GFR
Anion gap: 10 (ref 5–15)
BUN: 37 mg/dL — ABNORMAL HIGH (ref 6–20)
CO2: 20 mmol/L — ABNORMAL LOW (ref 22–32)
Calcium: 8.6 mg/dL — ABNORMAL LOW (ref 8.9–10.3)
Chloride: 103 mmol/L (ref 98–111)
Creatinine, Ser: 2.71 mg/dL — ABNORMAL HIGH (ref 0.44–1.00)
GFR, Estimated: 21 mL/min — ABNORMAL LOW (ref >60)
Glucose, Bld: 97 mg/dL (ref 70–99)
Potassium: 4.5 mmol/L (ref 3.5–5.1)
Sodium: 133 mmol/L — ABNORMAL LOW (ref 135–145)

## 2024-03-11 LAB — CBC
HCT: 35.4 % — ABNORMAL LOW (ref 36.0–46.0)
Hemoglobin: 11.7 g/dL — ABNORMAL LOW (ref 12.0–15.0)
MCH: 30.3 pg (ref 26.0–34.0)
MCHC: 33.1 g/dL (ref 30.0–36.0)
MCV: 91.7 fL (ref 80.0–100.0)
Platelets: 385 10*3/uL (ref 150–400)
RBC: 3.86 MIL/uL — ABNORMAL LOW (ref 3.87–5.11)
RDW: 14.3 % (ref 11.5–15.5)
WBC: 8.7 10*3/uL (ref 4.0–10.5)
nRBC: 0 % (ref 0.0–0.2)

## 2024-03-11 LAB — MAGNESIUM: Magnesium: 2 mg/dL (ref 1.7–2.4)

## 2024-03-11 MED ORDER — LORAZEPAM 0.5 MG PO TABS: 0.5000 mg | ORAL_TABLET | Freq: Three times a day (TID) | ORAL | Status: DC | PRN

## 2024-03-11 MED ORDER — AMINOPHYLLINE 25 MG/ML IV SOLN
INTRAVENOUS | Status: AC
  Filled 2024-03-11: qty 10

## 2024-03-11 MED ORDER — ALBUTEROL SULFATE HFA 108 (90 BASE) MCG/ACT IN AERS
INHALATION_SPRAY | RESPIRATORY_TRACT | Status: AC
  Filled 2024-03-11: qty 6.7

## 2024-03-11 MED ORDER — METOPROLOL TARTRATE 5 MG/5ML IV SOLN
INTRAVENOUS | Status: AC
  Filled 2024-03-11: qty 5

## 2024-03-11 MED ORDER — REGADENOSON 0.4 MG/5ML IV SOLN
INTRAVENOUS | Status: AC
  Filled 2024-03-11: qty 5

## 2024-03-11 MED ORDER — REGADENOSON 0.4 MG/5ML IV SOLN
0.4000 mg | Freq: Once | INTRAVENOUS | Status: AC
  Administered 2024-03-11: 0.4 mg via INTRAVENOUS
  Filled 2024-03-11: qty 5

## 2024-03-11 MED ORDER — NITROGLYCERIN 0.4 MG SL SUBL
SUBLINGUAL_TABLET | SUBLINGUAL | Status: AC
  Filled 2024-03-11: qty 1

## 2024-03-11 NOTE — Plan of Care (Signed)

## 2024-03-11 NOTE — Progress Notes (Addendum)
 Advanced Heart Failure Rounding Note  Cardiologist: None  Chief Complaint: OOH Cardiac Arrest Subjective:    EP planning ICD prior to discharge for secondary prevention.  Stress MRI today instead of coronary angiogram d/t elevated Scr.  Scr consistently 2.5-2.8  Feeling well. No dyspnea or chest pain. Ambulated with PT yesterday. Agitation better but still has some cognitive impairment.   Objective:    Weight Range: 99.6 kg Body mass index is 37.67 kg/m.   Vital Signs:   Temp:  [98.1 F (36.7 C)-98.4 F (36.9 C)] 98.4 F (36.9 C) (05/20 0322) Pulse Rate:  [62-72] 69 (05/20 0322) Resp:  [14-26] 14 (05/20 1610) BP: (124-132)/(73-81) 131/74 (05/20 0322) SpO2:  [96 %-100 %] 99 % (05/20 0322) Weight:  [99.6 kg] 99.6 kg (05/20 0637) Last BM Date : 03/10/24  Weight change: Filed Weights   03/07/24 0412 03/10/24 0800 03/11/24 0637  Weight: 102.7 kg 100.9 kg 99.6 kg   Intake/Output: No intake or output data in the 24 hours ending 03/11/24 0806   Physical Exam    General:  Well appearing.  Neck: no JVD.  Cor: Regular rate & rhythm. No rubs, gallops or murmurs. Lungs: clear Abdomen: soft, nontender, nondistended.  Extremities: no edema Neuro: alert & orientedx3. Affect pleasant   Telemetry   SR 60s, PVCs (personally reviewed)  EKG    N/A  Labs    CBC Recent Labs    03/10/24 0347 03/11/24 0417  WBC 9.6 8.7  HGB 12.1 11.7*  HCT 36.5 35.4*  MCV 90.8 91.7  PLT 368 385   Basic Metabolic Panel Recent Labs    96/04/54 0347 03/11/24 0417  NA 134* 133*  K 4.4 4.5  CL 104 103  CO2 20* 20*  GLUCOSE 96 97  BUN 32* 37*  CREATININE 2.58* 2.71*  CALCIUM  8.9 8.6*  MG 1.8 2.0   BNP (last 3 results) Recent Labs    12/17/23 1131 01/02/24 1559 03/02/24 0359  BNP 133.3* 19.0 137.1*   Imaging   No results found.  Medications:    Scheduled Medications:  amiodarone   400 mg Oral BID   amLODipine   10 mg Oral Daily   aspirin   81 mg Oral Daily    carvedilol   12.5 mg Oral BID WC   docusate sodium   100 mg Oral BID   heparin   5,000 Units Subcutaneous Q8H   hydrALAZINE   50 mg Oral Q8H   insulin  aspart  2-6 Units Subcutaneous TID WC   polyethylene glycol  17 g Oral Daily   Infusions:   PRN Medications: LORazepam , menthol -cetylpyridinium, ondansetron  (ZOFRAN ) IV, mouth rinse, polyethylene glycol  Patient Profile   48 y.o. with history of systolic heart failure in 2017, felt PVC mediated. PVCs suppressed w/ Flecainide  and EF improved/normalized. Also w/ HTN, sickle cell anemia, admitted post OOH VF arrest.   Assessment/Plan   1. OOH VF arrest: At least 52 minutes to ROSC.  Initial rhythm was VF per EP review of EMS record.   - She has recovered surprisingly well. Intermittently confused, agitation improved. - Echo EF 55-60% with moderate LVH, normal RV, IVC normal.   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   - Continue amio 400 mg bid - EP following. They recommend evaluation of coronaries, LHC vs CTA, when renal fx normalizes.  - Meets criteria for secondary prevention ICD prior to discharge, timing per EP 2. HF with recovered EF: Echo this admit with  preserved EF, persistent recovery from prior nonischemic cardiomyopathy that was thought to be due to HTN +/- frequent PVCs.  LVH on echo c/w HTN.  Prior cardiac MRI with no delayed enhancement.   - Volume ok. - Continue PVC suppression w/ amiodarone   - BP okay. Continue hydralazine  50 TID 3. HTN: History of resistant HTN.  Has been off meds post-arrest. SBPs now elevated  - With AKI, would stay off spironolactone  and losartan .  - Continue hydralazine  as above 4. AKI: Suspect ATN due to cardiac arrest/shock. SCr peaked at 3.2 but showing s/o recovery.  - Cr stable 2.5-2.8 5. PVCs: Long history of frequent PVCs, thought to contribute to prior cardiomyopathy.  She was initially on amiodarone  then switched to flecainide   when EF recovered.  - continue amio 400 mg bid    Length of Stay: 9  FINCH, LINDSAY N, PA-C  03/11/2024, 8:06 AM  Advanced Heart Failure Team Pager [phone] (M-F; 7a - 5p)  Please contact [facility] for night-coverage after hours (5p -7a ) and weekends on [email or web address]   Patient seen and examined with the above-signed Advanced Practice Provider and/or Housestaff. I personally reviewed laboratory data, imaging studies and relevant notes. I independently examined the patient and formulated the important aspects of the plan. I have edited the note to reflect any of my changes or salient points. I have personally discussed the plan with the patient and/or family.  Feels ok. Eager to go home. Denies CP or SOB  For stress MRI today  Scr up to 2.7  General:  Well appearing. No resp difficulty HEENT: normal Neck: supple. no JVD. Carotids 2+ bilat; no bruits. No lymphadenopathy or thryomegaly appreciated. Cor: PMI nondisplaced. Regular rate & rhythm. No rubs, gallops or murmurs. Lungs: clear Abdomen: obese soft, nontender, nondistended. No hepatosplenomegaly. No bruits or masses. Good bowel sounds. Extremities: no cyanosis, clubbing, rash, edema Neuro: alert & orientedx3, cranial nerves grossly intact. moves all 4 extremities w/o difficulty. Affect pleasant  Scr worse today. Volume status looks ok. Vitals stable.   Continue with plan for stress MRI today.  Currently not cath candidate with AKI  I d/w EP. If renal function not improving may be best to start with LifeVest and give renal function some time to improve. If improves then could go with a transvenous ICD. If no improvement would consider SQ ICD as prefer not to have venous device if she is headed toward HD.   Jules Oar, MD  1:40 PM

## 2024-03-11 NOTE — Progress Notes (Signed)
 Patient presents for stress MRI.  BP 139/84, HR 73.  No caffeine  intake in prior 12 hours. No wheezing on exam.  EKG today shows sinus rhythm with occasional PVC, rate 73   Shared Decision Making/Informed Consent The risks [chest pain, shortness of breath, cardiac arrhythmias, dizziness, blood pressure fluctuations, myocardial infarction, stroke/transient ischemic attack, nausea, vomiting, allergic reaction, and life-threatening complications (estimated to be 1 in 10,000)], benefits (risk stratification, diagnosing coronary artery disease, treatment guidance) and alternatives of a MRI stress test were discussed in detail with patient and they agree to proceed.  Wendie Hamburg, MD

## 2024-03-11 NOTE — Progress Notes (Addendum)
  Note plans for stress MRI today. Will follow up on results re: timing and nature of ICD.   Options include:  S-ICD vs Transvenous ICD pending renal function.   Could also Lifevest and follow up as outpatient for final decision pending progress.   Bambi Lever [address] [place], PA-C  03/11/2024 7:58 AM

## 2024-03-11 NOTE — Plan of Care (Signed)
  Problem: Nutritional: Goal: Maintenance of adequate nutrition will improve Outcome: Progressing   Problem: Tissue Perfusion: Goal: Adequacy of tissue perfusion will improve Outcome: Progressing   

## 2024-03-11 NOTE — Progress Notes (Addendum)
 PROGRESS NOTE  Mandy Serrano WUJ:[id number] DOB: [birth year] DOA: 03/02/2024 PCP: Pcp, No   LOS: 9 days   Brief narrative:  Patient is a 48 y.o. with history of obesity, HTN  was on multiple antihypertensive medication in [place], sickle cell anemia, systolic heart failure 05/2016, headaches, and frequent PVCs was admitted to the hospital on previously in 2017 with dyspnea and hypertension.  She had not been taking any medications for the last 1 year.   Most recent EF was 60 to 65% and was last seen in cardiology office on 18th subsequently patient was found to be unresponsive with downtime of 12 min prior to CPR.   She was coded for 52 min, mostly PEA with periods of V Tach and V fib.  She was shocked several times and she was given multiple rounds of Epinephrine  IVP then started on Epinephrine  drip in ED and vasopressin  was added..  She is intubated and poorly responsive.  Patient was admitted to the hospital in the ICU setting.  Patient was subsequently extubated on 03/03/2024.  Subsequently was transferred out of the ICU to medical service.    At this time patient awaiting for further evaluation by cardiology.  Since her renal function has not improved plan is for cardiac MRI stress test today..  Patient will likely need AICD due to her significant cardiac event.  Assessment/Plan: Principal Problem:   Cardiac arrest [facility]) Active Problems:   Shock (HCC)   Acute respiratory failure with hypoxia (HCC)   AKI (acute kidney injury) (HCC)  Cardiac arrest with ROSC.   Initial rhythm was PEA but then subsequently had VF and VT and needed shocks.  Was initially in the ICU and currently stable and extubated.  PT echocardiogram showed EF of 55 to 60% .  Had a history of PVCs PVCs were thought to be contributing to her cardiomyopathy.  Currently on amiodarone  p.o.  Cardiology on board..  Initial plan for cardiac catheterization but creatinine is still elevated and and slightly uptrending at 2.7 from 2.5 <  2.4 <2.4.  Continue aspirin  and Coreg .  Since creatinine still elevated plan is now for cardiac MRI stress test.  Might need limiteded cardiac catheterization prior to ICD.  Follow cardiology recommendations.  Agitation confusion resolved.  Likely hospital delirium.  Alert awake oriented at this time.  Acute hypoxic respiratory failure-resolved.  Secondary to possible aspiration pneumonia.  Completed course of Unasyn  for 5 days.  On room air at this time.  Acute kidney injury.    Renal ultrasound was negative.  Latest creatinine of 2.7 from 2.5<2.4 < 2.5 <2.8 < 3.2.  Limiting cardiac catheterization due to this.  Congestive heart failure with recovered ejection.  Not volume overloaded.  Will continue to monitor.  Plan for cardiac MRI  History of resistant hypertension.  Was on multiple medications in the past but was not taking medications.  Unable to use spironolactone  or losartan  at this time due to AKI.  Continue Coreg  and hydralazine , amlodipine .  Blood pressure better.  Latest blood pressure of 114/63  Significant PVC burden.  Was initially on amiodarone  drip.  Currently on oral amiodarone  400 milligram twice daily..  Cardiology on board  Hypomagnesemia.  Improved after replacement.  Latest magnesium  of 2.0  Class II obesity. Body mass index is 37.67 kg/m.  Would benefit from lifestyle modification and ongoing weight loss.  History of sickle cell anemia.  As per the records.  Not in acute exacerbation.  Will continue to monitor CBC.  Latest hemoglobin  of 11.7.    Elevated LFTs due to shock liver.  Resolved  Hyperglycemia.  Latest A1c of 5.8.  On sliding scale insulin .  Latest POC glucose of 102   DVT prophylaxis: heparin  injection 5,000 Units Start: 03/02/24 0600 SCDs Start: 03/02/24 0207   Disposition: Physical therapy initially had recommended CIR but at this time patient has clinically improved.  Plan is home on discharge.  Status is: Inpatient  Remains inpatient appropriate  because: Pending cardiac workup, renal failure, status post cardiac arrest,    Code Status:     Code Status: Full Code  Family Communication:  Spoke with the patient's spouse at bedside on 03/11/2024.  Consultants: Cardiology Electrophysiology cardiology PCCM  Procedures: CPR with shocks. Intubation and mechanical ventilation with subsequent extubation,  Anti-infectives:  None currently   Subjective: Today, patient was seen and examined at bedside.  Today, patient denies any chest pain, shortness of breath, dyspnea, fever, chills or rigor.  Patient's spouse at bedside.  Objective: Vitals:   03/11/24 1155 03/11/24 1157  BP: 105/67 114/63  Pulse: 74 76  Resp:    Temp:    SpO2:     No intake or output data in the 24 hours ending 03/11/24 1308  Filed Weights   03/07/24 0412 03/10/24 0800 03/11/24 0637  Weight: 102.7 kg 100.9 kg 99.6 kg   Body mass index is 37.67 kg/m.   Physical Exam:  General: Obese built, not in obvious distress, oriented HENT:   No scleral pallor or icterus noted. Oral mucosa is moist.  Chest:  Clear breath sounds. No crackles or wheezes.  CVS: S1 &S2 heard. No murmur.  Regular rate and rhythm. Abdomen: Soft, nontender, nondistended.  Bowel sounds are heard.   Extremities: No cyanosis, clubbing or edema.  Peripheral pulses are palpable. Psych: Alert, awake and oriented, normal mood CNS:  No cranial nerve deficits.  Power equal in all extremities.   Skin: Warm and dry.  No rashes noted.   Data Review: I have personally reviewed the following laboratory data and studies,  CBC: Recent Labs  Lab 03/06/24 0954 03/07/24 1008 03/08/24 0256 03/10/24 0347 03/11/24 0417  WBC 7.3 8.7 9.4 9.6 8.7  HGB 12.7 12.0 12.2 12.1 11.7*  HCT 38.7 36.5 35.8* 36.5 35.4*  MCV 89.8 89.9 88.4 90.8 91.7  PLT 212 246 303 368 385   Basic Metabolic Panel: Recent Labs  Lab 03/06/24 0954 03/07/24 0508 03/08/24 0828 03/09/24 0331 03/10/24 0347  03/11/24 0417  NA 137 135 134* 134* 134* 133*  K 4.3 4.3 4.1 4.3 4.4 4.5  CL 104 102 100 103 104 103  CO2 22 22 22  20* 20* 20*  GLUCOSE 93 93 105* 97 96 97  BUN 30* 30* 30* 32* 32* 37*  CREATININE 2.54* 2.42* 2.48* 2.48* 2.58* 2.71*  CALCIUM  8.9 8.7* 8.9 8.7* 8.9 8.6*  MG 1.9 2.0 1.7  --  1.8 2.0   Liver Function Tests: Recent Labs  Lab 03/05/24 0335 03/07/24 0508  AST 42* 36  ALT 83* 55*  ALKPHOS 62 55  BILITOT 0.4 0.7  PROT 6.7 6.7  ALBUMIN 3.0* 3.1*   No results for input(s): "LIPASE", "AMYLASE" in the last 168 hours. No results for input(s): "AMMONIA" in the last 168 hours. Cardiac Enzymes: No results for input(s): "CKTOTAL", "CKMB", "CKMBINDEX", "TROPONINI" in the last 168 hours. BNP (last 3 results) Recent Labs    12/17/23 1131 01/02/24 1559 03/02/24 0359  BNP 133.3* 19.0 137.1*    ProBNP (last 3 results)  No results for input(s): "PROBNP" in the last 8760 hours.  CBG: Recent Labs  Lab 03/10/24 1159 03/10/24 1640 03/10/24 2110 03/11/24 0619 03/11/24 1252  GLUCAP 127* 115* 127* 101* 102*   Recent Results (from the past 240 hours)  MRSA Next Gen by PCR, Nasal     Status: None   Collection Time: 03/02/24  2:07 AM   Specimen: Nasal Mucosa; Nasal Swab  Result Value Ref Range Status   MRSA by PCR Next Gen NOT DETECTED NOT DETECTED Final    Comment: (NOTE) The GeneXpert MRSA Assay (FDA approved for NASAL specimens only), is one component of a comprehensive MRSA colonization surveillance program. It is not intended to diagnose MRSA infection nor to guide or monitor treatment for MRSA infections. Test performance is not FDA approved in patients less than 62 years old. Performed at [facility], [address] [place], [place] [address]   Culture, blood (Routine X 2) w Reflex to ID Panel     Status: None   Collection Time: 03/02/24  3:57 AM   Specimen: BLOOD  Result Value Ref Range Status   Specimen Description BLOOD SITE NOT SPECIFIED  Final    Special Requests   Final    BOTTLES DRAWN AEROBIC AND ANAEROBIC Blood Culture results may not be optimal due to an inadequate volume of blood received in culture bottles   Culture   Final    NO GROWTH 5 DAYS Performed at [facility], [address], [place], [place] [address]    Report Status 03/07/2024 FINAL  Final  Culture, blood (Routine X 2) w Reflex to ID Panel     Status: None   Collection Time: 03/02/24  3:58 AM   Specimen: BLOOD  Result Value Ref Range Status   Specimen Description BLOOD SITE NOT SPECIFIED  Final   Special Requests   Final    BOTTLES DRAWN AEROBIC AND ANAEROBIC Blood Culture results may not be optimal due to an inadequate volume of blood received in culture bottles   Culture   Final    NO GROWTH 5 DAYS Performed at [facility], [address] [place], [place] [address]    Report Status 03/07/2024 FINAL  Final  MRSA Next Gen by PCR, Nasal     Status: None   Collection Time: 03/02/24  6:15 AM   Specimen: Nasal Mucosa; Nasal Swab  Result Value Ref Range Status   MRSA by PCR Next Gen NOT DETECTED NOT DETECTED Final    Comment: (NOTE) The GeneXpert MRSA Assay (FDA approved for NASAL specimens only), is one component of a comprehensive MRSA colonization surveillance program. It is not intended to diagnose MRSA infection nor to guide or monitor treatment for MRSA infections. Test performance is not FDA approved in patients less than 47 years old. Performed at [facility], [address], [name], [place] [address]      Studies: No results found.     Rosena Conradi, MD  [facility] 03/11/2024  If 7PM-7AM, please contact night-coverage

## 2024-03-12 ENCOUNTER — Other Ambulatory Visit (HOSPITAL_COMMUNITY): Payer: Self-pay

## 2024-03-12 LAB — GLUCOSE, CAPILLARY
Glucose-Capillary: 98 mg/dL (ref 70–99)
Glucose-Capillary: 123 mg/dL — ABNORMAL HIGH (ref 70–99)
Glucose-Capillary: 106 mg/dL — ABNORMAL HIGH (ref 70–99)

## 2024-03-12 LAB — BASIC METABOLIC PANEL WITH GFR
Anion gap: 8 (ref 5–15)
BUN: 34 mg/dL — ABNORMAL HIGH (ref 6–20)
CO2: 23 mmol/L (ref 22–32)
Calcium: 9 mg/dL (ref 8.9–10.3)
Chloride: 104 mmol/L (ref 98–111)
Creatinine, Ser: 2.4 mg/dL — ABNORMAL HIGH (ref 0.44–1.00)
GFR, Estimated: 24 mL/min — ABNORMAL LOW (ref >60)
Glucose, Bld: 93 mg/dL (ref 70–99)
Potassium: 5 mmol/L (ref 3.5–5.1)
Sodium: 135 mmol/L (ref 135–145)

## 2024-03-12 MED ORDER — AMIODARONE HCL 200 MG PO TABS
400.0000 mg | ORAL_TABLET | Freq: Two times a day (BID) | ORAL | 2 refills | Status: DC
  Filled 2024-03-12: qty 120, 30d supply, fill #0

## 2024-03-12 MED ORDER — HYDRALAZINE HCL 100 MG PO TABS
50.0000 mg | ORAL_TABLET | Freq: Three times a day (TID) | ORAL | 2 refills | Status: DC
  Filled 2024-03-12: qty 45, 30d supply, fill #0

## 2024-03-12 NOTE — TOC Transition Note (Signed)
 Transition of Care (TOC) - Discharge Note Sherin Dingwall RN, BSN Transitions of Care Unit 4E- RN Case Manager See Treatment Team for direct phone #   Patient Details  Name: Mandy Serrano MRN: [id number] Date of Birth: 09/02/1976  Transition of Care [facility]) CM/SW Contact:  Rox Cope, RN Phone Number: 03/12/2024, 3:11 PM   Clinical Narrative:    Per MD pt stable for transition home today, Will need LifeVest for discharge.  Cm reached out to [employer] liaison- per liaison clinicals have not been received and order needs to be modified to match provider who placed note regarding LifeVest need- msg sent to HF team.  Clinicals have been faxed to [employer] as well as to liaison for Life Vest approval. Pt is un-insured so will need to have [facility] approval as well through [employer]. [employer] liaison to follow up for approval.   Once Life Vest has been approved, [employer] will arrange delivery of Vest and set up for fitting at the bedside prior to pt discharge.   Pt to follow up with HF clinic.  Meds to be filled by [facility] pharmacy.      Final next level of care: Home/Self Care Barriers to Discharge: Equipment Delay   Patient Goals and CMS Choice Patient states their goals for this hospitalization and ongoing recovery are:: return home   Choice offered to / list presented to : NA      Discharge Placement               Home        Discharge Plan and Services Additional resources added to the After Visit Summary for     Discharge Planning Services: CM Consult, HF Clinic Post Acute Care Choice: NA          DME Arranged: Life vest DME Agency: [employer] Date DME Agency Contacted: 03/12/24 Time DME Agency Contacted: 1300 Representative spoke with at DME Agency: [facility]t HH Arranged: NA HH Agency: NA        Social Drivers of Health (SDOH) Interventions SDOH Screenings   Food Insecurity: Patient Unable To Answer (03/05/2024)  Financial Resource Strain: Medium Risk (12/17/2023)  Tobacco  Use: Low Risk  (03/07/2024)     Readmission Risk Interventions    03/12/2024    3:11 PM  Readmission Risk Prevention Plan  Medication Screening Complete  Transportation Screening Complete

## 2024-03-12 NOTE — Discharge Summary (Signed)
 Physician Discharge Summary  Mandy Serrano ZOX:[id number] DOB: Sep 18, 1976 DOA: 03/02/2024  PCP: Pcp, No  Admit date: 03/02/2024 Discharge date: 03/12/2024  Admitted From: Home  Discharge disposition: Home   Recommendations for Outpatient Follow-Up:   Follow up with your primary care provider in one week.  Check CBC, BMP, magnesium  in the next visit Follow-up with cardiology on 03/31/2024 as has been scheduled. Patient has been prescribed a LifeVest which will need to be continued on discharge.  Discharge Diagnosis:   Principal Problem:   Cardiac arrest [facility]) Active Problems:   Shock (HCC)   Acute respiratory failure with hypoxia (HCC)   AKI (acute kidney injury) (HCC)   Discharge Condition: Improved.  Diet recommendation: Low sodium, heart healthy.    Wound care: None.  Code status: Full.   History of Present Illness:   Patient is a 48 y.o. with history of obesity, HTN  was on multiple antihypertensive medication in [place], sickle cell anemia, systolic heart failure 05/2016, headaches, and frequent PVCs was admitted to the hospital on previously in 2017 with dyspnea and hypertension.  She had not been taking any medications for the last 1 year.   Most recent EF was 60 to 65% and was last seen in cardiology office on 18th subsequently patient was found to be unresponsive with downtime of 12 min prior to CPR.   She was coded for 52 min, mostly PEA with periods of V Tach and V fib.  She was shocked several times and she was given multiple rounds of Epinephrine  IVP then started on Epinephrine  drip in ED and vasopressin  was added..  She is intubated and poorly responsive.  Patient was admitted to the hospital in the ICU setting.  Patient was subsequently extubated on 03/03/2024.  Subsequently was transferred out of the ICU to medical service.      Hospital Course:   Following conditions were addressed during hospitalization as listed below,  Cardiac arrest with ROSC.   Initial  rhythm was PEA but then subsequently had VF and VT and needed shocks.  Was initially in the ICU and currently stable and extubated.  2D echocardiogram showed EF of 55 to 60% .  Had a history of PVCs PVCs were thought to be contributing to her cardiomyopathy.  Currently on amiodarone  p.o.  Cardiology on board and recommends amiodarone  400 mg twice daily at this time.  Patient will follow-up with cardiology which has been scheduled...  Initial plan for cardiac catheterization but creatinine is still elevated and and slightly uptrending at 2.4 from 2.7  < 2.4 <2.4.  Continue aspirin  and Coreg .  Since creatinine persisted to be elevated stress MRI of performed.  At this time patient has been seen by cardiology in recommend outpatient follow-up in the clinic with continuation of amiodarone , Coreg  on discharge.  Patient has been advised LifeVest prior to discharge.    Agitation confusion resolved.  Likely hospital delirium.  Alert awake oriented at this time.   Acute hypoxic respiratory failure-resolved.   Acute kidney injury.    Renal ultrasound was negative.  Latest creatinine of 2.4 from 2.7 <2.5<2.4 < 2.5 <2.8 < 3.2.  Limiting cardiac catheterization for continued elevated creatinine levels.  Patient will be followed up with cardiology as outpatient.   Congestive heart failure with recovered ejection.  Not volume overloaded.   Status post cardiac stress MRI   History of resistant hypertension.  Was on multiple medications in the past but was not taking medications.  Unable to use spironolactone  or  losartan  at this time due to AKI.  Continue Coreg  and hydralazine , amlodipine  on discharge..  Blood pressure at discharge was 122/68.   Significant PVC burden.  Was initially on amiodarone  drip.  Currently on oral amiodarone  400 milligram twice daily..  Cardiology recommends continuation of amiodarone  on discharge.   Hypomagnesemia.  Improved after replacement.  Latest magnesium  of 2.0   Class II obesity.  Body mass index is 37.67 kg/m.  Would benefit from lifestyle modification and ongoing weight loss.   History of sickle cell anemia.  As per the records.  Not in acute exacerbation.  Will continue to monitor CBC.  Latest hemoglobin of 11.7.     Elevated LFTs due to shock liver.  Resolved   Hyperglycemia.  Prediabetic range.  Latest A1c of 5.8.  Would benefit from lifestyle modifications as outpatient.    Disposition.  At this time, patient is stable for disposition home with outpatient PCP and cardiology follow-up  Medical Consultants:   Cardiology Electrophysiology Cardiology  Procedures:    Stress cardiac MRI CPR with shocks Intubation and mechanical ventilation   Subjective:   Today, patient was seen and examined at bedside.  Denies any chest pain, shortness of breath, dyspnea.  Wishes to go home.  Discharge Exam:   Vitals:   03/12/24 0721 03/12/24 1057  BP: 134/87 122/68  Pulse: 74 63  Resp: 12 16  Temp: 98.1 F (36.7 C) 98 F (36.7 C)  SpO2: 100% 100%   Vitals:   03/11/24 2305 03/12/24 0300 03/12/24 0721 03/12/24 1057  BP: 124/79 137/81 134/87 122/68  Pulse: 69 98 74 63  Resp: 19 20 12 16   Temp: 98 F (36.7 C) 98.1 F (36.7 C) 98.1 F (36.7 C) 98 F (36.7 C)  TempSrc: Oral Oral Oral Oral  SpO2: 96% 98% 100% 100%  Weight:  99.3 kg    Height:        General: Alert awake, not in obvious distress, obese built HENT: pupils equally reacting to light,  No scleral pallor or icterus noted. Oral mucosa is moist.  Chest:  Clear breath sounds.   No crackles or wheezes.  CVS: S1 &S2 heard. No murmur.  Regular rate and rhythm. Abdomen: Soft, nontender, nondistended.  Bowel sounds are heard.   Extremities: No cyanosis, clubbing or edema.  Peripheral pulses are palpable. Psych: Alert, awake and oriented, normal mood CNS:  No cranial nerve deficits.  Power equal in all extremities.   Skin: Warm and dry.  No rashes noted.  The results of significant diagnostics  from this hospitalization (including imaging, microbiology, ancillary and laboratory) are listed below for reference.     Diagnostic Studies:   US  RENAL Result Date: 03/03/2024 CLINICAL DATA:  Acute kidney injury EXAM: RENAL / URINARY TRACT ULTRASOUND COMPLETE COMPARISON:  06/16/2016 FINDINGS: Right Kidney: Renal measurements: 10.1 x 5.5 by 5 cm = volume: 144.8 mL. Echogenic cortex. No hydronephrosis. Cyst with a few scattered internal echoes measuring 17 x 16 x 16 mm, no specific imaging follow-up is recommended. Left Kidney: Renal measurements: 10.8 x 5.1 x 5.2 cm = volume: 149.8 mL. Echogenic cortex. No mass or hydronephrosis. Bladder: Decompressed by Foley catheter Other: None. IMPRESSION: No hydronephrosis. Echogenic renal cortices consistent with medical renal disease. Electronically Signed   By: Esmeralda Hedge M.D.   On: 03/03/2024 15:51   ECHOCARDIOGRAM COMPLETE Result Date: 03/03/2024    ECHOCARDIOGRAM REPORT   Patient Name:   Mandy Serrano Date of Exam: 03/03/2024 Medical Rec #:  [id number]  Height:       64.0 in Accession #:    [id number]    Weight:       224.0 lb Date of Birth:  [birth year]     BSA:          2.053 m Patient Age:    47 years      BP:           145/81 mmHg Patient Gender: F             HR:           80 bpm. Exam Location:  Inpatient Procedure: 2D Echo, Cardiac Doppler, Color Doppler and Intracardiac            Opacification Agent (Both Spectral and Color Flow Doppler were            utilized during procedure). Indications:    Cardiac Arrest I46.9  History:        Patient has prior history of Echocardiogram examinations, most                 recent 10/11/2016.  Sonographer:    Hersey Lorenzo RDCS Referring Phys: [id number] [place] Dk IMPRESSIONS  1. Left ventricular ejection fraction, by estimation, is 55 to 60%. The left ventricle has normal function. The left ventricle has no regional wall motion abnormalities. There is moderate concentric left ventricular hypertrophy. Left  ventricular diastolic parameters are consistent with Grade I diastolic dysfunction (impaired relaxation).  2. Right ventricular systolic function is normal. The right ventricular size is normal. Tricuspid regurgitation signal is inadequate for assessing PA pressure.  3. The mitral valve is normal in structure. No evidence of mitral valve regurgitation. No evidence of mitral stenosis.  4. The aortic valve is tricuspid. Aortic valve regurgitation is trivial.  5. The inferior vena cava is normal in size with greater than 50% respiratory variability, suggesting right atrial pressure of 3 mmHg. FINDINGS  Left Ventricle: Left ventricular ejection fraction, by estimation, is 55 to 60%. The left ventricle has normal function. The left ventricle has no regional wall motion abnormalities. Definity  contrast agent was given IV to delineate the left ventricular  endocardial borders. The left ventricular internal cavity size was normal in size. There is moderate concentric left ventricular hypertrophy. Left ventricular diastolic parameters are consistent with Grade I diastolic dysfunction (impaired relaxation). Right Ventricle: The right ventricular size is normal. No increase in right ventricular wall thickness. Right ventricular systolic function is normal. Tricuspid regurgitation signal is inadequate for assessing PA pressure. Left Atrium: Left atrial size was normal in size. Right Atrium: Right atrial size was normal in size. Pericardium: There is no evidence of pericardial effusion. Mitral Valve: The mitral valve is normal in structure. No evidence of mitral valve regurgitation. No evidence of mitral valve stenosis. Tricuspid Valve: The tricuspid valve is normal in structure. Tricuspid valve regurgitation is not demonstrated. Aortic Valve: The aortic valve is tricuspid. Aortic valve regurgitation is trivial. Pulmonic Valve: The pulmonic valve was normal in structure. Pulmonic valve regurgitation is not visualized. Aorta: The  aortic root is normal in size and structure. Venous: The inferior vena cava is normal in size with greater than 50% respiratory variability, suggesting right atrial pressure of 3 mmHg. IAS/Shunts: No atrial level shunt detected by color flow Doppler.  LEFT VENTRICLE PLAX 2D LVIDd:         4.90 cm   Diastology LVIDs:         3.10 cm   LV e' medial:  5.11 cm/s LV PW:         1.40 cm   LV E/e' medial:  13.1 LV IVS:        1.40 cm   LV e' lateral:   8.38 cm/s LVOT diam:     2.30 cm   LV E/e' lateral: 8.0 LV SV:         78 LV SV Index:   38 LVOT Area:     4.15 cm  IVC IVC diam: 1.60 cm LEFT ATRIUM           Index LA diam:      3.70 cm 1.80 cm/m LA Vol (A4C): 47.3 ml 23.04 ml/m  AORTIC VALVE LVOT Vmax:   98.70 cm/s LVOT Vmean:  59.400 cm/s LVOT VTI:    0.187 m  AORTA Ao Root diam: 3.00 cm Ao Asc diam:  3.10 cm MITRAL VALVE MV Area (PHT): 3.50 cm    SHUNTS MV Decel Time: 217 msec    Systemic VTI:  0.19 m MV E velocity: 67.10 cm/s  Systemic Diam: 2.30 cm MV A velocity: 80.70 cm/s MV E/A ratio:  0.83 Dalton McleanMD Electronically signed by Archer Bear Signature Date/Time: 03/03/2024/11:28:52 AM    Final    EEG adult Result Date: 03/03/2024 Eleni Griffin, MD     03/03/2024  7:12 AM Routine EEG Report Latanya Whobrey is a 48 y.o. female with a history of cardiac arrest who is undergoing an EEG to evaluate for seizures. Report: This EEG was acquired with electrodes placed according to the International 10-20 electrode system (including Fp1, Fp2, F3, F4, C3, C4, P3, P4, O1, O2, T3, T4, T5, T6, A1, A2, Fz, Cz, Pz). The following electrodes were missing or displaced: none. There was no clear waking rhythm. Background consists of burst suppression pattern with bursts containing theta-delta activity of 4-6 Hz. This activity is reactive to stimulation. No sleep architecture identified. There was no focal slowing. There were no interictal epileptiform discharges. There were no electrographic seizures identified. Photic  stimulation and hyperventilation not performed. Impression and clinical correlation: This EEG was obtained while sedated and is abnormal due to burst suppression pattern indicative of global cerebral dysfunction, medication effect or both. Epileptiform abnormalities were not seen during this recording. Greg Leaks, MD [facility] [phone] If 7pm- 7am, please page neurology on call as listed in AMION.   CT Head Wo Contrast Result Date: 03/02/2024 CLINICAL DATA:  Cardiac arrest EXAM: CT HEAD WITHOUT CONTRAST TECHNIQUE: Contiguous axial images were obtained from the base of the skull through the vertex without intravenous contrast. RADIATION DOSE REDUCTION: This exam was performed according to the departmental dose-optimization program which includes automated exposure control, adjustment of the mA and/or kV according to patient size and/or use of iterative reconstruction technique. COMPARISON:  None Available. FINDINGS: Brain: No evidence of acute infarction, hemorrhage, hydrocephalus, extra-axial collection or mass lesion/mass effect. Mild white matter low-density with patchy appearance, likely premature small vessel disease related to chart history of malignant hypertension. Vascular: No hyperdense vessel or unexpected calcification. Skull: Negative Sinuses/Orbits: Generalized mucosal thickening and congested appearance of nasal mucosa. Symmetric proptosis without retro-orbital pathology seen. IMPRESSION: No acute finding.  No visible global anoxic injury. Mild white matter disease likely related to history hypertension. Electronically Signed   By: Ronnette Coke M.D.   On: 03/02/2024 06:04   DG Chest Portable 1 View Result Date: 03/02/2024 CLINICAL DATA:  Central line placement EXAM: PORTABLE CHEST 1 VIEW COMPARISON:  Earlier today FINDINGS: Left IJ  line with tip at the upper cavoatrial junction. No pneumothorax. Endotracheal tube tip between the clavicular heads and carina. Enteric tube  reaches the stomach. Hazy bilateral perihilar to apical opacification. No effusion or pneumothorax seen. Marked cardiopericardial enlargement. IMPRESSION: New central line without complicating features. Stable aeration. Electronically Signed   By: Ronnette Coke M.D.   On: 03/02/2024 05:01   DG Chest Portable 1 View Result Date: 03/02/2024 CLINICAL DATA:  Intubation post arrest EXAM: PORTABLE CHEST 1 VIEW COMPARISON:  Chest x-ray 06/13/2016 FINDINGS: Endotracheal tube with tip terminating approximately 2-3 cm above the carina. Enteric tube courses below the hemidiaphragm with tip overlying the expected region the gastric lumen. Side port not visualized. Cardiac paddles overlie the chest. The heart and mediastinal contours are unchanged. Biapical interstitial airspace opacities. No pleural effusion. No pneumothorax. No acute osseous abnormality. IMPRESSION: 1. Biapical interstitial airspace opacities. Finding could represent edema versus infection. 2.  Lines and tubes as above. Electronically Signed   By: Morgane  Naveau M.D.   On: 03/02/2024 01:04     Labs:   Basic Metabolic Panel: Recent Labs  Lab 03/06/24 0954 03/07/24 0508 03/08/24 0828 03/09/24 0331 03/10/24 0347 03/11/24 0417 03/12/24 0254  NA 137 135 134* 134* 134* 133* 135  K 4.3 4.3 4.1 4.3 4.4 4.5 5.0  CL 104 102 100 103 104 103 104  CO2 22 22 22  20* 20* 20* 23  GLUCOSE 93 93 105* 97 96 97 93  BUN 30* 30* 30* 32* 32* 37* 34*  CREATININE 2.54* 2.42* 2.48* 2.48* 2.58* 2.71* 2.40*  CALCIUM  8.9 8.7* 8.9 8.7* 8.9 8.6* 9.0  MG 1.9 2.0 1.7  --  1.8 2.0  --    GFR Estimated Creatinine Clearance: 32.5 mL/min (A) (by C-G formula based on SCr of 2.4 mg/dL (H)). Liver Function Tests: Recent Labs  Lab 03/07/24 0508  AST 36  ALT 55*  ALKPHOS 55  BILITOT 0.7  PROT 6.7  ALBUMIN 3.1*   No results for input(s): "LIPASE", "AMYLASE" in the last 168 hours. No results for input(s): "AMMONIA" in the last 168 hours. Coagulation  profile No results for input(s): "INR", "PROTIME" in the last 168 hours.  CBC: Recent Labs  Lab 03/06/24 0954 03/07/24 1008 03/08/24 0256 03/10/24 0347 03/11/24 0417  WBC 7.3 8.7 9.4 9.6 8.7  HGB 12.7 12.0 12.2 12.1 11.7*  HCT 38.7 36.5 35.8* 36.5 35.4*  MCV 89.8 89.9 88.4 90.8 91.7  PLT 212 246 303 368 385   Cardiac Enzymes: No results for input(s): "CKTOTAL", "CKMB", "CKMBINDEX", "TROPONINI" in the last 168 hours. BNP: Invalid input(s): "POCBNP" CBG: Recent Labs  Lab 03/11/24 1252 03/11/24 1629 03/11/24 2127 03/12/24 0609 03/12/24 1103  GLUCAP 102* 102* 97 98 123*   D-Dimer No results for input(s): "DDIMER" in the last 72 hours. Hgb A1c No results for input(s): "HGBA1C" in the last 72 hours. Lipid Profile No results for input(s): "CHOL", "HDL", "LDLCALC", "TRIG", "CHOLHDL", "LDLDIRECT" in the last 72 hours. Thyroid  function studies No results for input(s): "TSH", "T4TOTAL", "T3FREE", "THYROIDAB" in the last 72 hours.  Invalid input(s): "FREET3" Anemia work up No results for input(s): "VITAMINB12", "FOLATE", "FERRITIN", "TIBC", "IRON", "RETICCTPCT" in the last 72 hours. Microbiology No results found for this or any previous visit (from the past 240 hours).   Discharge Instructions:   Discharge Instructions     Diet - low sodium heart healthy   Complete by: As directed    Discharge instructions   Complete by: As directed  Follow-up with cardiology as has been scheduled.  Follow-up with your primary care provider in 1 week.  Take medications as prescribed.  Continue to wear the LifeVest as advised.  Seek medical attention for worsening symptoms.   Increase activity slowly   Complete by: As directed       Allergies as of 03/12/2024   No Known Allergies      Medication List     STOP taking these medications    flecainide  100 MG tablet Commonly known as: TAMBOCOR    isosorbide  mononitrate 30 MG 24 hr tablet Commonly known as: IMDUR    losartan   100 MG tablet Commonly known as: COZAAR    spironolactone  25 MG tablet Commonly known as: ALDACTONE        TAKE these medications    acetaminophen  500 MG tablet Commonly known as: TYLENOL  Take 500 mg by mouth every 6 (six) hours as needed for mild pain or headache.   amiodarone  200 MG tablet Commonly known as: PACERONE  Take 2 tablets (400 mg total) by mouth 2 (two) times daily.   amLODipine  10 MG tablet Commonly known as: NORVASC  Take 1 tablet (10 mg total) by mouth daily.   Aspir-Low 81 MG tablet Generic drug: aspirin  EC TAKE 1 TABLET BY MOUTH DAILY.   Blood Pressure Cuff Misc Please check and monitor blood pressure as advised in the clinic   carvedilol  12.5 MG tablet Commonly known as: COREG  Take 1 tablet (12.5 mg total) by mouth 2 (two) times daily with a meal.   hydrALAZINE  100 MG tablet Commonly known as: APRESOLINE  Take 0.5 tablets (50 mg total) by mouth 3 (three) times daily. What changed: how much to take               Durable Medical Equipment  (From admission, onward)           Start     Ordered   03/11/24 1529  For home use only DME Vest life vest  Once       Comments: ZOLL Life Vest   Identify indication. Type yes or no  Cardiac Arrest due to VF or sustained VT: yes Familial or inherited condition with SCA risk: no MI with an EF < 35% or DCM with an EF < 35%: no ICD Explanation:  Other condition with high risk of VT/VF: no  Life Vest Setting  VT 150 bpm VF 200 BPM [id number] Length of need: 3 months  Start Date: 03/12/24   03/11/24 1531            Follow-up Information     [facility] Follow up on 03/31/2024.   Specialty: Cardiology Why: at 9:30 am [facility], Entrance C Contact information: [address] [place]  [address] [phone]        Primary care provider Follow up in 1 week(s).                   Time coordinating discharge: 39  minutes  Signed:  Monti Jilek  [facility] 03/12/2024, 12:59 PM

## 2024-03-12 NOTE — Progress Notes (Signed)
   Stress MRI reviewed with Dr. Lawana Pray and HF team.   Creatinine, ser  2.40* (05/21 0254)  With unclear trajectory of her renal function, will defer ICD decision for several weeks to allow Creatinine to settle.   If normalizes -> would plan transvenous ICD If remains elevated / worsens -> would consider S-ICD   Lifevest will be used as bridge in the meantime  Discussed with patient who is in agreement. Lifevest ordered.   Outpatient follow up made.   EP will see as needed while remains here. Please call with questions or arrhythmia.   Bambi Lever [address]" [place], PA-C  03/12/2024 12:09 PM

## 2024-03-12 NOTE — Progress Notes (Signed)
 Advanced Heart Failure Rounding Note  HF Cardiologist: Dr. Julane Ny EP: Dr. Lawana Pray  Chief Complaint: OOH Cardiac Arrest  Subjective:    Feeling well. No dyspnea or chest pain. Wondering when she can go home   Objective:    Weight Range: 99.3 kg Body mass index is 37.56 kg/m.   Vital Signs:   Temp:  [97.9 F (36.6 C)-98.6 F (37 C)] 98 F (36.7 C) (05/21 1057) Pulse Rate:  [61-98] 63 (05/21 1057) Resp:  [12-20] 16 (05/21 1057) BP: (121-137)/(68-87) 122/68 (05/21 1057) SpO2:  [96 %-100 %] 100 % (05/21 1057) Weight:  [99.3 kg] 99.3 kg (05/21 0300) Last BM Date : 03/11/24  Weight change: Filed Weights   03/10/24 0800 03/11/24 0637 03/12/24 0300  Weight: 100.9 kg 99.6 kg 99.3 kg   Intake/Output:  Intake/Output Summary (Last 24 hours) at 03/12/2024 1203 Last data filed at 03/11/2024 1900 Gross per 24 hour  Intake 360 ml  Output --  Net 360 ml     Physical Exam   General:  Well appearing.  Neck: no JVD.  Cor: Regular rate & rhythm. No rubs, gallops or murmurs. Lungs: clear Abdomen: soft, nontender, nondistended.  Extremities: no edema Neuro: alert & orientedx3. Affect pleasant   EKG    N/A  Labs    CBC Recent Labs    03/10/24 0347 03/11/24 0417  WBC 9.6 8.7  HGB 12.1 11.7*  HCT 36.5 35.4*  MCV 90.8 91.7  PLT 368 385   Basic Metabolic Panel Recent Labs    16/10/96 0347 03/11/24 0417 03/12/24 0254  NA 134* 133* 135  K 4.4 4.5 5.0  CL 104 103 104  CO2 20* 20* 23  GLUCOSE 96 97 93  BUN 32* 37* 34*  CREATININE 2.58* 2.71* 2.40*  CALCIUM  8.9 8.6* 9.0  MG 1.8 2.0  --    BNP (last 3 results) Recent Labs    12/17/23 1131 01/02/24 1559 03/02/24 0359  BNP 133.3* 19.0 137.1*   Imaging   MR CARDIAC STRESS TEST Result Date: 03/11/2024 CLINICAL DATA:  Stress CMR to evaluate for ischemia. 58F p/w cardiac arrest. EXAM: CARDIAC MRI PROCEDURE: PROCEDURE The patient was scanned on a 1.5 Tesla Siemens magnet. A dedicated cardiac coil was  used. Functional imaging was done using Fiesta sequences. 2,3, and 4 chamber views were done to assess for RWMA's. Modified Simpson's rule using a short axis stack was used to calculate an ejection fraction on a dedicated work Research officer, [employer]. Rest and stress (following administration of regadenoson 0.4mg ) first-pass contrast-enhanced imaging was done. The patient received 10 cc of Gadavist. After 10 minutes inversion recovery sequences were used to assess for infiltration and scar tissue. Phase contrast velocity mapping was performed above the aortic and pulmonic valves CONTRAST:  10 cc  of Gadavist FINDINGS: Left ventricle: -Asymmetric hypertrophy measuring 16mm in basal septum (10mm in posterior wall) -Normal size -Normal systolic function -Mild ECV elevation (30%) -Normal T2 values -Apical perfusion defect appears dark rim artifact. Otherwise no stress perfusion defects -RV insertion site LGE. LGE accounts for 4% of total myocardial mass LV EF:  60% (Normal 56-78%) Absolute volumes: LV EDV: (Normal 52-141 mL) LV ESV: 68mL (Normal 13-51 mL) LV SV: (Normal 33-97 mL) CO: 7.8L/min (Normal 2.7-6.0 L/min) Indexed volumes: LV EDV: 36mL/sq-m (Normal 41-81 mL/sq-m) LV ESV: 50mL/sq-m (Normal 12-21 mL/sq-m) LV SV: 66mL/sq-m (Normal 26-56 mL/sq-m) CI: 3.7L/min/sq-m (Normal 1.8-3.8 L/min/sq-m) Right ventricle: Normal size and systolic function RV EF: 58% (Normal 47-80%)  Absolute volumes: RV EDV: (Normal 58-154 mL) RV ESV: 65mL (Normal 12-68 mL) RV SV: 91mL (Normal 35-98 mL) CO: 6.9L/min (Normal 2.7-6 L/min) Indexed volumes: RV EDV: 91mL/sq-m (Normal 48-87 mL/sq-m) RV ESV: 72mL/sq-m (Normal 11-28 mL/sq-m) RV SV: 63mL/sq-m (Normal 27-57 mL/sq-m) CI: 3.3L/min/sq-m (Normal 1.8-3.8 L/min/sq-m) Left atrium: Mild enlargement Right atrium: Normal size Mitral valve: Trivial regurgitation Aortic valve: Tricuspid. Mild regurgitation (regurgitant fraction 10%) Tricuspid valve: Trivial regurgitation  Pulmonic valve: No regurgitation Aorta: Dilated ascending aorta measuring 40mm Pericardium: Normal IMPRESSION: 1. Low risk stress test. There is an apical perfusion defect that appears likely dark rim artifact. No other stress perfusion defects seen. 2. Asymmetric LV hypertrophy measuring 16mm in basal septum (10mm in posterior wall). This meets criteria for hypertrophic cardiomyopathy, though patient's history of resistant hypertension could also have led to LVH 3. RV insertion site LGE, which is a scar pattern that can be seen in HCM or in patients with elevated pulmonary pressures. LGE accounts for 4% of total myocardial mass 4.  Normal LV size and systolic function (EF 60%) 5.  Normal RV size and systolic function (EF 58%) 6.  Dilated ascending aorta measuring 40mm 7.  Mild aortic regurgitation (regurgitant fraction 10%) Electronically Signed   By: Carson Clara M.D.   On: 03/11/2024 17:36   MR CARDIAC VELOCITY FLOW MAP Result Date: 03/11/2024 CLINICAL DATA:  Stress CMR to evaluate for ischemia. 47F p/w cardiac arrest. EXAM: CARDIAC MRI PROCEDURE: PROCEDURE The patient was scanned on a 1.5 Tesla Siemens magnet. A dedicated cardiac coil was used. Functional imaging was done using Fiesta sequences. 2,3, and 4 chamber views were done to assess for RWMA's. Modified Simpson's rule using a short axis stack was used to calculate an ejection fraction on a dedicated work Research officer, trade [employer]. Rest and stress (following administration of regadenoson 0.4mg ) first-pass contrast-enhanced imaging was done. The patient received 10 cc of Gadavist. After 10 minutes inversion recovery sequences were used to assess for infiltration and scar tissue. Phase contrast velocity mapping was performed above the aortic and pulmonic valves CONTRAST:  10 cc  of Gadavist FINDINGS: Left ventricle: -Asymmetric hypertrophy measuring 16mm in basal septum (10mm in posterior wall) -Normal size -Normal systolic function -Mild ECV  elevation (30%) -Normal T2 values -Apical perfusion defect appears dark rim artifact. Otherwise no stress perfusion defects -RV insertion site LGE. LGE accounts for 4% of total myocardial mass LV EF:  60% (Normal 56-78%) Absolute volumes: LV EDV: (Normal 52-141 mL) LV ESV: 68mL (Normal 13-51 mL) LV SV: (Normal 33-97 mL) CO: 7.8L/min (Normal 2.7-6.0 L/min) Indexed volumes: LV EDV: 37mL/sq-m (Normal 41-81 mL/sq-m) LV ESV: 52mL/sq-m (Normal 12-21 mL/sq-m) LV SV: 79mL/sq-m (Normal 26-56 mL/sq-m) CI: 3.7L/min/sq-m (Normal 1.8-3.8 L/min/sq-m) Right ventricle: Normal size and systolic function RV EF: 58% (Normal 47-80%) Absolute volumes: RV EDV: (Normal 58-154 mL) RV ESV: 65mL (Normal 12-68 mL) RV SV: 91mL (Normal 35-98 mL) CO: 6.9L/min (Normal 2.7-6 L/min) Indexed volumes: RV EDV: 82mL/sq-m (Normal 48-87 mL/sq-m) RV ESV: 66mL/sq-m (Normal 11-28 mL/sq-m) RV SV: 26mL/sq-m (Normal 27-57 mL/sq-m) CI: 3.3L/min/sq-m (Normal 1.8-3.8 L/min/sq-m) Left atrium: Mild enlargement Right atrium: Normal size Mitral valve: Trivial regurgitation Aortic valve: Tricuspid. Mild regurgitation (regurgitant fraction 10%) Tricuspid valve: Trivial regurgitation Pulmonic valve: No regurgitation Aorta: Dilated ascending aorta measuring 40mm Pericardium: Normal IMPRESSION: 1. Low risk stress test. There is an apical perfusion defect that appears likely dark rim artifact. No other stress perfusion defects seen. 2. Asymmetric LV hypertrophy measuring 16mm in basal septum (10mm  in posterior wall). This meets criteria for hypertrophic cardiomyopathy, though patient's history of resistant hypertension could also have led to LVH 3. RV insertion site LGE, which is a scar pattern that can be seen in HCM or in patients with elevated pulmonary pressures. LGE accounts for 4% of total myocardial mass 4.  Normal LV size and systolic function (EF 60%) 5.  Normal RV size and systolic function (EF 58%) 6.  Dilated ascending aorta measuring 40mm 7.   Mild aortic regurgitation (regurgitant fraction 10%) Electronically Signed   By: Carson Clara M.D.   On: 03/11/2024 17:36   MR CARDIAC VELOCITY FLOW MAP Result Date: 03/11/2024 CLINICAL DATA:  Stress CMR to evaluate for ischemia. 26F p/w cardiac arrest. EXAM: CARDIAC MRI PROCEDURE: PROCEDURE The patient was scanned on a 1.5 Tesla Siemens magnet. A dedicated cardiac coil was used. Functional imaging was done using Fiesta sequences. 2,3, and 4 chamber views were done to assess for RWMA's. Modified Simpson's rule using a short axis stack was used to calculate an ejection fraction on a dedicated work Research officer, trade [employer]. Rest and stress (following administration of regadenoson 0.4mg ) first-pass contrast-enhanced imaging was done. The patient received 10 cc of Gadavist. After 10 minutes inversion recovery sequences were used to assess for infiltration and scar tissue. Phase contrast velocity mapping was performed above the aortic and pulmonic valves CONTRAST:  10 cc  of Gadavist FINDINGS: Left ventricle: -Asymmetric hypertrophy measuring 16mm in basal septum (10mm in posterior wall) -Normal size -Normal systolic function -Mild ECV elevation (30%) -Normal T2 values -Apical perfusion defect appears dark rim artifact. Otherwise no stress perfusion defects -RV insertion site LGE. LGE accounts for 4% of total myocardial mass LV EF:  60% (Normal 56-78%) Absolute volumes: LV EDV: (Normal 52-141 mL) LV ESV: 68mL (Normal 13-51 mL) LV SV: (Normal 33-97 mL) CO: 7.8L/min (Normal 2.7-6.0 L/min) Indexed volumes: LV EDV: 78mL/sq-m (Normal 41-81 mL/sq-m) LV ESV: 32mL/sq-m (Normal 12-21 mL/sq-m) LV SV: 2mL/sq-m (Normal 26-56 mL/sq-m) CI: 3.7L/min/sq-m (Normal 1.8-3.8 L/min/sq-m) Right ventricle: Normal size and systolic function RV EF: 58% (Normal 47-80%) Absolute volumes: RV EDV: (Normal 58-154 mL) RV ESV: 65mL (Normal 12-68 mL) RV SV: 91mL (Normal 35-98 mL) CO: 6.9L/min (Normal 2.7-6 L/min)  Indexed volumes: RV EDV: 110mL/sq-m (Normal 48-87 mL/sq-m) RV ESV: 62mL/sq-m (Normal 11-28 mL/sq-m) RV SV: 32mL/sq-m (Normal 27-57 mL/sq-m) CI: 3.3L/min/sq-m (Normal 1.8-3.8 L/min/sq-m) Left atrium: Mild enlargement Right atrium: Normal size Mitral valve: Trivial regurgitation Aortic valve: Tricuspid. Mild regurgitation (regurgitant fraction 10%) Tricuspid valve: Trivial regurgitation Pulmonic valve: No regurgitation Aorta: Dilated ascending aorta measuring 40mm Pericardium: Normal IMPRESSION: 1. Low risk stress test. There is an apical perfusion defect that appears likely dark rim artifact. No other stress perfusion defects seen. 2. Asymmetric LV hypertrophy measuring 16mm in basal septum (10mm in posterior wall). This meets criteria for hypertrophic cardiomyopathy, though patient's history of resistant hypertension could also have led to LVH 3. RV insertion site LGE, which is a scar pattern that can be seen in HCM or in patients with elevated pulmonary pressures. LGE accounts for 4% of total myocardial mass 4.  Normal LV size and systolic function (EF 60%) 5.  Normal RV size and systolic function (EF 58%) 6.  Dilated ascending aorta measuring 40mm 7.  Mild aortic regurgitation (regurgitant fraction 10%) Electronically Signed   By: Carson Clara M.D.   On: 03/11/2024 17:36    Medications:    Scheduled Medications:  amiodarone   400 mg Oral BID   amLODipine   10 mg Oral Daily   aspirin   81 mg Oral Daily   carvedilol   12.5 mg Oral BID WC   docusate sodium   100 mg Oral BID   heparin   5,000 Units Subcutaneous Q8H   hydrALAZINE   50 mg Oral Q8H   insulin  aspart  2-6 Units Subcutaneous TID WC   Infusions:   PRN Medications: LORazepam , menthol -cetylpyridinium, ondansetron  (ZOFRAN ) IV, mouth rinse, polyethylene glycol  Patient Profile   48 y.o. with history of systolic heart failure in 2017, felt PVC mediated. PVCs suppressed w/ Flecainide  and EF improved/normalized. Also w/ HTN, sickle cell  anemia, admitted post OOH VF arrest.   Assessment/Plan   1. OOH VF arrest: At least 52 minutes to ROSC.  Initial rhythm was VF per EP review of EMS record.   - She has recovered surprisingly well. Intermittently confused, agitation improved. - Echo EF 55-60% with moderate LVH, normal RV, IVC normal.   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   - Continue amio 400 mg bid - Meets criteria for secondary prevention ICD prior to discharge, timing per EP. Awaiting renal recovery to determine whether she needs subQ device vs transvenous ICD. Ideally would have cardiac cath prior but renal function prohibitive. Stress MRI did not suggest ischemic cardiomyopathy. Discussed with EP today. Recommend LifeVest and outpatient follow-up.   2. 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  LVH on echo c/w HTN.  Prior cardiac MRI with no delayed enhancement.   - Stress MRI this admit w/ no significant stress perfusion defects, LVEF 60%, asymmetric LVH (uncertain if HCM vs HTN), nonspecific RV insertion LGE, RVEF 58% - May need genetic testing for HCM as outpatient - Volume ok. - Continue PVC suppression w/ amiodarone   - BP okay. Continue hydralazine  50 TID and Coreg  12.5 BID. Also on Coreg  3. HTN: History of resistant HTN.  Has been off meds post-arrest. SBPs reasonable, would not aggressively treated with AKI - With AKI, would stay off spironolactone  and losartan .  - Continue hydralazine , amlodipine  and coreg  as above 4. AKI: Suspect ATN due to cardiac arrest/shock. SCr peaked at 3.2 but showing s/o recovery.  - Cr stable 2.5-2.8 5. PVCs: Long history of frequent PVCs, thought to contribute to prior cardiomyopathy.  She was initially on amiodarone  then switched to flecainide  when EF recovered.  - continue amio 400 mg bid  Okay  for discharge from HF standpoint once she gets her LifeVest.  Has outpatient f/u scheduled.  Discharge meds: Amiodarone  400 mg BID Amlodipine  10 mg daily Aspirin  81 mg daily Coreg  12.5 BID Hydralazine  50 TID   Length of Stay: 10  Amiere Cawley N, PA-C  03/12/2024, 12:03 PM  Advanced Heart Failure Team Pager [phone] (M-F; 7a - 5p)  Please contact [facility] for night-coverage after hours (5p -7a ) and weekends on [email or web address]

## 2024-03-12 NOTE — Progress Notes (Signed)
 Occupational Therapy Treatment Patient Details Name: Mandy Serrano MRN: [id number] DOB: 1976-10-21 Today's Date: 03/12/2024   History of present illness 48 yo female admitted 5/11 after found down approximately prior to CPR of 52 minutes. Intubated 5/11-5/12. 5/12 EEG with global cerebral dysfunction. PMhx: HTN, CHF, obesity, PVC, sickle cell anemia.   OT comments  Pt making progress with functional goals. Pt seated EOB upon arrival, pleasant and cooperative. Pt asking when she will be discharged. Does not appear to understand/remember events of hospitalization. Provided pt with a journal (with pt participation to create) to assist her in remembering day to day at the hospital, for home use and monthly calendar (May and June) to assist in remembering past and upcoming events/info as well as education of an app for her iphone to assist with remembering events, calls to [facility], appointments, etc... Pt verbalized understanding and asked if she could take journal home. Pt participated in ADL and ADL mobility activities with Sup for safety. OT will continue to follow acutely to maximize level of function and safety      If plan is discharge home, recommend the following:  Assistance with cooking/housework;Direct supervision/assist for financial management;Direct supervision/assist for medications management;Assist for transportation   Equipment Recommendations  Tub/shower seat    Recommendations for Other Services      Precautions / Restrictions Precautions Precautions: Fall Recall of Precautions/Restrictions: Impaired Restrictions Weight Bearing Restrictions Per Provider Order: No       Mobility Bed Mobility                    Transfers                         Balance                                           ADL either performed or assessed with clinical judgement   ADL                                               Extremity/Trunk Assessment              Vision       Perception     Praxis     Communication     Cognition Arousal: Alert Behavior During Therapy: [facility] for tasks assessed/performed Cognition: Cognition impaired       Memory impairment (select all impairments): Short-term memory, Working [employer] fast streamer, Armed forces training and education officer functioning impairment (select all impairments): Problem solving, Reasoning OT - Cognition Comments: Pt asking when she will be discharged. Does not appear to understand/remember events of hospitalization. Provided pt with a journal (with pt participation to create) to assist her in remembering day to day at the hospital, for home use and monthly calendar (May and June) to assist in remembering past and upcoming events/info as well as education of  an app for her iphone to assist with remembering events, calls to [facility], appointments, etc... Pt verbalized understanding and asked if she could take journal home                          Cueing      Exercises  Shoulder Instructions       General Comments      Pertinent Vitals/ Pain       Pain Assessment Pain Assessment: No/denies pain Faces Pain Scale: No hurt  Home Living                                          Prior Functioning/Environment              Frequency  Min 2X/week        Progress Toward Goals  OT Goals(current goals can now be found in the care plan section)  Progress towards OT goals: Progressing toward goals     Plan      Co-evaluation                 AM-PAC OT "6 Clicks" Daily Activity     Outcome Measure   Help from another person eating meals?: None Help from another person taking care of personal grooming?: None Help from another person toileting, which includes using toliet, bedpan, or urinal?: A Little Help from another person bathing (including washing, rinsing, drying)?: A Little Help from  another person to put on and taking off regular upper body clothing?: None Help from another person to put on and taking off regular lower body clothing?: A Little 6 Click Score: 21    End of Session    OT Visit Diagnosis: Unsteadiness on feet (R26.81);Other abnormalities of gait and mobility (R26.89);Muscle weakness (generalized) (M62.81);Other symptoms and signs involving cognitive function   Activity Tolerance Patient tolerated treatment well   Patient Left in bed;with call bell/phone within reach;Other (comment) (sitting EOB)   Nurse Communication Mobility status;Other (comment) (journal composition)        Time: 1610-9604 OT Time Calculation (min): 32 min  Charges: OT General Charges $OT Visit: 1 Visit OT Treatments $Self Care/Home Management : 8-22 mins $Therapeutic Activity: 8-22 mins    Alfred Ann 03/12/2024, 1:57 PM

## 2024-03-12 NOTE — Discharge Instructions (Signed)
Nutrition Post Hospital Stay Proper nutrition can help your body recover from illness and injury.   Foods and beverages high in protein, vitamins, and minerals help rebuild muscle loss, promote healing, & reduce fall risk.   .In addition to eating healthy foods, a nutrition shake is an easy, delicious way to get the nutrition you need during and after your hospital stay  It is recommended that you continue to drink 2 bottles per day of:       Ensure for at least 1 month (30 days) after your hospital stay   Tips for adding a nutrition shake into your routine: As allowed, drink one with vitamins or medications instead of water or juice Enjoy one as a tasty mid-morning or afternoon snack Drink cold or make a milkshake out of it Drink one instead of milk with cereal or snacks Use as a coffee creamer   Available at the following grocery stores and pharmacies:           * [employer] * [employer] * [employer]  * [employer]          * [employer] * [employer]  * [facility]      * Target  * BJ's   * [facility]  * [employer]   *  Outpatient Pharmacy [phone]            For COUPONS visit: [email or web address] or [email or web address]   Suggested Substitutions Ensure Plus = Boost Plus = Carnation Breakfast Essentials = Boost Compact Ensure Active Clear = Boost Breeze Glucerna Shake = Boost Glucose Control = Carnation Breakfast Essentials SUGAR FREE       

## 2024-03-12 NOTE — Progress Notes (Signed)
 Mobility Specialist Progress Note:   03/12/24 0913  Mobility  Activity Ambulated independently in hallway;Ambulated independently in room  Level of Assistance Independent  Assistive Device None  Distance Ambulated (ft) 500 ft  Activity Response Tolerated well  Mobility Referral Yes  Mobility visit 1 Mobility  Mobility Specialist Start Time (ACUTE ONLY) 0911  Mobility Specialist Stop Time (ACUTE ONLY) 0915  Mobility Specialist Time Calculation (min) (ACUTE ONLY) 4 min   Pt received in chair, agreeable to mobility session. Ambulated independently, no AD required. Tolerated well, asx throughout. No LOB or unsteadiness. Returned pt to room, left with all needs met.   Mandy Serrano Mobility Specialist Please contact via Special educational needs teacher or  Rehab office at [phone]

## 2024-03-21 ENCOUNTER — Ambulatory Visit (INDEPENDENT_AMBULATORY_CARE_PROVIDER_SITE_OTHER): Payer: Self-pay | Admitting: Cardiovascular Disease

## 2024-03-21 ENCOUNTER — Other Ambulatory Visit (HOSPITAL_COMMUNITY): Payer: Self-pay

## 2024-03-21 ENCOUNTER — Encounter (HOSPITAL_BASED_OUTPATIENT_CLINIC_OR_DEPARTMENT_OTHER): Payer: Self-pay | Admitting: Cardiovascular Disease

## 2024-03-21 VITALS — BP 133/87 | HR 61 | Ht 64.0 in | Wt 224.6 lb

## 2024-03-21 DIAGNOSIS — R7989 Other specified abnormal findings of blood chemistry: Secondary | ICD-10-CM

## 2024-03-21 DIAGNOSIS — I10 Essential (primary) hypertension: Secondary | ICD-10-CM

## 2024-03-21 DIAGNOSIS — I469 Cardiac arrest, cause unspecified: Secondary | ICD-10-CM

## 2024-03-21 DIAGNOSIS — I5021 Acute systolic (congestive) heart failure: Secondary | ICD-10-CM

## 2024-03-21 DIAGNOSIS — I493 Ventricular premature depolarization: Secondary | ICD-10-CM

## 2024-03-21 MED ORDER — HYDRALAZINE HCL 100 MG PO TABS
100.0000 mg | ORAL_TABLET | Freq: Three times a day (TID) | ORAL | 3 refills | Status: DC
  Filled 2024-03-21 – 2024-03-28 (×2): qty 90, 30d supply, fill #0
  Filled 2024-04-23: qty 90, 30d supply, fill #1
  Filled 2024-05-22: qty 90, 30d supply, fill #2
  Filled 2024-06-26: qty 90, 30d supply, fill #3

## 2024-03-21 NOTE — Progress Notes (Signed)
 Advanced Hypertension Clinic Initial Assessment:    Date:  03/21/2024   ID:  Mandy Serrano, DOB 19-Oct-1976, MRN [id number]  PCP:  Pcp, No  Cardiologist:  None  Nephrologist:  Referring MD: Elmarie Hacking, FNP   CC: Hypertension  History of Present Illness:    Mandy Serrano is a 48 y.o. female with a hx of HFimpEF, cardiac arrest, hypertension, PVCs, hypertension, and sickle cell anemia  here to establish care in the Advanced Hypertension Clinic.  She was admitted to the hospital in 2017 with hypertensive emergency and acute heart failure.  She had not been on her antihypertensive for about a year.  Echo at the time revealed LV EF 25-30% with moderate LVH.  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  Renal Dopplers were negative for renal artery stenosis.  She had 16% PVCs and was started on amiodarone .  This was eventually switched to flecainide  as her LVEF improved.  Systolic function improved with blood pressure management.    She was admitted 02/2020 with witnessed cardiac arrest.  Downtime was 12 minutes prior to the initiation of CPR.  It was initially PEA and then VT/VF which was treated with defibrillation.  Initial K was 6.2.  Hospitalization was complicated by AKI.  She had complete neurologic recovery.  Due to her AKI she underwent stress MRI instead of cardiac catheterization 02/2024.  She was found to have asymmetric septal hypertrophy measuring up to 1.6 cm.  This met criteria for hypertrophic cardiomyopathy, though there was a question of whether resistant hypertension may have been contributing.  There was some LGE at the RV insertion site which can be seen in HCM or people with elevated pulmonary pressures.  Ascending aorta was dilated at 4.0 cm.  It was low risk for ischemia.  Echo revealed LVEF 55-60% with grade 1 diastolic dysfunction and moderate LVH.  She was discharged with a LifeVest.  Spironolactone  and losartan  were discontinued due to AKI.  She can be  discharged on carvedilol , hydralazine , and amlodipine  and blood pressure was well-controlled.  Discussed the use of AI scribe software for clinical note transcription with the patient, who gave verbal consent to proceed.  History of Present Illness Mandy Serrano has a long-standing history of hypertension, reportedly diagnosed as early as age 45. Her blood pressure has been difficult to control, fluctuating despite medication use. She experiences dizziness as a side effect of her medications. Her current blood pressure readings at home are around 150/80 mmHg, with a recent reading of 155/80 mmHg, which is the highest it typically reaches. Her medications were adjusted during a recent hospital stay due to kidney concerns.  Her family history is significant for hypertension in her parents and paternal grandmother. There is no known family history of heart attacks, heart failure, or strokes.  In terms of lifestyle, she engages in some physical activity, such as walking and gardening. She is mindful of her sodium intake and does not consume caffeine  or alcohol. She occasionally uses Tylenol  for pain and has used Aleve in the past.  No current snoring, and she does not feel the PVCs she has been diagnosed with.  Previous antihypertensives:    Past Medical History:  Diagnosis Date   Malignant hypertension     Past Surgical History:  Procedure Laterality Date   CESAREAN SECTION      Current Medications: Current Meds  Medication Sig   acetaminophen  (TYLENOL ) 500 MG tablet Take 500 mg by mouth every 6 (six) hours as needed for  mild pain or headache.   amiodarone  (PACERONE ) 200 MG tablet Take 2 tablets (400 mg total) by mouth 2 (two) times daily.   ASPIR-LOW 81 MG EC tablet TAKE 1 TABLET BY MOUTH DAILY.   Blood Pressure Monitoring (BLOOD PRESSURE CUFF) MISC Please check and monitor blood pressure as advised in the clinic   carvedilol  (COREG ) 12.5 MG tablet Take 1 tablet (12.5 mg total) by  mouth 2 (two) times daily with a meal.   [DISCONTINUED] hydrALAZINE  (APRESOLINE ) 100 MG tablet Take 0.5 tablets (50 mg total) by mouth 3 (three) times daily.     Allergies:   Patient has no known allergies.   Social History   Socioeconomic History   Marital status: Married    Spouse name: Not on file   Number of children: Not on file   Years of education: Not on file   Highest education level: Not on file  Occupational History   Not on file  Tobacco Use   Smoking status: Never    Passive exposure: Past   Smokeless tobacco: Never  Substance and Sexual Activity   Alcohol use: No   Drug use: No   Sexual activity: Not on file  Other Topics Concern   Not on file  Social History Narrative   Not on file   Social Drivers of Health   Financial Resource Strain: Medium Risk (12/17/2023)   Overall Financial Resource Strain (CARDIA)    Difficulty of Paying Living Expenses: Somewhat hard  Food Insecurity: No Food Insecurity (03/21/2024)   Hunger Vital Sign    Worried About Running Out of Food in the Last Year: Never true    Ran Out of Food in the Last Year: Never true  Transportation Needs: No Transportation Needs (03/21/2024)   PRAPARE - Administrator, Civil Service (Medical): No    Lack of Transportation (Non-Medical): No  Physical Activity: Not on file  Stress: Not on file  Social Connections: Not on file     Family History: The patient's family history includes Hypertension in her father, mother, and paternal grandmother.  ROS:   Please see the history of present illness.     All other systems reviewed and are negative.  EKGs/Labs/Other Studies Reviewed:    EKG:  EKG is not ordered today.  Stress MRI 03/11/24: IMPRESSION: 1. Low risk stress test. There is an apical perfusion defect that appears likely dark rim artifact. No other stress perfusion defects seen.   2. Asymmetric LV hypertrophy measuring 16mm in basal septum (10mm in posterior wall). This  meets criteria for hypertrophic cardiomyopathy, though patient's history of resistant hypertension could also have led to LVH   3. RV insertion site LGE, which is a scar pattern that can be seen in HCM or in patients with elevated pulmonary pressures. LGE accounts for 4% of total myocardial mass   4.  Normal LV size and systolic function (EF 60%)   5.  Normal RV size and systolic function (EF 58%)   6.  Dilated ascending aorta measuring 40mm   7.  Mild aortic regurgitation (regurgitant fraction 10%)  Echo 03/03/24:  1. Left ventricular ejection fraction, by estimation, is 55 to 60%. The  left ventricle has normal function. The left ventricle has no regional  wall motion abnormalities. There is moderate concentric left ventricular  hypertrophy. Left ventricular  diastolic parameters are consistent with Grade I diastolic dysfunction  (impaired relaxation).   2. Right ventricular systolic function is normal. The right ventricular  size is normal. Tricuspid regurgitation signal is inadequate for assessing  PA pressure.   3. The mitral valve is normal in structure. No evidence of mitral valve  regurgitation. No evidence of mitral stenosis.   4. The aortic valve is tricuspid. Aortic valve regurgitation is trivial.   5. The inferior vena cava is normal in size with greater than 50%  respiratory variability, suggesting right atrial pressure of 3 mmHg.   Recent Labs: 03/02/2024: B Natriuretic Peptide 137.1 03/07/2024: ALT 55; TSH 5.637 03/11/2024: Hemoglobin 11.7; Magnesium  2.0; Platelets 385 03/12/2024: BUN 34; Creatinine, Ser 2.40; Potassium 5.0; Sodium 135   Recent Lipid Panel    Component Value Date/Time   CHOL 223 (H) 09/19/2016 1436   TRIG 110 03/03/2024 0437   HDL 51 09/19/2016 1436   CHOLHDL 4.4 09/19/2016 1436   VLDL 21 09/19/2016 1436   LDLCALC 151 (H) 09/19/2016 1436    Physical Exam:   VS:  BP 133/87 (BP Location: Right Arm, Patient Position: Sitting, Cuff Size: Large)    Pulse 61   Ht 5\' 4"  (1.626 m)   Wt 224 lb 9.6 oz (101.9 kg)   SpO2 100%   BMI 38.55 kg/m  , BMI Body mass index is 38.55 kg/m. GENERAL:  Well appearing HEENT: Pupils equal round and reactive, fundi not visualized, oral mucosa unremarkable NECK:  No jugular venous distention, waveform within normal limits, carotid upstroke brisk and symmetric, no bruits, no thyromegaly LYMPHATICS:  No cervical adenopath LUNGS:  Clear to auscultation bilaterally HEART:  RRR.  PMI not displaced or sustained,S1 and S2 within normal limits, no S3, no S4, no clicks, no rubs, no murmurs ABD:  Flat, positive bowel sounds normal in frequency in pitch, no bruits, no rebound, no guarding, no midline pulsatile mass, no hepatomegaly, no splenomegaly EXT:  2 plus pulses throughout, no edema, no cyanosis no clubbing SKIN:  No rashes no nodules NEURO:  Cranial nerves II through XII grossly intact, motor grossly intact throughout PSYCH:  Cognitively intact, oriented to person place and time   ASSESSMENT/PLAN:    Assessment & Plan # Hypertensive heart disease: # HFimpEF: Hypertension difficult to control, elevated BP at 155/80 mmHg. Echocardiogram shows septal hypertrophy.  Unclear whether this is due to hypertensive heart disease or HCM.  She is euvolemic on exam. - Increase hydralazine  to100mg  three times daily. - Provide a blood pressure tracking sheet for home monitoring.  Blood pressure goal is less than 130/80. - Order blood tests for renin, aldosterone, catecholamines, and metanephrines.  To rule out secondary causes. - Order a basic metabolic panel to assess kidney function given her recent AKI. - Recheck thyroid  function with TSH given mild abnormality in the hospital. - Continue amlodipine  and carvedilol   # Cardiac arrest: # Frequent PVCs: Presence of PVCs and recent cardiac arrest due to arrhythmia.  Continue amiodarone  as prescribed.  Discussed defibrillator implantation contingent on improved kidney  function.  She has follow-up with the EP later this month and is currently wearing a LifeVest. - Continue amiodarone  and carvedilol .  # AKI: Recent kidney dysfunction likely secondary to cardiac arrest, with prior normal function. - Order a basic metabolic panel to monitor kidney function since discharge - Recommend Tylenol .  Avoid NSAIDs or other nephrotoxins.  # Thyroid  function abnormality Abnormal thyroid  function potentially related to recent cardiac arrest and physiological stress. - Recheck thyroid  function with TSH.    Screening for Secondary Hypertension:     03/21/2024    9:29 AM  Causes  Drugs/Herbals  Screened     - Comments limits caffeine .  no EtOH or supplements.  Rare NSAIDs.  Limits sodium.  Renovascular HTN Screened  Sleep Apnea Screened     - Comments no snoring or apnea  Thyroid  Disease Screened  Hyperaldosteronism Screened  Pheochromocytoma Screened     - Comments check catecholamines/metaneprines  Cushing's Syndrome N/A  Hyperparathyroidism Screened  Coarctation of the Aorta Screened     - Comments BP asymmetric  Compliance Screened    Relevant Labs/Studies:    Latest Ref Rng & Units 03/12/2024    2:54 AM 03/11/2024    4:17 AM 03/10/2024    3:47 AM  Basic Labs  Sodium 135 - 145 mmol/L 135  133  134   Potassium 3.5 - 5.1 mmol/L 5.0  4.5  4.4   Creatinine 0.44 - 1.00 mg/dL 1.61  0.96  0.45        Latest Ref Rng & Units 03/07/2024    5:08 AM 12/17/2023   11:31 AM  Thyroid    TSH 0.350 - 4.500 uIU/mL 5.637  2.446                 06/13/2016    9:11 AM  Renovascular   Renal Artery US  Completed Yes      Disposition:    FU with MD/PharmD in 2 months    Medication Adjustments/Labs and Tests Ordered: Current medicines are reviewed at length with the patient today.  Concerns regarding medicines are outlined above.  Orders Placed This Encounter  Procedures   Metanephrines, plasma   Catecholamines, fractionated, plasma   Aldosterone + renin  activity w/ ratio   Basic metabolic panel with GFR   T4, free   TSH   T3   Meds ordered this encounter  Medications   hydrALAZINE  (APRESOLINE ) 100 MG tablet    Sig: Take 1 tablet (100 mg total) by mouth 3 (three) times daily.    Dispense:  90 tablet    Refill:  3    NEW DOSE,D/C PREVIOUS RX PATIENT WILL CALL WHEN NEEDS FILLED     Signed, Maudine Sos, MD  03/21/2024 1:52 PM    [facility]

## 2024-03-21 NOTE — Progress Notes (Signed)
 Heart and Vascular Care Navigation  03/21/2024  Mandy Serrano January 09, 1976 [id number]  Reason for Referral: self pay, no PCP Patient is participating in a Managed Medicaid Plan:No, self pay only  Engaged with patient face to face for initial visit for Heart and Vascular Care Coordination.                                                                                                   Assessment:           LCSW met with pt and pt spouse today as pt noted to be self pay, has emergency Medicaid application pending from recent hospital stay. Multiple notes in chart review from LCSWs at HF clinic. Introduced self, role, reason for visit. Pt confirmed home address, ensured up to date contact information for pt/pt spouse. No issues noted with transportation or costs of living at this time. They have no issue with obtaining/affording medications- note use of HF fund for some medications at [facility] Pt and pt spouse do not recall meeting with LCSWs at HF clinic despite several notes. Discussed resources provided in February for assistance with medical bills but they are not sure. So we reviewed that Emergency Medicaid applied with hospital assistance will only cover the expenses from that visit and otherwise that pt is not eligible for ongoing Medicaid per notes from financial counseling. Pt can apply for [employer] (provided these applications) and can complete them and bring applications and supporting documents back in to LCSW or can mail directly to program. Requested before dropping off or bringing any paperwork to appointments or clinic that they should give me a call and provided them my card. Encouraged them to let me know while completing applciations if any issues.   Inquired if I could set pt up with PCP appt and they are agreeable, pt husband requested a provider that would spend a lot of time with pt and provide "the best care." I shared I will set up an appt  and if they are not pleased that they are able to contact another provider and they are agreeable to me mailing that information. Will f/u on this when [facility] Mandy Serrano, at [facility] back in clinic.                              HRT/VAS Care Coordination     Patients Home Cardiology Office Heart Failure Clinic   Outpatient Care Team Social Worker   Social Worker Name: Nathen Balder, [place], [phone]   Living arrangements for the past 2 months Single Family Home   Lives with: Spouse   Patient Current Insurance Coverage Self-Pay   Patient Has Concern With Paying Medical Bills Yes   Patient Concerns With Medical Bills only eligible at this time for Emergency Medicaid   Medical Bill Referrals: [facility] Financial Assistance application and [employer] Card application   Does Patient Have Prescription Coverage? Yes   Patient Prescription Assistance Programs Heart Failure [facility] Devices/Equipment None   DME Agency [facility]  HH Agency NA       Social History:                                                                             SDOH Screenings   Food Insecurity: No Food Insecurity (03/21/2024)  Housing: Low Risk  (03/21/2024)  Transportation Needs: No Transportation Needs (03/21/2024)  Utilities: Not At Risk (03/21/2024)  Financial Resource Strain: Medium Risk (12/17/2023)  Tobacco Use: Low Risk  (03/07/2024)  Health Literacy: Inadequate Health Literacy (03/21/2024)    SDOH Interventions: Financial Resources:   Physicist, medical Interventions: Editor, commissioning for [employer] Program  Food Insecurity:  Food Insecurity Interventions: Intervention Not Indicated  Housing Insecurity:  Housing Interventions: Intervention Not Indicated  Transportation:   Transportation Interventions: Intervention Not Indicated    Other Care Navigation Interventions:     Provided Pharmacy assistance resources Heart Failure Fund   Follow-up plan:   LCSW provided pt with information and  applications for [employer] and [employer]. Discussed how to complete and how to get back in touch with this writer if assistance needed with submitting them and provided my card. Will f/u to ensure no additional needs and schedule PCP appt.

## 2024-03-21 NOTE — Patient Instructions (Addendum)
 Medication Instructions:  INCREASE HYDRALAZINE  TO 100 MG THREE TIMES A DAY   Labwork: RENIN/ALDOSTERONE/CATACHOLAMINES/METANEPHRINE/TSH/FT4/T3/BMET  TODAY   Testing/Procedures: NONE  Follow-Up: 2 MONTHS WITH EITHER KRISTIN A PHARM D, DR Ramsey, OR CAITLIN W NP   Any Other Special Instructions Will Be Listed Below (If Applicable). MONITOR YOUR BLOOD PRESSURE TWICE A DAY, LOG IN THE BOOK PROVIDED. BRING THE BOOK AND YOUR BLOOD PRESSURE MACHINE TO YOUR FOLLOW UP IN 1 MONTH     If you need a refill on your cardiac medications before your next appointment, please call your pharmacy.

## 2024-03-24 ENCOUNTER — Telehealth: Payer: Self-pay | Admitting: Licensed Clinical Social Worker

## 2024-03-24 NOTE — Telephone Encounter (Signed)
 H&V Care Navigation CSW Progress Note  Clinical Social Worker contacted patient by phone to f/u on PCP appt scheduling and assistance application. No answer this morning at [phone]. Left voicemail with details. Will re-attempt again as able.  Patient is participating in a Managed Medicaid Plan:  No, self pay only  SDOH Screenings   Food Insecurity: No Food Insecurity (03/21/2024)  Housing: Low Risk  (03/21/2024)  Transportation Needs: No Transportation Needs (03/21/2024)  Utilities: Not At Risk (03/21/2024)  Financial Resource Strain: Medium Risk (12/17/2023)  Tobacco Use: Low Risk  (03/21/2024)  Health Literacy: Inadequate Health Literacy (03/21/2024)   Mandy Serrano, MSW, LCSW Clinical Social Worker II [facility] Health Heart/Vascular Care Navigation  [phone]- work cell phone (preferred)

## 2024-03-25 ENCOUNTER — Telehealth: Payer: Self-pay | Admitting: Licensed Clinical Social Worker

## 2024-03-25 NOTE — Telephone Encounter (Signed)
 H&V Care Navigation CSW Progress Note  Clinical Social Worker contacted patient by phone to f/u on PCP appt scheduling and assistance application. No answer again this morning at [phone]. Left 2nd voicemail with details. Will re-attempt one more time as able.  Patient is participating in a Managed Medicaid Plan:  No, self pay only  SDOH Screenings   Food Insecurity: No Food Insecurity (03/21/2024)  Housing: Low Risk  (03/21/2024)  Transportation Needs: No Transportation Needs (03/21/2024)  Utilities: Not At Risk (03/21/2024)  Financial Resource Strain: Medium Risk (12/17/2023)  Tobacco Use: Low Risk  (03/21/2024)  Health Literacy: Inadequate Health Literacy (03/21/2024)     Mandy Serrano, MSW, LCSW Clinical Social Worker II [facility] Health Heart/Vascular Care Navigation  [phone]- work cell phone (preferred)

## 2024-03-27 ENCOUNTER — Telehealth (HOSPITAL_BASED_OUTPATIENT_CLINIC_OR_DEPARTMENT_OTHER): Payer: Self-pay | Admitting: Licensed Clinical Social Worker

## 2024-03-27 LAB — METANEPHRINES, PLASMA
Metanephrine, Free: <25 pg/mL (ref 0.0–88.0)
Normetanephrine, Free: 37.8 pg/mL (ref 0.0–218.9)

## 2024-03-27 LAB — T4, FREE: Free T4: 1.42 ng/dL (ref 0.82–1.77)

## 2024-03-27 LAB — BASIC METABOLIC PANEL WITH GFR
BUN/Creatinine Ratio: 16 (ref 9–23)
BUN: 27 mg/dL — ABNORMAL HIGH (ref 6–24)
CO2: 20 mmol/L (ref 20–29)
Calcium: 9.9 mg/dL (ref 8.7–10.2)
Chloride: 102 mmol/L (ref 96–106)
Creatinine, Ser: 1.64 mg/dL — ABNORMAL HIGH (ref 0.57–1.00)
Glucose: 99 mg/dL (ref 70–99)
Potassium: 4.6 mmol/L (ref 3.5–5.2)
Sodium: 138 mmol/L (ref 134–144)
eGFR: 38 mL/min/{1.73_m2} — ABNORMAL LOW (ref >59)

## 2024-03-27 LAB — T3: T3, Total: 73 ng/dL (ref 71–180)

## 2024-03-27 LAB — ALDOSTERONE + RENIN ACTIVITY W/ RATIO
Aldos/Renin Ratio: 9.5 (ref 0.0–30.0)
Aldosterone: 13.9 ng/dL (ref 0.0–30.0)
Renin Activity, Plasma: 1.465 ng/mL/h (ref 0.167–5.380)

## 2024-03-27 LAB — CATECHOLAMINES, FRACTIONATED, PLASMA
Dopamine: 20.6 pg/mL (ref 0.0–36.7)
Epinephrine: 35.8 pg/mL (ref 0.0–55.4)
Norepinephrine: 1135 pg/mL — ABNORMAL HIGH (ref 115–524)

## 2024-03-27 LAB — TSH: TSH: 5.73 u[IU]/mL — ABNORMAL HIGH (ref 0.450–4.500)

## 2024-03-27 NOTE — Telephone Encounter (Signed)
 H&V Care Navigation CSW Progress Note  Clinical Social Worker contacted patient by phone to f/u on PCP appt scheduling and assistance application. Was able to reach pt today at [phone]. Confirmed she is aware of PCP appt scheduled and encouraged her to let me know if any questions around completing [employer] and [employer] Card forms. Pt has no questions at this time, remain available for any additional questions/concerns that may arise.  Patient is participating in a Managed Medicaid Plan:  No, self pay only  SDOH Screenings   Food Insecurity: No Food Insecurity (03/21/2024)  Housing: Low Risk  (03/21/2024)  Transportation Needs: No Transportation Needs (03/21/2024)  Utilities: Not At Risk (03/21/2024)  Financial Resource Strain: Medium Risk (12/17/2023)  Tobacco Use: Low Risk  (03/21/2024)  Health Literacy: Inadequate Health Literacy (03/21/2024)    Nathen Balder, MSW, LCSW Clinical Social Worker II [facility] Health Heart/Vascular Care Navigation  [phone]- work cell phone (preferred)

## 2024-03-28 ENCOUNTER — Ambulatory Visit: Payer: Self-pay | Admitting: Cardiovascular Disease

## 2024-03-28 ENCOUNTER — Other Ambulatory Visit: Payer: Self-pay

## 2024-03-28 ENCOUNTER — Other Ambulatory Visit (HOSPITAL_COMMUNITY): Payer: Self-pay

## 2024-03-28 ENCOUNTER — Encounter (HOSPITAL_BASED_OUTPATIENT_CLINIC_OR_DEPARTMENT_OTHER): Payer: Self-pay | Admitting: *Deleted

## 2024-03-28 DIAGNOSIS — R825 Elevated urine levels of drugs, medicaments and biological substances: Secondary | ICD-10-CM

## 2024-03-28 DIAGNOSIS — I10 Essential (primary) hypertension: Secondary | ICD-10-CM

## 2024-03-31 ENCOUNTER — Ambulatory Visit (HOSPITAL_COMMUNITY): Payer: Self-pay | Admitting: Family Medicine

## 2024-03-31 ENCOUNTER — Other Ambulatory Visit (HOSPITAL_COMMUNITY): Payer: Self-pay

## 2024-03-31 ENCOUNTER — Ambulatory Visit (HOSPITAL_COMMUNITY)
Admit: 2024-03-31 | Discharge: 2024-03-31 | Disposition: A | Discharge status: Home / Self Care | Payer: MEDICAID | Source: Ambulatory Visit | Attending: Family Medicine | Admitting: Family Medicine

## 2024-03-31 ENCOUNTER — Encounter (HOSPITAL_COMMUNITY): Payer: Self-pay

## 2024-03-31 VITALS — BP 160/100 | HR 67 | Wt 226.4 lb

## 2024-03-31 DIAGNOSIS — Z8674 Personal history of sudden cardiac arrest: Secondary | ICD-10-CM | POA: Insufficient documentation

## 2024-03-31 DIAGNOSIS — Z79899 Other long term (current) drug therapy: Secondary | ICD-10-CM | POA: Insufficient documentation

## 2024-03-31 DIAGNOSIS — I110 Hypertensive heart disease with heart failure: Secondary | ICD-10-CM | POA: Insufficient documentation

## 2024-03-31 DIAGNOSIS — I5022 Chronic systolic (congestive) heart failure: Secondary | ICD-10-CM

## 2024-03-31 DIAGNOSIS — I10 Essential (primary) hypertension: Secondary | ICD-10-CM

## 2024-03-31 DIAGNOSIS — I493 Ventricular premature depolarization: Secondary | ICD-10-CM | POA: Insufficient documentation

## 2024-03-31 DIAGNOSIS — D571 Sickle-cell disease without crisis: Secondary | ICD-10-CM | POA: Insufficient documentation

## 2024-03-31 DIAGNOSIS — I469 Cardiac arrest, cause unspecified: Secondary | ICD-10-CM

## 2024-03-31 DIAGNOSIS — R519 Headache, unspecified: Secondary | ICD-10-CM | POA: Insufficient documentation

## 2024-03-31 DIAGNOSIS — R7989 Other specified abnormal findings of blood chemistry: Secondary | ICD-10-CM

## 2024-03-31 LAB — BASIC METABOLIC PANEL WITH GFR
Anion gap: 6 (ref 5–15)
BUN: 23 mg/dL — ABNORMAL HIGH (ref 6–20)
CO2: 26 mmol/L (ref 22–32)
Calcium: 8.8 mg/dL — ABNORMAL LOW (ref 8.9–10.3)
Chloride: 103 mmol/L (ref 98–111)
Creatinine, Ser: 1.53 mg/dL — ABNORMAL HIGH (ref 0.44–1.00)
GFR, Estimated: 41 mL/min — ABNORMAL LOW (ref >60)
Glucose, Bld: 103 mg/dL — ABNORMAL HIGH (ref 70–99)
Potassium: 3.6 mmol/L (ref 3.5–5.1)
Sodium: 135 mmol/L (ref 135–145)

## 2024-03-31 LAB — CBC
HCT: 36.7 % (ref 36.0–46.0)
Hemoglobin: 11.6 g/dL — ABNORMAL LOW (ref 12.0–15.0)
MCH: 29.9 pg (ref 26.0–34.0)
MCHC: 31.6 g/dL (ref 30.0–36.0)
MCV: 94.6 fL (ref 80.0–100.0)
Platelets: 306 10*3/uL (ref 150–400)
RBC: 3.88 MIL/uL (ref 3.87–5.11)
RDW: 14.5 % (ref 11.5–15.5)
WBC: 5.7 10*3/uL (ref 4.0–10.5)
nRBC: 0 % (ref 0.0–0.2)

## 2024-03-31 MED ORDER — ISOSORBIDE MONONITRATE ER 30 MG PO TB24
15.0000 mg | ORAL_TABLET | Freq: Every day | ORAL | 3 refills | Status: AC
  Filled 2024-03-31: qty 45, 90d supply, fill #0
  Filled 2024-05-06: qty 45, 90d supply, fill #1
  Filled 2024-09-02: qty 45, 90d supply, fill #2

## 2024-03-31 NOTE — Patient Instructions (Addendum)
 Thank you for coming in today  If you had labs drawn today, any labs that are abnormal the clinic will call you No news is good news  Genetic testing was done today  Medications: START Imdur  15 mg 1/2 tablet daily   Follow up appointments:  Your physician recommends that you schedule a follow-up appointment in:  3 months With Dr. Bensimhon    Do the following things EVERYDAY: Weigh yourself in the morning before breakfast. Write it down and keep it in a log. Take your medicines as prescribed Eat low salt foods--Limit salt (sodium) to 2000 mg per day.  Stay as active as you can everyday Limit all fluids for the day to less than 2 liters   At the Advanced Heart Failure Clinic, you and your health needs are our priority. As part of our continuing mission to provide you with exceptional heart care, we have created designated Provider Care Teams. These Care Teams include your primary Cardiologist (physician) and Advanced Practice Providers (APPs- Physician Assistants and Nurse Practitioners) who all work together to provide you with the care you need, when you need it.   You may see any of the following providers on your designated Care Team at your next follow up: Dr Jules Oar Dr Peder Bourdon Dr. Mimi Alt, NP Ruddy Corral, [place] [facility] [place], [place] Dennise Fitz, NP Luster Salters, PharmD   Please be sure to bring in all your medications bottles to every appointment.    Thank you for choosing [facility]-Advanced [facility] Failure [facility]  If you have any questions or concerns before your next appointment please send us  a message through [place] or call our office at [phone].    TO LEAVE A MESSAGE FOR THE NURSE SELECT OPTION 2, PLEASE LEAVE A MESSAGE INCLUDING: YOUR NAME DATE OF BIRTH CALL BACK NUMBER REASON FOR CALL**this is important as we prioritize the call backs  YOU WILL RECEIVE A CALL BACK THE SAME DAY AS LONG AS  YOU CALL BEFORE 4:00 PM

## 2024-03-31 NOTE — Progress Notes (Signed)
 [facility]  PCP: None  Primary Cardiologist: Dr. Theodis Fiscal HF Cardiologist: Dr. Julane Ny   HPI Ms Kugler is a 48 y.o. with history of obesity, HTN  was on 5 anti-HTN medicines in [place]), sickle cell anemia, systolic heart failure 05/2016, headaches, and frequent PVCs.  Admitted 8/17 with increased dyspnea and HTN crisis. She had not been taking any medications over the last last year. New acute systolic heart identified on ECHO. LVEF ~25%. CMRI no infiltrative/inflamatory process and EF ~25%. Had high PVC burden (16%) so she was started on amio. Amio eventually switch to flecainide  as EF recovered.   Unable to obtain Entresto  because she [place]  citizen.    Echo 7/19 EF 60-65%.  Bedside echo 04/2020 EF 60-65%.  Graduated from AHF clinic 05/2022, and referred to HTN clinic.  Follow up 2/25, off most meds and BP uncontrolled. Restart GDMT with plans to repeat echo to ensure EF stable.  Admitted 5/25 with OOH VF arrest, 52 minutes to ROSC. No ST elevation, HsTroponins not consistent with ACS. Initial K 6.3. Required multiple pressors, amiodarone  gtt and intubation. Fortunately, had neurological recovery, pressors weaned and extubated. Echo showed EF 55-60%, moderate LVH, normal RV. Due to elevated SCr, underwent Stress MRI (instead of cath), which showed no significant stress perfusion defects, LVEF 60%, asymmetric LVH (uncertain if HCM vs HTN). EP recommended LifeVest at discharge and watch for renal improvement before deciding upon sICD vs transvenous ICD. She was discharged home with LifeVest, weight 224 lbs.  Today she returns for post hospital HF follow up with her husband. Overall feeling fine. No issues with LifeVest. She is not SOB with activity. Denies palpitations, abnormal bleeding, CP, dizziness, edema, or PND/Orthopnea. Appetite ok. Not weighing at home. Taking all medications. BP at home 140/93. No tobacco, ETOH or drug use. No family history  of heart issues. She and husband asking about ICD and next steps.  Cardiac Studies - stress cMRI (5/25): LVEF 60%, asymmetric LVH (uncertain if HCM vs HTN). - Echo (5/25): EF 55-60%, moderate LVH, normal RV. - Zio 3/23: 6% PVC burden - Bedside echo 7/21: EF 60-65% - Echo 7/19: EF 60-65% - Echo 12/17: EF 55% - Echo 8/17: EF ~25-30%.  - cMRI 8/17: LVEF 25%, no infiltrative/inflamatory process    Review of systems complete and found to be negative unless listed in HPI.   SH:  Social History   Socioeconomic History   Marital status: Married    Spouse name: Not on file   Number of children: Not on file   Years of education: Not on file   Highest education level: Not on file  Occupational History   Not on file  Tobacco Use   Smoking status: Never    Passive exposure: Past   Smokeless tobacco: Never  Substance and Sexual Activity   Alcohol use: No   Drug use: No   Sexual activity: Not on file  Other Topics Concern   Not on file  Social History Narrative   Not on file   Social Drivers of Health   Financial Resource Strain: Medium Risk (12/17/2023)   Overall Financial Resource Strain (CARDIA)    Difficulty of Paying Living Expenses: Somewhat hard  Food Insecurity: No Food Insecurity (03/21/2024)   Hunger Vital Sign    Worried About Running Out of Food in the Last Year: Never true    Ran Out of Food in the Last Year: Never true  Transportation Needs: No Transportation Needs (03/21/2024)   PRAPARE - Administrator, Civil Service (Medical): No    Lack of Transportation (Non-Medical): No  Physical Activity: Not on file  Stress: Not on file  Social Connections: Not on file  Intimate Partner Violence: Not on file    FH:  Family History  Problem Relation Age of Onset   Hypertension Mother    Hypertension Father    Hypertension Paternal Grandmother     Past Medical History:  Diagnosis Date   Malignant hypertension     Current Outpatient Medications   Medication Sig Dispense Refill   acetaminophen  (TYLENOL ) 500 MG tablet Take 500 mg by mouth every 6 (six) hours as needed for mild pain or headache.     amiodarone  (PACERONE ) 200 MG tablet Take 2 tablets (400 mg total) by mouth 2 (two) times daily. 120 tablet 2   amLODipine  (NORVASC ) 10 MG tablet Take 1 tablet (10 mg total) by mouth daily. 30 tablet 8   ASPIR-LOW 81 MG EC tablet TAKE 1 TABLET BY MOUTH DAILY. 30 tablet 3   Blood Pressure Monitoring (BLOOD PRESSURE CUFF) MISC Please check and monitor blood pressure as advised in the clinic 1 each 0   carvedilol  (COREG ) 12.5 MG tablet Take 1 tablet (12.5 mg total) by mouth 2 (two) times daily with a meal. 60 tablet 8   hydrALAZINE  (APRESOLINE ) 100 MG tablet Take 1 tablet (100 mg total) by mouth 3 (three) times daily. 90 tablet 3   No current facility-administered medications for this encounter.   Wt Readings from Last 3 Encounters:  03/31/24 102.7 kg (226 lb 6.4 oz)  03/21/24 101.9 kg (224 lb 9.6 oz)  03/12/24 99.3 kg (218 lb 14.4 oz)   BP (!) 160/100   Pulse 67   Wt 102.7 kg (226 lb 6.4 oz)   SpO2 98%   BMI 38.86 kg/m   PHYSICAL EXAM: General:  NAD. No resp difficulty, walked into clinic HEENT: Normal Neck: Supple. No JVD. Cor: Regular rate & rhythm. No rubs, gallops or murmurs. Lungs: Clear Abdomen: Soft, nontender, nondistended.  Extremities: No cyanosis, clubbing, rash, edema Neuro: Alert & oriented x 3, moves all 4 extremities w/o difficulty. Affect pleasant.  ECG (personally reviewed): NSR 62 bpm  LifeVest interrogation (personally reviewed): no treatments, average HR 67 bpm, 3080 average daily steps.  ASSESSMENT & PLAN: 1. OOH VF arrest:  - At least 52 minutes to ROSC.  Initial rhythm was VF. - Echo 5/25: EF 55-60% with moderate LVH, normal RV, IVC normal.   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   - Ideally would have cardiac cath, but renal function  prohibitive. Stress MRI did not suggest ischemic cardiomyopathy.  - Meets criteria for secondary prevention ICD. Awaiting renal recovery to determine whether she needs subQ device vs transvenous ICD.  - Continue amio 400 mg bid - Continue LifeVest until decision mad about ICD. - She has EP follow up 04/18/24  2. Chronic HF with recovered EF:  - persistent recovery from prior nonischemic cardiomyopathy that was thought to be due to HTN +/- frequent PVCs. - Echo 5/25 with preserved EF  - LVH on echo c/w HTN.  Prior cardiac MRI with no delayed enhancement.   - Stress MRI 5/25: no significant stress perfusion defects, LVEF 60%, asymmetric LVH (uncertain if HCM vs HTN), nonspecific RV insertion LGE, RVEF 58% - Arrange genetic testing for HCM. - NYHA  I, Volume ok.  - Off spiro and losartan  with AKI. Plan to add back as able. - Start Imdur  15 mg daily. - Continue hydralazine  100 mg tid - Continue PVC suppression w/ amiodarone   - Labs today.  3. HTN:  - History of resistant HTN.  - With AKI, off spironolactone  and losartan .  - Continue hydralazine  100 mg tid  - Continue amlodipine  10 mg daily. - Continue Coreg  12.5 mg bid - Restart Imdur  as above - With recent AKI, avoid hypotension. - HTN clinic working up secondary causes of HTN; elevated norepi levels, awaiting 24-hr urine for catecholamines  4. H/o AKI:  - Suspect ATN due to cardiac arrest/shock. SCr peaked at 3.2 but showing s/o recovery.  - Most recent SCr down to 1.64 - Off losartan  and spiro until renal function settles out - Labs today.  5. PVCs:  - Long history of frequent PVCs, thought to contribute to prior cardiomyopathy.   - She was initially on amiodarone  then switched to flecainide  when EF recovered.  - Continue amio 400 mg bid, per EP   Follow up in 3-4 months with Dr. Lynnwood Sauer, FNP-BC 03/31/24

## 2024-04-07 ENCOUNTER — Other Ambulatory Visit (HOSPITAL_COMMUNITY): Payer: Self-pay

## 2024-04-09 ENCOUNTER — Other Ambulatory Visit (HOSPITAL_COMMUNITY): Payer: Self-pay

## 2024-04-14 ENCOUNTER — Telehealth: Payer: Self-pay | Admitting: Licensed Clinical Social Worker

## 2024-04-14 NOTE — Telephone Encounter (Signed)
 H&V Care Navigation CSW Progress Note  Clinical Social Worker contacted patient by phone to f/u on assistance applications, no answer today and no voicemail at [phone], will attempt to see pt on Friday at clinic to f/u and provide new apps if needed.  Patient is participating in a Managed Medicaid Plan:  No, self pay only  SDOH Screenings   Food Insecurity: No Food Insecurity (03/21/2024)  Housing: Low Risk  (03/21/2024)  Transportation Needs: No Transportation Needs (03/21/2024)  Utilities: Not At Risk (03/21/2024)  Financial Resource Strain: Medium Risk (12/17/2023)  Tobacco Use: Low Risk  (03/31/2024)  Health Literacy: Inadequate Health Literacy (03/21/2024)    [name], MSW, LCSW Clinical Social Worker II [facility] Health Heart/Vascular Care Navigation  [phone]- work cell phone (preferred)

## 2024-04-18 ENCOUNTER — Ambulatory Visit: Payer: MEDICAID | Attending: Student | Admitting: Student

## 2024-04-18 ENCOUNTER — Encounter: Payer: Self-pay | Admitting: Student

## 2024-04-18 ENCOUNTER — Telehealth: Payer: Self-pay | Admitting: Licensed Clinical Social Worker

## 2024-04-18 VITALS — BP 136/84 | HR 66 | Ht 67.0 in | Wt 229.0 lb

## 2024-04-18 DIAGNOSIS — I5022 Chronic systolic (congestive) heart failure: Secondary | ICD-10-CM

## 2024-04-18 DIAGNOSIS — I469 Cardiac arrest, cause unspecified: Secondary | ICD-10-CM

## 2024-04-18 DIAGNOSIS — Z79899 Other long term (current) drug therapy: Secondary | ICD-10-CM

## 2024-04-18 LAB — CBC

## 2024-04-18 MED ORDER — AMIODARONE HCL 200 MG PO TABS: 200.0000 mg | ORAL_TABLET | Freq: Two times a day (BID) | ORAL | Status: DC

## 2024-04-18 NOTE — Progress Notes (Signed)
  Electrophysiology Office Note:   Date:  04/18/2024  ID:  Mandy Serrano, DOB 1975/11/21, MRN [id number]  Primary Cardiologist: None Electrophysiologist: Will Gladis Norton, MD      History of Present Illness:   Mandy Serrano is a 48 y.o. female with h/o obesity, HTN, sickle cell, h/o systolic CHF with recovered EF, HAs, frequent PVCs, and aborted cardiac arrest seen today for post hospital follow up.    Admitted 5/25 with OOH VF arrest, 52 minutes to ROSC. No ST elevation, HsTroponins not consistent with ACS. Initial K 6.3. Required multiple pressors, amiodarone  gtt and intubation. Fortunately, had neurological recovery, pressors weaned and extubated. Echo showed EF 55-60%, moderate LVH, normal RV. Due to elevated SCr, underwent Stress MRI (instead of cath), which showed no significant stress perfusion defects, LVEF 60%, asymmetric LVH (uncertain if HCM vs HTN). EP recommended LifeVest at discharge and watch for renal improvement before deciding upon sICD vs transvenous ICD. She was discharged home with LifeVest, weight 224 lbs.   Since discharge from hospital the patient reports doing OK. Subsequent labwork has shown improvement of her kidney function. She is willing to consider transvenous ICD but is very nervous. Currently, denies chest pain, palpitations, dyspnea, or edema.   Review of systems complete and found to be negative unless listed in HPI.   EP Information / Studies Reviewed:    EKG is not ordered today. EKG from 03/31/2024 reviewed which showed NSR in the 60s       Arrhythmia/Device History HF- Fund, CSW aware to f/u 11/12/17  HF MD--Dr Bensimhon- GRADUATED FROM CHF CLINIC 08/09/22   Physical Exam:   VS:  BP 136/84   Pulse 66   Ht 5' 7 (1.702 m)   Wt 229 lb (103.9 kg)   SpO2 98%   BMI 35.87 kg/m    Wt Readings from Last 3 Encounters:  04/18/24 229 lb (103.9 kg)  03/31/24 226 lb 6.4 oz (102.7 kg)  03/21/24 224 lb 9.6 oz (101.9 kg)     GEN: No acute distress NECK:  No JVD; No carotid bruits CARDIAC: Regular rate and rhythm, no murmurs, rubs, gallops RESPIRATORY:  Clear to auscultation without rales, wheezing or rhonchi  ABDOMEN: Soft, non-tender, non-distended EXTREMITIES:  No edema; No deformity   ASSESSMENT AND PLAN:    Aborted cardiac arrest Uncertain etiology Negative stress MRI EF preserved on post arrest echo and MRI Lifevest in place Explained risks, benefits, and alternatives to ICD implantation, including but not limited to bleeding, infection, pneumothorax, pericardial effusion, lead dislodgement, heart attack, stroke, or death.  Pt verbalized understanding and agrees to proceed.      Timing will depend on her Medicaid, which is currently pending. Will discuss with Social work to assist in planning.   PVCs Decrease amiodarone  to 200 mg BID Surveillance labs with pre op labs.   HTN Stable on current regimen   Chronic HF with recovered EF EF remained preserved despite arrest. GDMT per HF team   Follow up with EP Team as usual post procedure; Timing pending insurance planning.   Signed, Ozell Prentice Passey, PA-C

## 2024-04-18 NOTE — Telephone Encounter (Addendum)
 H&V Care Navigation CSW Progress Note  Clinical Social Worker contacted patient by phone to f/u after appt regarding assistance options for upcoming procedure. I have previously spoken with pt about [facility] Financial Assistance, pt is not eligible for full Medicaid benefits at this time. A previous Emergency Medicaid application for her recent hospital stay is pending but will not cover this upcoming procedure. Available to assist pt with completing application previously provided or provide new one if needed. No answer at either her or her husbands phones today (DPR on file for spouse), I left voicemail requesting return call. Spoke with April, CMA regarding attempts to reach pt.   Patient is participating in a Managed Medicaid Plan:    SDOH Screenings   Food Insecurity: No Food Insecurity (03/21/2024)  Housing: Low Risk  (03/21/2024)  Transportation Needs: No Transportation Needs (03/21/2024)  Utilities: Not At Risk (03/21/2024)  Financial Resource Strain: Medium Risk (12/17/2023)  Tobacco Use: Low Risk  (03/31/2024)  Health Literacy: Inadequate Health Literacy (03/21/2024)     Mandy Serrano, MSW, LCSW Clinical Social Worker II [facility] Health Heart/Vascular Care Navigation  [phone]- work cell phone (preferred)

## 2024-04-18 NOTE — Patient Instructions (Addendum)
 Medication Instructions:  Decrease amiodarone  to 200 mg twice daily *If you need a refill on your cardiac medications before your next appointment, please call your pharmacy*  Lab Work: BMET, CBC-TODAY If you have labs (blood work) drawn today and your tests are completely normal, you will receive your results only by: MyChart Message (if you have MyChart) OR A paper copy in the mail If you have any lab test that is abnormal or we need to change your treatment, we will call you to review the results.  Testing/Procedures: See letter  Follow-Up: At [facility], you and your health needs are our priority.  As part of our continuing mission to provide you with exceptional heart care, our providers are all part of one team.  This team includes your primary Cardiologist (physician) and Advanced Practice Providers or APPs (Physician Assistants and Nurse Practitioners) who all work together to provide you with the care you need, when you need it.  Your next appointment:   Follow up will be arranged for you and print out on your discharge summary after your procedure.

## 2024-04-19 ENCOUNTER — Ambulatory Visit: Payer: Self-pay | Admitting: Student

## 2024-04-19 LAB — BASIC METABOLIC PANEL WITH GFR
BUN/Creatinine Ratio: 16 (ref 9–23)
BUN: 19 mg/dL (ref 6–24)
CO2: 21 mmol/L (ref 20–29)
Calcium: 9.5 mg/dL (ref 8.7–10.2)
Chloride: 101 mmol/L (ref 96–106)
Creatinine, Ser: 1.18 mg/dL — ABNORMAL HIGH (ref 0.57–1.00)
Glucose: 90 mg/dL (ref 70–99)
Potassium: 4.7 mmol/L (ref 3.5–5.2)
Sodium: 139 mmol/L (ref 134–144)
eGFR: 57 mL/min/1.73 — ABNORMAL LOW

## 2024-04-19 LAB — CBC
Hematocrit: 37.1 % (ref 34.0–46.6)
Hemoglobin: 12 g/dL (ref 11.1–15.9)
MCH: 31.3 pg (ref 26.6–33.0)
MCHC: 32.3 g/dL (ref 31.5–35.7)
MCV: 97 fL (ref 79–97)
Platelets: 357 x10E3/uL (ref 150–450)
RBC: 3.84 x10E6/uL (ref 3.77–5.28)
RDW: 13.7 % (ref 11.7–15.4)
WBC: 7.1 x10E3/uL (ref 3.4–10.8)

## 2024-04-21 ENCOUNTER — Telehealth (HOSPITAL_BASED_OUTPATIENT_CLINIC_OR_DEPARTMENT_OTHER): Payer: Self-pay | Admitting: Licensed Clinical Social Worker

## 2024-04-21 NOTE — Telephone Encounter (Signed)
 H&V Care Navigation CSW Progress Note  Clinical Social Worker received a call back from pt husband Charlie to return my call from last week (DPR on file, [phone]). Re-introduced self, role, reason for call. Pt husband confirms they still have pt application for CAFA at home. Explained importance of completing that for ongoing medical costs. Pt has upcoming procedure, explained may be able to apply for emergency Medicaid but that would not cover ongoing outpatient visit costs. Pt husband states understanding. Pt husband then apologized for repetition stating his English isnt great. I offered interpretation services and pt husband shares they speak [place]. LCSW will add [place] interpreter on line when they return my call once they have returned home.  Patient is participating in a Managed Medicaid Plan:  No, self pay only  SDOH Screenings   Food Insecurity: No Food Insecurity (03/21/2024)  Housing: Low Risk  (03/21/2024)  Transportation Needs: No Transportation Needs (03/21/2024)  Utilities: Not At Risk (03/21/2024)  Financial Resource Strain: Medium Risk (12/17/2023)  Tobacco Use: Low Risk  (04/18/2024)  Health Literacy: Inadequate Health Literacy (03/21/2024)    [name], MSW, LCSW Clinical Social Worker II [facility] Health Heart/Vascular Care Navigation  [phone]- work cell phone (preferred)

## 2024-04-22 ENCOUNTER — Telehealth: Payer: Self-pay | Admitting: Cardiology

## 2024-04-22 NOTE — Telephone Encounter (Signed)
 Patient called wanting to cancel her procedure on 7/7.

## 2024-04-22 NOTE — Telephone Encounter (Signed)
 Pt does not want to proceed with ICD implant, no plans to reschedule at this time.  Aware I will forward to Dr. Cherrie & Dr. Inocencio for their FYI and any advisement they may have.

## 2024-04-22 NOTE — Telephone Encounter (Signed)
 Contacted pt and she reports that she wants to cancel her ICD placement. Pt reports that she is not ready yet.

## 2024-04-23 ENCOUNTER — Other Ambulatory Visit (HOSPITAL_COMMUNITY): Payer: Self-pay

## 2024-04-23 NOTE — Telephone Encounter (Signed)
 Spoke with patient's husband Richard. Informed and explained the recommendations from Dr. Inocencio and Dr. Bensimhon.   He reports patient is very nervous and wishes to cancel ICD placement on July 7 because she is not ready to proceed; however, he is agreeable for her to see someone prior to rescheduling a new procedure date. Scheduling has made an appt for July 31 with Camnitz.

## 2024-04-28 ENCOUNTER — Ambulatory Visit: Admission: RE | Admit: 2024-04-28 | Payer: Self-pay | Source: Home / Self Care | Admitting: Cardiology

## 2024-04-28 ENCOUNTER — Encounter: Admission: RE | Payer: Self-pay | Source: Home / Self Care

## 2024-04-28 DIAGNOSIS — I5022 Chronic systolic (congestive) heart failure: Secondary | ICD-10-CM

## 2024-04-28 SURGERY — ICD IMPLANT
Anesthesia: Moderate Sedation

## 2024-04-28 NOTE — Telephone Encounter (Signed)
 H&V Care Navigation CSW Progress Note  Clinical Social Worker contacted caregiver by phone to f/u on assistance applications. This time I left a message with a [place] interpreter Tanda, [phone] at [phone]. Will re-attempt again as able. Also note pt cancelled procedure- not sure if possibly it would be helpful to have intepreter present during f/u appt- will ask pt/pt spouse.  Patient is participating in a Managed Medicaid Plan:  No, self pay only  SDOH Screenings   Food Insecurity: No Food Insecurity (03/21/2024)  Housing: Low Risk  (03/21/2024)  Transportation Needs: No Transportation Needs (03/21/2024)  Utilities: Not At Risk (03/21/2024)  Financial Resource Strain: Medium Risk (12/17/2023)  Tobacco Use: Low Risk  (04/18/2024)  Health Literacy: Inadequate Health Literacy (03/21/2024)    [name], MSW, LCSW Clinical Social Worker II [facility] Health Heart/Vascular Care Navigation  [phone]- work cell phone (preferred)

## 2024-05-06 ENCOUNTER — Other Ambulatory Visit (HOSPITAL_COMMUNITY): Payer: Self-pay

## 2024-05-07 ENCOUNTER — Other Ambulatory Visit (HOSPITAL_COMMUNITY): Payer: Self-pay

## 2024-05-13 ENCOUNTER — Ambulatory Visit: Payer: Self-pay

## 2024-05-13 ENCOUNTER — Telehealth (HOSPITAL_BASED_OUTPATIENT_CLINIC_OR_DEPARTMENT_OTHER): Payer: Self-pay | Admitting: Licensed Clinical Social Worker

## 2024-05-13 NOTE — Telephone Encounter (Signed)
 H&V Care Navigation CSW Progress Note  Clinical Social Worker contacted caregiver by phone to f/u on assistance applications. This time I was unable to leave a message with a [place] interpreter Beno, [phone] at [phone]. Will re-attempt again as able. Mailed a new copy of Haematologist.  Patient is participating in a Managed Medicaid Plan:  No, self pay only  SDOH Screenings   Food Insecurity: No Food Insecurity (03/21/2024)  Housing: Low Risk  (03/21/2024)  Transportation Needs: No Transportation Needs (03/21/2024)  Utilities: Not At Risk (03/21/2024)  Financial Resource Strain: Medium Risk (12/17/2023)  Tobacco Use: Low Risk  (04/18/2024)  Health Literacy: Inadequate Health Literacy (03/21/2024)    [name], MSW, LCSW Clinical Social Worker II [facility] Health Heart/Vascular Care Navigation  [phone]- work cell phone (preferred)

## 2024-05-19 ENCOUNTER — Other Ambulatory Visit (HOSPITAL_COMMUNITY): Payer: Self-pay

## 2024-05-22 ENCOUNTER — Ambulatory Visit: Payer: Self-pay | Attending: Cardiology | Admitting: Cardiology

## 2024-05-22 ENCOUNTER — Other Ambulatory Visit (HOSPITAL_COMMUNITY): Payer: Self-pay

## 2024-05-22 ENCOUNTER — Encounter: Payer: Self-pay | Admitting: Cardiology

## 2024-05-22 ENCOUNTER — Other Ambulatory Visit: Payer: Self-pay

## 2024-05-22 VITALS — BP 142/88 | HR 76 | Ht 67.0 in | Wt 227.0 lb

## 2024-05-22 DIAGNOSIS — I10 Essential (primary) hypertension: Secondary | ICD-10-CM

## 2024-05-22 DIAGNOSIS — I5022 Chronic systolic (congestive) heart failure: Secondary | ICD-10-CM

## 2024-05-22 DIAGNOSIS — I469 Cardiac arrest, cause unspecified: Secondary | ICD-10-CM

## 2024-05-22 DIAGNOSIS — I493 Ventricular premature depolarization: Secondary | ICD-10-CM

## 2024-05-22 MED ORDER — AMIODARONE HCL 200 MG PO TABS
200.0000 mg | ORAL_TABLET | Freq: Every day | ORAL | 2 refills | Status: AC
  Filled 2024-05-22: qty 30, 30d supply, fill #0
  Filled 2024-06-16: qty 30, 30d supply, fill #1
  Filled 2024-07-04: qty 30, 30d supply, fill #2
  Filled 2024-08-13: qty 30, 30d supply, fill #3
  Filled 2024-09-02: qty 30, 30d supply, fill #4
  Filled 2024-10-16: qty 30, 30d supply, fill #5
  Filled 2024-11-19: qty 30, 30d supply, fill #6

## 2024-05-22 NOTE — Progress Notes (Signed)
  Electrophysiology Office Note:   Date:  05/22/2024  ID:  Mandy Serrano, DOB 07-28-1976, MRN [id number]  Primary Cardiologist: None Primary Heart Failure: None Electrophysiologist: Chivon Lepage Gladis Norton, MD      History of Present Illness:   Mandy Serrano is a 48 y.o. female with h/o obesity, hypertension, sickle cell, chronic systolic heart failure with recovered ejection fraction, PVCs, aborted cardiac arrest seen today for routine electrophysiology followup.   She was admitted to the hospital May 2025 with out-of-hospital VF arrest.  She was down for 52 minutes.  She had no ST changes.  Had neurologic recovery.  Due to elevated creatinine, had stress MRI which showed no significant perfusion defects.  Ejection fraction is 60%.  She was discharged with a LifeVest to see if she would have improvement in kidney function.  Since last being seen in our clinic the patient reports doing overall well.  She has no chest pain or shortness of breath.  She has not had any further arrhythmias.  She is currently wearing a LifeVest..  she denies chest pain, palpitations, dyspnea, PND, orthopnea, nausea, vomiting, dizziness, syncope, edema, weight gain, or early satiety.   Review of systems complete and found to be negative unless listed in HPI.   EP Information / Studies Reviewed:    EKG is not ordered today. EKG from 03/31/2024 reviewed which showed sinus rhythm        Risk Assessment/Calculations:            Physical Exam:   VS:  There were no vitals taken for this visit.   Wt Readings from Last 3 Encounters:  04/18/24 229 lb (103.9 kg)  03/31/24 226 lb 6.4 oz (102.7 kg)  03/21/24 224 lb 9.6 oz (101.9 kg)     GEN: Well nourished, well developed in no acute distress NECK: No JVD; No carotid bruits CARDIAC: Regular rate and rhythm, no murmurs, rubs, gallops RESPIRATORY:  Clear to auscultation without rales, wheezing or rhonchi  ABDOMEN: Soft, non-tender, non-distended EXTREMITIES:  No  edema; No deformity   ASSESSMENT AND PLAN:    1.  Aborted cardiac arrest: Had ventricular fibrillation.  Was discharged from the hospital with a LifeVest.  She would benefit from ICD therapy.  Unfortunately, she is currently uninsured.  She is concerned about the cost of an ICD implant.  We did discuss risks and benefits of the procedure.  She Tyreona Panjwani work to find out how much her out-of-pocket cost would be to see if she can afford this.  If she cannot forward an ICD, she may be on amiodarone  until she gets insurance.  2.  PVCs: On amiodarone .  3.  Hypertension: Mildly elevated but usually well-controlled  4.  Chronic systolic heart failure: Ejection fraction has normalized.  Medical therapy per heart failure team.  Follow up with Dr. Norton pending decision on ICD   Signed, Marquies Wanat Gladis Norton, MD

## 2024-05-29 ENCOUNTER — Encounter (HOSPITAL_BASED_OUTPATIENT_CLINIC_OR_DEPARTMENT_OTHER): Payer: Self-pay | Admitting: Family

## 2024-06-06 ENCOUNTER — Other Ambulatory Visit (HOSPITAL_COMMUNITY): Payer: Self-pay

## 2024-06-09 ENCOUNTER — Other Ambulatory Visit (HOSPITAL_COMMUNITY): Payer: Self-pay

## 2024-06-10 ENCOUNTER — Encounter: Payer: Self-pay | Admitting: Family

## 2024-06-10 ENCOUNTER — Ambulatory Visit (INDEPENDENT_AMBULATORY_CARE_PROVIDER_SITE_OTHER): Payer: Self-pay | Admitting: Family

## 2024-06-10 VITALS — BP 180/101 | HR 66 | Temp 97.9°F | Resp 16 | Ht 67.0 in | Wt 227.2 lb

## 2024-06-10 DIAGNOSIS — H5711 Ocular pain, right eye: Secondary | ICD-10-CM

## 2024-06-10 DIAGNOSIS — Z0100 Encounter for examination of eyes and vision without abnormal findings: Secondary | ICD-10-CM

## 2024-06-10 DIAGNOSIS — I493 Ventricular premature depolarization: Secondary | ICD-10-CM

## 2024-06-10 DIAGNOSIS — I110 Hypertensive heart disease with heart failure: Secondary | ICD-10-CM

## 2024-06-10 DIAGNOSIS — I5022 Chronic systolic (congestive) heart failure: Secondary | ICD-10-CM

## 2024-06-10 DIAGNOSIS — I10 Essential (primary) hypertension: Secondary | ICD-10-CM

## 2024-06-10 DIAGNOSIS — Z7689 Persons encountering health services in other specified circumstances: Secondary | ICD-10-CM

## 2024-06-10 DIAGNOSIS — Z599 Problem related to housing and economic circumstances, unspecified: Secondary | ICD-10-CM

## 2024-06-10 NOTE — Progress Notes (Signed)
 Subjective:    Mandy Serrano - 48 y.o. female MRN [id number]  Date of birth: 27-Jan-1976  HPI  Mandy Serrano is to establish care.   Current issues and/or concerns: - Established with Cardiology for chronic conditions management. States she has an upcoming appointment with Cardiology and plans to call and see if she can get a sooner appointment. She does not complain of red flag symptoms such as but not limited to chest pain, shortness of breath, worst headache of life, nausea/vomiting.  - Routine eye exam. States right eye pain. Denies red flag symptoms. States thinks she needs new eyeglasses prescription. - States she needs to renew orange card.   ROS per HPI    Health Maintenance:  Health Maintenance Due  Topic Date Due   Hepatitis C Screening  Never done   DTaP/Tdap/Td (1 - Tdap) Never done   Pneumococcal Vaccine: 50+ Years (1 of 2 - PCV) Never done   Hepatitis B Vaccines 19-59 Average Risk (1 of 3 - 19+ 3-dose series) Never done   Zoster Vaccines- Shingrix (1 of 2) Never done   Cervical Cancer Screening (HPV/Pap Cotest)  Never done   Colonoscopy  Never done   COVID-19 Vaccine (4 - 2024-25 season) 06/24/2023   MAMMOGRAM  Never done   INFLUENZA VACCINE  05/23/2024     Past Medical History: Patient Active Problem List   Diagnosis Date Noted   Cardiac arrest (HCC) 03/02/2024   Shock (HCC) 03/02/2024   Acute respiratory failure with hypoxia (HCC) 03/02/2024   AKI (acute kidney injury) (HCC) 03/02/2024   Frequent PVCs    Acute pulmonary edema (HCC) 06/14/2016   Acute systolic CHF (congestive heart failure) (HCC) 06/14/2016   Malignant hypertension 06/12/2016      Social History   reports that she has never smoked. She has been exposed to tobacco smoke. She has never used smokeless tobacco. She reports that she does not drink alcohol and does not use drugs.   Family History  family history includes Hypertension in her father, mother, and paternal grandmother.    Medications: reviewed and updated   Objective:   Physical Exam BP (!) 180/101   Pulse 66   Temp 97.9 F (36.6 C) (Oral)   Resp 16   Ht 5' 7 (1.702 m)   Wt 227 lb 3.2 oz (103.1 kg)   LMP 06/07/2024 (Exact Date)   SpO2 97%   BMI 35.58 kg/m      06/10/2024    3:35 PM 06/10/2024    3:06 PM 05/22/2024    3:00 PM  Vitals with BMI  Height  5' 7 5' 7  Weight  227 lbs 3 oz 227 lbs  BMI  35.58 35.55  Systolic 180 180 857  Diastolic 101 116 88  Pulse  66 76   Physical Exam HENT:     Head: Normocephalic and atraumatic.     Nose: Nose normal.     Mouth/Throat:     Mouth: Mucous membranes are moist.     Pharynx: Oropharynx is clear.  Eyes:     General: Lids are normal. Vision grossly intact. Gaze aligned appropriately.     Extraocular Movements: Extraocular movements intact.     Conjunctiva/sclera: Conjunctivae normal.     Pupils: Pupils are equal, round, and reactive to light.  Cardiovascular:     Rate and Rhythm: Normal rate and regular rhythm.     Pulses: Normal pulses.     Heart sounds: Normal heart sounds.  Pulmonary:  Effort: Pulmonary effort is normal.     Breath sounds: Normal breath sounds.  Musculoskeletal:        General: Normal range of motion.     Cervical back: Normal range of motion and neck supple.  Neurological:     General: No focal deficit present.     Mental Status: She is alert and oriented to person, place, and time.  Psychiatric:        Mood and Affect: Mood normal.        Behavior: Behavior normal.    Assessment & Plan:  1. Encounter to establish care (Primary) - Patient presents today to establish care. During the interim follow-up with primary provider as scheduled.  - Return for annual physical examination, labs, and health maintenance. Arrive fasting meaning having no food for at least 8 hours prior to appointment. You may have only water or black coffee. Please take scheduled medications as normal.  2. PVCs (premature ventricular  contractions) 3. Primary hypertension 4. Chronic systolic heart failure (HCC) - Blood pressure not at goal during today's visit. Patient asymptomatic without chest pressure, chest pain, palpitations, shortness of breath, worst headache of life, and any additional red flag symptoms. - Keep all scheduled appointments with established Cardiology.  5. Routine eye exam 6. Eye pain, right - Referral to Ophthalmology for evaluation/management. - Ambulatory referral to Ophthalmology  7. Financial difficulties - Offered patient [place] financial discount/orange card and blue card application.      Patient was given clear instructions to go to Emergency Department or return to medical center if symptoms don't improve, worsen, or new problems develop.The patient verbalized understanding.  I discussed the assessment and treatment plan with the patient. The patient was provided an opportunity to ask questions and all were answered. The patient agreed with the plan and demonstrated an understanding of the instructions.   The patient was advised to call back or seek an in-person evaluation if the symptoms worsen or if the condition fails to improve as anticipated.    Greig Drones, NP 06/10/2024, 3:39 PM Primary Care at [facility]

## 2024-06-10 NOTE — Progress Notes (Signed)
 Referral to cardiology

## 2024-06-16 ENCOUNTER — Other Ambulatory Visit (HOSPITAL_COMMUNITY): Payer: Self-pay

## 2024-06-26 ENCOUNTER — Other Ambulatory Visit (HOSPITAL_COMMUNITY): Payer: Self-pay

## 2024-07-02 ENCOUNTER — Other Ambulatory Visit (HOSPITAL_COMMUNITY): Payer: Self-pay | Admitting: Internal Medicine

## 2024-07-04 ENCOUNTER — Other Ambulatory Visit (HOSPITAL_COMMUNITY): Payer: Self-pay

## 2024-07-06 NOTE — Progress Notes (Signed)
 [facility]  PCP: None  Primary Cardiologist: Dr. Raford HF Cardiologist: Dr. Cherrie   Chief complaint: Cardiac arrest  HPI [name] is a 48 y.o. with history of obesity, HTN  was on 5 anti-HTN medicines in [place]), sickle cell anemia, systolic heart failure 05/2016 and frequent PVCs.  Admitted 8/17 with increased dyspnea and HTN crisis. She had not been taking any medications over the last last year. New acute systolic heart identified on ECHO. LVEF ~25%. CMRI no infiltrative/inflamatory process and EF ~25%. Had high PVC burden (16%) so she was started on amio. Amio eventually switch to flecainide  as EF recovered.   Unable to obtain Entresto  because she [place]  citizen.    Echo 7/19 EF 60-65%.  Bedside echo 04/2020 EF 60-65%.  Graduated from AHF clinic 05/2022, and referred to HTN clinic.  Admitted 5/25 with OOH VF arrest, 52 minutes to ROSC. No ST elevation, HsTroponins not consistent with ACS. Initial K 6.3. Required multiple pressors, amiodarone  gtt and intubation. Fortunately, had neurological recovery, pressors weaned and extubated. Echo showed EF 55-60%, moderate LVH, normal RV. Due to elevated SCr, underwent Stress MRI (instead of cath), which showed no significant stress perfusion defects, LVEF 60%, asymmetric LVH (uncertain if HCM vs HTN). EP recommended LifeVest at discharge and watch for renal improvement before deciding upon sICD vs transvenous ICD. She was discharged home with LifeVest, weight 224 lbs.  Seen by Dr. Inocencio in 7/25. She was uninsured so unable to afford ICD. Amio continued  Here with her husband. Still wearing Lifevest. Now on inactive status. Getting Lifevest alarms due to poor positioning. No therapies. No palpitations or CP. 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 Says she measures BP at home 130/90s at home. Reports compliance with meds. Hasn't applied for Medicaid yet.   Cardiac Studies - stress cMRI (5/25): LVEF  60%, asymmetric LVH (uncertain if HCM vs HTN). - Echo (5/25): EF 55-60%, moderate LVH, normal RV. - Zio 3/23: 6% PVC burden - Bedside echo 7/21: EF 60-65% - Echo 7/19: EF 60-65% - Echo 12/17: EF 55% - Echo 8/17: EF ~25-30%.  - cMRI 8/17: LVEF 25%, no infiltrative/inflamatory process    Review of systems complete and found to be negative unless listed in HPI.   SH:  Social History   Socioeconomic History   Marital status: Married    Spouse name: Not on file   Number of children: Not on file   Years of education: Not on file   Highest education level: Not on file  Occupational History   Not on file  Tobacco Use   Smoking status: Never    Passive exposure: Past   Smokeless tobacco: Never  Vaping Use   Vaping status: Never Used  Substance and Sexual Activity   Alcohol use: No   Drug use: No   Sexual activity: Not Currently  Other Topics Concern   Not on file  Social History Narrative   Not on file   Social Drivers of Health   Financial Resource Strain: Medium Risk (12/17/2023)   Overall Financial Resource Strain (CARDIA)    Difficulty of Paying Living Expenses: Somewhat hard  Food Insecurity: No Food Insecurity (03/21/2024)   Hunger Vital Sign    Worried About Running Out of Food in the Last Year: Never true    Ran Out of Food in the Last Year: Never true  Transportation Needs: No Transportation Needs (03/21/2024)   PRAPARE - Transportation  Lack of Transportation (Medical): No    Lack of Transportation (Non-Medical): No  Physical Activity: Insufficiently Active (06/10/2024)   Exercise Vital Sign    Days of Exercise per Week: 1 day    Minutes of Exercise per Session: 30 min  Stress: No Stress Concern Present (06/10/2024)   [employer] of Occupational Health - Occupational Stress Questionnaire    Feeling of Stress: Not at all  Social Connections: Moderately Integrated (06/10/2024)   Social Connection and Isolation Panel    Frequency of Communication with  Friends and Family: More than three times a week    Frequency of Social Gatherings with Friends and Family: More than three times a week    Attends Religious Services: More than 4 times per year    Active Member of [employer] or Organizations: No    Attends Banker Meetings: Never    Marital Status: Married  Catering manager Violence: Not At Risk (06/10/2024)   Humiliation, Afraid, Rape, and Kick questionnaire    Fear of Current or Ex-Partner: No    Emotionally Abused: No    Physically Abused: No    Sexually Abused: No    FH:  Family History  Problem Relation Age of Onset   Hypertension Mother    Hypertension Father    Hypertension Paternal Grandmother     Past Medical History:  Diagnosis Date   Malignant hypertension     Current Outpatient Medications  Medication Sig Dispense Refill   acetaminophen  (TYLENOL ) 500 MG tablet Take 500 mg by mouth every 6 (six) hours as needed for mild pain or headache.     amiodarone  (PACERONE ) 200 MG tablet Take 1 tablet (200 mg total) by mouth daily. 90 tablet 2   amLODipine  (NORVASC ) 10 MG tablet Take 1 tablet (10 mg total) by mouth daily. 30 tablet 8   ASPIR-LOW 81 MG EC tablet TAKE 1 TABLET BY MOUTH DAILY. 30 tablet 3   Blood Pressure Monitoring (BLOOD PRESSURE CUFF) MISC Please check and monitor blood pressure as advised in the clinic 1 each 0   carvedilol  (COREG ) 12.5 MG tablet Take 1 tablet (12.5 mg total) by mouth 2 (two) times daily with a meal. 60 tablet 8   hydrALAZINE  (APRESOLINE ) 100 MG tablet Take 1 tablet (100 mg total) by mouth 3 (three) times daily. 90 tablet 3   isosorbide  mononitrate (IMDUR ) 30 MG 24 hr tablet Take 0.5 tablets (15 mg total) by mouth daily. 45 tablet 3   No current facility-administered medications for this encounter.   Wt Readings from Last 3 Encounters:  06/10/24 103.1 kg (227 lb 3.2 oz)  05/22/24 103 kg (227 lb)  04/18/24 103.9 kg (229 lb)   Vitals:   07/07/24 0907  BP: (!) 164/90  Pulse: 70   SpO2: 99%     PHYSICAL EXAM: General:  Well appearing. No resp difficulty HEENT: normal Neck: supple. no JVD. Carotids 2+ bilat; no bruits. No lymphadenopathy or thryomegaly appreciated. Cor: PMI nondisplaced. Regular rate & rhythm. No rubs, gallops or murmurs. Lungs: clear Abdomen: obese soft, nontender, nondistended. No hepatosplenomegaly. No bruits or masses. Good bowel sounds. Extremities: no cyanosis, clubbing, rash, edema Neuro: alert & orientedx3, cranial nerves grossly intact. moves all 4 extremities w/o difficulty. Affect pleasant  ASSESSMENT & PLAN:  1. OOH VF arrest:  - In 5/25. At least 52 minutes to ROSC.  - Echo 5/25: EF 55-60% with moderate LVH, normal RV, IVC normal.   - She had been on flecainide  for chronic PVCs  which were thought to contribute to prior cardiomyopathy.   - Ideally would have cardiac cath, but renal function prohibitive. Stress MRI did not suggest ischemic cardiomyopathy.  - Meets criteria for secondary prevention ICD.  - Seen by Dr. Inocencio in 7/25. She was uninsured so unable to afford ICD. Amio continued - Continue amio 200 daily - Unable to pay for LifeVest and dose not have insurance (has not applied for Medicaid as instructed). Will have her meet with SW to facilitate  2. Chronic HF with recovered EF:  - persistent recovery from prior nonischemic cardiomyopathy that was thought to be due to HTN +/- frequent PVCs. - Echo 5/25 with preserved EF  - LVH on echo c/w HTN.  Prior cardiac MRI with no delayed enhancement.   - Stress MRI 5/25: no significant stress perfusion defects, LVEF 60%, asymmetric LVH (uncertain if HCM vs HTN), nonspecific RV insertion LGE, RVEF 58% - Arrange genetic testing for HCM. - NYHA I, Volume ok.  - Continue carvedilol  12.5 bid - Off spiro and losartan  with AKI.  - Continue hydralazine  100 mg tid and Imdur  15 daily - Start losartan . - check labs  3. HTN:  - History of resistant HTN.  - With AKI, off  spironolactone  and losartan .  - Continue hydralazine  100 mg tid  - Continue amlodipine  10 mg daily. - Continue Coreg  12.5 mg bid - Start losartan  50 - Has been followed by HTN CLinic - will refer back  4. H/o AKI:  - Suspect ATN due to cardiac arrest/shock. SCr peaked at 3.2 but showing s/o recovery.  - Most recent SCr down to 1.18 (? Accurate) - Repeat labs  - Start losartan  50   5. PVCs:  - Long history of frequent PVCs, thought to contribute to prior cardiomyopathy.   - She was initially on amiodarone  then switched to flecainide  when EF recovered.  - Continue amio per EP   Toribio Fuel, MD  9:49 PM

## 2024-07-07 ENCOUNTER — Encounter (HOSPITAL_COMMUNITY): Payer: Self-pay | Admitting: Internal Medicine

## 2024-07-07 ENCOUNTER — Ambulatory Visit (HOSPITAL_COMMUNITY)
Admission: RE | Admit: 2024-07-07 | Discharge: 2024-07-07 | Disposition: A | Discharge status: Home / Self Care | Payer: MEDICAID | Source: Ambulatory Visit | Attending: Internal Medicine | Admitting: Internal Medicine

## 2024-07-07 ENCOUNTER — Other Ambulatory Visit (HOSPITAL_COMMUNITY): Payer: Self-pay

## 2024-07-07 VITALS — BP 164/90 | HR 70 | Wt 222.0 lb

## 2024-07-07 DIAGNOSIS — I110 Hypertensive heart disease with heart failure: Secondary | ICD-10-CM | POA: Insufficient documentation

## 2024-07-07 DIAGNOSIS — Z79899 Other long term (current) drug therapy: Secondary | ICD-10-CM | POA: Insufficient documentation

## 2024-07-07 DIAGNOSIS — Z8674 Personal history of sudden cardiac arrest: Secondary | ICD-10-CM | POA: Insufficient documentation

## 2024-07-07 DIAGNOSIS — I10 Essential (primary) hypertension: Secondary | ICD-10-CM

## 2024-07-07 DIAGNOSIS — Z5986 Financial insecurity: Secondary | ICD-10-CM | POA: Insufficient documentation

## 2024-07-07 DIAGNOSIS — I493 Ventricular premature depolarization: Secondary | ICD-10-CM | POA: Insufficient documentation

## 2024-07-07 DIAGNOSIS — I5032 Chronic diastolic (congestive) heart failure: Secondary | ICD-10-CM | POA: Insufficient documentation

## 2024-07-07 DIAGNOSIS — I5022 Chronic systolic (congestive) heart failure: Secondary | ICD-10-CM

## 2024-07-07 LAB — COMPREHENSIVE METABOLIC PANEL WITH GFR
ALT: 15 U/L (ref 0–44)
AST: 18 U/L (ref 15–41)
Albumin: 3.6 g/dL (ref 3.5–5.0)
Alkaline Phosphatase: 61 U/L (ref 38–126)
Anion gap: 9 (ref 5–15)
BUN: 18 mg/dL (ref 6–20)
CO2: 24 mmol/L (ref 22–32)
Calcium: 8.9 mg/dL (ref 8.9–10.3)
Chloride: 104 mmol/L (ref 98–111)
Creatinine, Ser: 1.2 mg/dL — ABNORMAL HIGH (ref 0.44–1.00)
GFR, Estimated: 55 mL/min — ABNORMAL LOW (ref >60)
Glucose, Bld: 104 mg/dL — ABNORMAL HIGH (ref 70–99)
Potassium: 4.1 mmol/L (ref 3.5–5.1)
Sodium: 137 mmol/L (ref 135–145)
Total Bilirubin: 0.3 mg/dL (ref 0.0–1.2)
Total Protein: 7.5 g/dL (ref 6.5–8.1)

## 2024-07-07 LAB — TSH: TSH: 7.43 u[IU]/mL — ABNORMAL HIGH (ref 0.350–4.500)

## 2024-07-07 LAB — T4, FREE: Free T4: 0.96 ng/dL (ref 0.61–1.12)

## 2024-07-07 MED ORDER — LOSARTAN POTASSIUM 50 MG PO TABS
50.0000 mg | ORAL_TABLET | Freq: Every day | ORAL | 6 refills | Status: AC
  Filled 2024-07-07: qty 30, 30d supply, fill #0
  Filled 2024-08-01: qty 30, 30d supply, fill #1
  Filled 2024-08-13: qty 30, 30d supply, fill #2
  Filled 2024-09-30: qty 30, 30d supply, fill #3
  Filled 2024-10-28: qty 30, 30d supply, fill #4
  Filled 2024-11-25: qty 30, 30d supply, fill #5

## 2024-07-07 NOTE — Progress Notes (Signed)
 H&V Care Navigation CSW Progress Note  Clinical Social Worker met with pt and pt spouse regarding lack of insurance.  Pt has been given CAFA multiple times in the past and has not completed.  Provided CAFA and explained importance of completing to assist with [facility] bills which pt reports as a high source of stress.  [employer] workers assisting pt with Emergency Medicaid application but still pending- this will only assist with inpatient hospital bills.    Pt encouraged to complete application then make appt with Eric the financial counselor with the [facility] to review and submit the application.  Pt and spouse report no other needs at this time.   SDOH Screenings   Food Insecurity: No Food Insecurity (03/21/2024)  Housing: Low Risk  (03/21/2024)  Transportation Needs: No Transportation Needs (03/21/2024)  Utilities: Not At Risk (03/21/2024)  Alcohol Screen: Low Risk  (06/10/2024)  Depression (PHQ2-9): Low Risk  (06/10/2024)  Financial Resource Strain: Medium Risk (07/07/2024)  Physical Activity: Insufficiently Active (06/10/2024)  Social Connections: Moderately Integrated (06/10/2024)  Stress: No Stress Concern Present (06/10/2024)  Tobacco Use: Low Risk  (07/07/2024)  Health Literacy: Inadequate Health Literacy (03/21/2024)   Mandy HILARIO Leech, LCSW Clinical Social Worker [facility] Desk#: [phone] Cell#: [phone]

## 2024-07-07 NOTE — Progress Notes (Signed)
 Called [employer] and provided d/c order for lifevest, they will contact pt to get equipment back, pt is aware

## 2024-07-07 NOTE — Patient Instructions (Addendum)
 Medication Changes:  START Losartan  50 mg Daily  Lab Work:  Labs done today, your results will be available in MyChart, we will contact you for abnormal readings.  Special Instructions // Education:  Do the following things EVERYDAY: Weigh yourself in the morning before breakfast. Write it down and keep it in a log. Take your medicines as prescribed Eat low salt foods--Limit salt (sodium) to 2000 mg per day.  Stay as active as you can everyday Limit all fluids for the day to less than 2 liters   Lifevest has been discontinued, you may remove it today, the [employer] company will contact you about returning the equipment to them   Follow-Up in: CONGRATULATIONS!!! You have graduated the Heart Failure Clinic, please follow-up with Dr Raford, her office will call you to schedule an appointment

## 2024-07-22 ENCOUNTER — Other Ambulatory Visit (HOSPITAL_BASED_OUTPATIENT_CLINIC_OR_DEPARTMENT_OTHER): Payer: Self-pay | Admitting: Cardiovascular Disease

## 2024-07-22 ENCOUNTER — Other Ambulatory Visit: Payer: Self-pay

## 2024-07-24 ENCOUNTER — Other Ambulatory Visit (HOSPITAL_COMMUNITY): Payer: Self-pay

## 2024-07-24 MED ORDER — HYDRALAZINE HCL 100 MG PO TABS
100.0000 mg | ORAL_TABLET | Freq: Three times a day (TID) | ORAL | 2 refills | Status: AC
  Filled 2024-07-24: qty 49, 17d supply, fill #0
  Filled 2024-07-24: qty 41, 13d supply, fill #0
  Filled 2024-09-02: qty 90, 30d supply, fill #1
  Filled 2024-09-30: qty 90, 30d supply, fill #2
  Filled 2024-10-28: qty 90, 30d supply, fill #3

## 2024-07-31 ENCOUNTER — Encounter: Payer: Self-pay | Admitting: Cardiology

## 2024-08-01 ENCOUNTER — Other Ambulatory Visit (HOSPITAL_COMMUNITY): Payer: Self-pay

## 2024-08-03 ENCOUNTER — Other Ambulatory Visit (HOSPITAL_COMMUNITY): Payer: Self-pay

## 2024-08-13 ENCOUNTER — Other Ambulatory Visit (HOSPITAL_COMMUNITY): Payer: Self-pay

## 2024-08-25 ENCOUNTER — Other Ambulatory Visit (HOSPITAL_COMMUNITY): Payer: Self-pay

## 2024-09-02 ENCOUNTER — Other Ambulatory Visit (HOSPITAL_COMMUNITY): Payer: Self-pay

## 2024-09-19 ENCOUNTER — Other Ambulatory Visit: Payer: Self-pay

## 2024-09-19 ENCOUNTER — Other Ambulatory Visit (HOSPITAL_COMMUNITY): Payer: Self-pay

## 2024-09-19 ENCOUNTER — Other Ambulatory Visit (HOSPITAL_COMMUNITY): Payer: Self-pay | Admitting: Family Medicine

## 2024-09-22 ENCOUNTER — Other Ambulatory Visit (HOSPITAL_COMMUNITY): Payer: Self-pay

## 2024-09-22 ENCOUNTER — Other Ambulatory Visit (HOSPITAL_COMMUNITY): Payer: Self-pay | Admitting: Family Medicine

## 2024-09-23 ENCOUNTER — Other Ambulatory Visit (HOSPITAL_COMMUNITY): Payer: Self-pay

## 2024-09-23 MED ORDER — AMLODIPINE BESYLATE 10 MG PO TABS
10.0000 mg | ORAL_TABLET | Freq: Every day | ORAL | 8 refills | Status: AC
  Filled 2024-09-23: qty 30, 30d supply, fill #0
  Filled 2024-10-28: qty 30, 30d supply, fill #1
  Filled 2024-11-25: qty 30, 30d supply, fill #2

## 2024-09-30 ENCOUNTER — Other Ambulatory Visit (HOSPITAL_COMMUNITY): Payer: Self-pay

## 2024-10-16 ENCOUNTER — Other Ambulatory Visit (HOSPITAL_COMMUNITY): Payer: Self-pay | Admitting: Family Medicine

## 2024-10-16 DIAGNOSIS — I160 Hypertensive urgency: Secondary | ICD-10-CM

## 2024-10-17 ENCOUNTER — Other Ambulatory Visit (HOSPITAL_COMMUNITY): Payer: Self-pay

## 2024-10-17 ENCOUNTER — Other Ambulatory Visit: Payer: Self-pay

## 2024-10-17 MED ORDER — CARVEDILOL 12.5 MG PO TABS
12.5000 mg | ORAL_TABLET | Freq: Two times a day (BID) | ORAL | 8 refills | Status: AC
  Filled 2024-10-17: qty 60, 30d supply, fill #0
  Filled 2024-11-19: qty 60, 30d supply, fill #1

## 2024-10-24 ENCOUNTER — Other Ambulatory Visit (HOSPITAL_COMMUNITY): Payer: Self-pay

## 2024-10-24 ENCOUNTER — Encounter: Payer: Self-pay | Admitting: Family

## 2024-10-28 ENCOUNTER — Other Ambulatory Visit (HOSPITAL_COMMUNITY): Payer: Self-pay

## 2024-11-19 ENCOUNTER — Other Ambulatory Visit (HOSPITAL_COMMUNITY): Payer: Self-pay

## 2024-11-26 ENCOUNTER — Other Ambulatory Visit (HOSPITAL_COMMUNITY): Payer: Self-pay
